# Patient Record
Sex: Female | Born: 1955
Health system: Southern US, Community
[De-identification: ages and names within clinical notes are randomized; demographics above are authoritative.]

## PROBLEM LIST (undated history)

## (undated) DIAGNOSIS — M8580 Other specified disorders of bone density and structure, unspecified site: Secondary | ICD-10-CM

## (undated) DIAGNOSIS — R3129 Other microscopic hematuria: Secondary | ICD-10-CM

## (undated) DIAGNOSIS — E559 Vitamin D deficiency, unspecified: Secondary | ICD-10-CM

## (undated) DIAGNOSIS — Z9889 Other specified postprocedural states: Secondary | ICD-10-CM

## (undated) DIAGNOSIS — F419 Anxiety disorder, unspecified: Secondary | ICD-10-CM

## (undated) DIAGNOSIS — M12811 Other specific arthropathies, not elsewhere classified, right shoulder: Secondary | ICD-10-CM

## (undated) DIAGNOSIS — I10 Essential (primary) hypertension: Secondary | ICD-10-CM

## (undated) DIAGNOSIS — T8859XA Other complications of anesthesia, initial encounter: Secondary | ICD-10-CM

## (undated) DIAGNOSIS — M4310 Spondylolisthesis, site unspecified: Secondary | ICD-10-CM

## (undated) HISTORY — DX: Spondylolisthesis, site unspecified: M43.10

## (undated) HISTORY — DX: Other specific arthropathies, not elsewhere classified, right shoulder: M12.811

## (undated) HISTORY — DX: Vitamin D deficiency, unspecified: E55.9

## (undated) HISTORY — DX: Other specified disorders of bone density and structure, unspecified site: M85.80

## (undated) HISTORY — DX: Other microscopic hematuria: R31.29

---

## 1989-10-14 HISTORY — PX: TUBAL LIGATION: SHX77

## 1999-01-17 ENCOUNTER — Encounter: Admission: RE | Admit: 1999-01-17 | Discharge: 1999-04-13 | Payer: Self-pay | Admitting: Family Medicine

## 2001-11-19 ENCOUNTER — Encounter: Payer: Self-pay | Admitting: Otolaryngology

## 2001-11-19 ENCOUNTER — Encounter: Admission: RE | Admit: 2001-11-19 | Discharge: 2001-11-19 | Payer: Self-pay | Admitting: Otolaryngology

## 2001-11-26 ENCOUNTER — Other Ambulatory Visit: Admission: RE | Admit: 2001-11-26 | Discharge: 2001-11-26 | Payer: Self-pay | Admitting: Obstetrics and Gynecology

## 2003-01-17 ENCOUNTER — Other Ambulatory Visit: Admission: RE | Admit: 2003-01-17 | Discharge: 2003-01-17 | Payer: Self-pay | Admitting: Obstetrics and Gynecology

## 2003-08-15 DIAGNOSIS — R3129 Other microscopic hematuria: Secondary | ICD-10-CM

## 2003-08-15 HISTORY — DX: Other microscopic hematuria: R31.29

## 2003-11-10 ENCOUNTER — Other Ambulatory Visit: Admission: RE | Admit: 2003-11-10 | Discharge: 2003-11-10 | Payer: Self-pay | Admitting: Obstetrics and Gynecology

## 2005-08-15 ENCOUNTER — Other Ambulatory Visit: Admission: RE | Admit: 2005-08-15 | Discharge: 2005-08-15 | Payer: Self-pay | Admitting: Obstetrics and Gynecology

## 2005-10-04 ENCOUNTER — Ambulatory Visit (HOSPITAL_COMMUNITY): Admission: RE | Admit: 2005-10-04 | Discharge: 2005-10-04 | Payer: Self-pay | Admitting: Gastroenterology

## 2005-11-13 ENCOUNTER — Ambulatory Visit (HOSPITAL_COMMUNITY): Admission: RE | Admit: 2005-11-13 | Discharge: 2005-11-13 | Payer: Self-pay | Admitting: Sports Medicine

## 2005-12-12 HISTORY — PX: ROTATOR CUFF REPAIR: SHX139

## 2005-12-25 ENCOUNTER — Ambulatory Visit (HOSPITAL_BASED_OUTPATIENT_CLINIC_OR_DEPARTMENT_OTHER): Admission: RE | Admit: 2005-12-25 | Discharge: 2005-12-26 | Payer: Self-pay | Admitting: Orthopedic Surgery

## 2006-05-15 ENCOUNTER — Emergency Department (HOSPITAL_COMMUNITY): Admission: EM | Admit: 2006-05-15 | Discharge: 2006-05-15 | Payer: Self-pay | Admitting: Family Medicine

## 2006-08-18 ENCOUNTER — Other Ambulatory Visit: Admission: RE | Admit: 2006-08-18 | Discharge: 2006-08-18 | Payer: Self-pay | Admitting: Obstetrics & Gynecology

## 2006-11-07 ENCOUNTER — Emergency Department (HOSPITAL_COMMUNITY): Admission: EM | Admit: 2006-11-07 | Discharge: 2006-11-07 | Payer: Self-pay | Admitting: Family Medicine

## 2007-03-10 ENCOUNTER — Encounter: Admission: RE | Admit: 2007-03-10 | Discharge: 2007-05-07 | Payer: Self-pay | Admitting: Internal Medicine

## 2007-11-07 ENCOUNTER — Emergency Department (HOSPITAL_COMMUNITY): Admission: EM | Admit: 2007-11-07 | Discharge: 2007-11-07 | Payer: Self-pay | Admitting: Family Medicine

## 2007-11-30 ENCOUNTER — Other Ambulatory Visit: Admission: RE | Admit: 2007-11-30 | Discharge: 2007-11-30 | Payer: Self-pay | Admitting: Obstetrics and Gynecology

## 2007-12-02 ENCOUNTER — Ambulatory Visit (HOSPITAL_COMMUNITY): Admission: RE | Admit: 2007-12-02 | Discharge: 2007-12-02 | Payer: Self-pay | Admitting: Orthopedic Surgery

## 2008-11-23 ENCOUNTER — Emergency Department (HOSPITAL_COMMUNITY): Admission: EM | Admit: 2008-11-23 | Discharge: 2008-11-23 | Payer: Self-pay | Admitting: Family Medicine

## 2009-02-06 ENCOUNTER — Other Ambulatory Visit: Admission: RE | Admit: 2009-02-06 | Discharge: 2009-02-06 | Payer: Self-pay | Admitting: Obstetrics and Gynecology

## 2009-11-13 ENCOUNTER — Emergency Department (HOSPITAL_COMMUNITY): Admission: EM | Admit: 2009-11-13 | Discharge: 2009-11-13 | Payer: Self-pay | Admitting: Emergency Medicine

## 2010-12-30 LAB — URINE CULTURE: Colony Count: 25000

## 2010-12-30 LAB — POCT URINALYSIS DIP (DEVICE)
Bilirubin Urine: NEGATIVE
Glucose, UA: 100 mg/dL — AB
Ketones, ur: NEGATIVE mg/dL
Nitrite: POSITIVE — AB
Protein, ur: 30 mg/dL — AB
Specific Gravity, Urine: <=1.005 (ref 1.005–1.030)
Urobilinogen, UA: 1 mg/dL (ref 0.0–1.0)
pH: 6 (ref 5.0–8.0)

## 2011-03-01 NOTE — Op Note (Signed)
Lisa Benjamin, EBRAHIMI              ACCOUNT NO.:  [id number]   MEDICAL RECORD NO.:  [id number]          PATIENT TYPE:  AMB   LOCATION:  ENDO                         FACILITY:  [facility]   PHYSICIAN:  Anselmo Rod, M.D.  DATE OF BIRTH:  05-31-1956   DATE OF PROCEDURE:  10/04/2005  DATE OF DISCHARGE:  10/04/2005                                 OPERATIVE REPORT   PROCEDURE PERFORMED:  Screening colonoscopy.   ENDOSCOPIST:  Anselmo Rod, M.D.   INSTRUMENT USED:  Olympus video colonoscope.   INDICATION FOR PROCEDURE:  A 55 year old African-American female undergoing  screening colonoscopy to rule out colonic polyps, masses, etc.  The patient  has a questionable history of colon cancer in the maternal grandmother and  family history of breast cancer in a maternal aunt.   PREPROCEDURE PREPARATION:  Informed consent was procured from the patient.  The patient was fasted for four hours prior to the procedure and prepped  with OsmoPrep pills the night of and in the morning of the procedure.  The  risks and benefits of the procedure, including a 10% miss rate of cancer and  polyps, were discussed with the patient as well.   PREPROCEDURE PHYSICAL:  VITAL SIGNS:  The patient had stable vital signs.  NECK:  Supple.  CHEST:  Clear to auscultation.  S1, S2 regular.  ABDOMEN:  Soft with normal bowel sounds.   DESCRIPTION OF PROCEDURE:  The patient was placed in the left lateral  decubitus position and sedated with 75 mcg of fentanyl and 8 mg of Versed in  slow incremental doses.  Once the patient was adequately sedate and  maintained on low-flow oxygen and continuous cardiac monitoring, the Olympus  video colonoscope was advanced from the rectum to the cecum.  The  appendiceal orifice and the ileocecal valve were clearly visualized.  The  entire colonic mucosa appeared healthy with no evidence of diverticulosis,  masses or polyps.   IMPRESSION:  Normal colonoscopy up to the terminal ileum.   No masses, polyps  or diverticula were seen.  Retroflexion in the rectum revealed no  abnormality.   RECOMMENDATIONS:  1.  Continue a high-fiber diet with liberal fluid intake.  2.  A repeat colonoscopy has been recommended within the next five years      unless the patient develops any abnormal symptoms in the interim, in      which case she should contact the office immediately.  3.  Outpatient follow-up as the need arises in the future.      Anselmo Rod, M.D.  Electronically Signed     JNM/MEDQ  D:  10/07/2005  T:  10/08/2005  Job:  [id number]   cc:   Edwena Felty. Romine, M.D.  Fax: [phone]

## 2011-03-01 NOTE — Op Note (Signed)
NAMEMAELANI, YARBRO NO.:  [id number]   MEDICAL RECORD NO.:  [id number]          PATIENT TYPE:  AMB   LOCATION:  DSC                          FACILITY:  [facility]   PHYSICIAN:  Loreta Ave, M.D. 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   PROCEDURE:  Left shoulder exam, manipulation under anesthesia.  Arthroscopy  with debridement of intra- and extra-articular adhesions.  Debridement of  labral tear.  Acromioplasty with CA ligament release.  Excision of distal  clavicle.   SURGEON:  Loreta Ave, M.D.   ASSISTANT:  Zonia Kief, P.A.   ANESTHESIA:  General.   BLOOD LOSS:  Minimal.   SPECIMENS:  None.   CULTURES:  None.   COMPLICATIONS:  None.   DRESSING:  Soft compressive with shoulder immobilizer.   PROCEDURE:  The patient was brought to the operating room and placed on the  operating table in the supine position.  After adequate anesthesia had been  obtained, the left shoulder was examined.  A good 50% loss of motion in all  planes.  Dense adhesions.  With bringing her past 90 degrees of forward  flexion and abduction, a pronounced breaking of adhesions in a single  manner.  Once that was achieved, I had just about full motion.  Still a  little bit of tightness with rotation that was achieved by breaking up more  adhesions and then full rotation obtained.  Maintained a stable shoulder.   Placed in the beach-chair position on a shoulder positioner and prepped and  draped in the usual sterile fashion.  Three standard portals, anterior,  posterior and lateral.  Shoulder entered from posterior approach.  Arthroscope introduced.  Shoulder distended and inspected.  Intra-articular  adhesions, synovitis debrided.  Capsular tearing from manipulation debrided.  Articular cartilage only grade 1 to 2 changes.  Posterior labral tear  debrided.  Undersurface of rotator cuff and biceps tendon intact.  Cannula  redirected subacromially.  Type II to III acromion, obvious impingement and  subacromial adhesions.  Cuff debrided.  No full-thickness tears.  Adhesions  removed.  Acromioplasty __________ acromion with shaver and high-speed bur,  releasing the CA ligament with cautery.  Distal clavicle grade 3 and 4  changes.  Lateral centimeter resected.  Adequacy of decompression and  clavicle excision confirmed viewing from all portals.  Instruments and fluid  removed.  Portals, shoulder and bursa injected with Marcaine.  Portals with  4-0 nylon.  Sterile compression dressing applied.  Sling applied.  Anesthesia reversed.  Brought to recovery room.  Tolerated surgery well with  no complications.      Loreta Ave, M.D.  Electronically Signed     DFM/MEDQ  D:  12/25/2005  T:  12/26/2005  Job:  [id number] 

## 2011-09-30 ENCOUNTER — Ambulatory Visit
Admission: RE | Admit: 2011-09-30 | Discharge: 2011-09-30 | Disposition: A | Discharge status: Home / Self Care | Payer: BC Managed Care – PPO | Source: Ambulatory Visit | Attending: Family Medicine | Admitting: Family Medicine

## 2011-09-30 ENCOUNTER — Other Ambulatory Visit: Payer: Self-pay | Admitting: Family Medicine

## 2011-09-30 DIAGNOSIS — R059 Cough, unspecified: Secondary | ICD-10-CM

## 2011-09-30 DIAGNOSIS — R05 Cough: Secondary | ICD-10-CM

## 2012-12-02 ENCOUNTER — Emergency Department (HOSPITAL_COMMUNITY)
Admission: EM | Admit: 2012-12-02 | Discharge: 2012-12-02 | Disposition: A | Discharge status: Home / Self Care | Payer: 59 | Source: Home / Self Care

## 2012-12-02 ENCOUNTER — Encounter (HOSPITAL_COMMUNITY): Payer: Self-pay

## 2012-12-02 DIAGNOSIS — N390 Urinary tract infection, site not specified: Secondary | ICD-10-CM

## 2012-12-02 LAB — POCT URINALYSIS DIP (DEVICE)
Bilirubin Urine: NEGATIVE
Glucose, UA: NEGATIVE mg/dL
Ketones, ur: NEGATIVE mg/dL
Nitrite: NEGATIVE
Protein, ur: NEGATIVE mg/dL
Specific Gravity, Urine: 1.01 (ref 1.005–1.030)
Urobilinogen, UA: 0.2 mg/dL (ref 0.0–1.0)
pH: 7 (ref 5.0–8.0)

## 2012-12-02 MED ORDER — CEPHALEXIN 250 MG PO CAPS: 250.0000 mg | ORAL_CAPSULE | Freq: Four times a day (QID) | ORAL | Status: DC

## 2012-12-02 NOTE — ED Provider Notes (Signed)
History     CSN: [id number]  Arrival date & time 12/02/12  1106   First MD Initiated Contact with Patient 12/02/12 1146      Chief Complaint  Patient presents with  . Urinary Tract Infection    (Consider location/radiation/quality/duration/timing/severity/associated sxs/prior treatment) HPI Comments: 57 year old female complaining of "UTI symptoms" for 48 hours. Shuffling of urinary frequency, dysuria and upper left back pain. She denies nausea, vomiting, fever, abdominal pain or pain in the suprapubic area. She has a history of UTIs with frequency of 3-4 per year. However, since approximately 2011 her UTIs have decreased significantly and number.  Patient is a 57 y.o. female presenting with urinary tract infection.  Urinary Tract Infection    History reviewed. No pertinent past medical history.  History reviewed. No pertinent past surgical history.  History reviewed. No pertinent family history.  History  Substance Use Topics  . Smoking status: Not on file  . Smokeless tobacco: Not on file  . Alcohol Use: Not on file    OB History   Grav Para Term Preterm Abortions TAB SAB Ect Mult Living                  Review of Systems  Constitutional: Negative.   HENT: Negative.   Respiratory: Negative.   Gastrointestinal: Negative.   Genitourinary: Positive for dysuria, frequency and flank pain. Negative for vaginal bleeding, vaginal discharge, difficulty urinating, vaginal pain and pelvic pain.  Musculoskeletal: Negative.   Psychiatric/Behavioral: Negative.     Allergies  Review of patient's allergies indicates no known allergies.  Home Medications   Current Outpatient Rx  Name  Route  Sig  Dispense  Refill  . estrogen, conjugated,-medroxyprogesterone (PREMPRO) 0.625-2.5 MG per tablet   Oral   Take 1 tablet by mouth daily.         . cephALEXin (KEFLEX) 250 MG capsule   Oral   Take 1 capsule (250 mg total) by mouth 4 (four) times daily.   28 capsule   0      BP 131/59  Pulse 58  Temp(Src) 98 F (36.7 C) (Oral)  Resp 20  SpO2 100%  Physical Exam  Nursing note and vitals reviewed. Constitutional: She is oriented to person, place, and time. She appears well-developed and well-nourished. No distress.  Eyes: EOM are normal.  Neck: Normal range of motion. Neck supple.  Cardiovascular: Normal rate and regular rhythm.  Exam reveals gallop.   S4 gallop. States she has been told of a heart murmur and her physician is aware  Pulmonary/Chest: Effort normal and breath sounds normal. No respiratory distress. She has no wheezes. She has no rales.  Abdominal: Soft. She exhibits no distension. There is no tenderness. There is no rebound.  Musculoskeletal: Normal range of motion. She exhibits no edema.  Neurological: She is oriented to person, place, and time. She exhibits normal muscle tone.  Skin: Skin is warm and dry.  Psychiatric: She has a normal mood and affect.    ED Course  Procedures (including critical care time)  Labs Reviewed  POCT URINALYSIS DIP (DEVICE) - Abnormal; Notable for the following:    Hgb urine dipstick MODERATE (*)    Leukocytes, UA LARGE (*)    All other components within normal limits   No results found.   1. UTI (lower urinary tract infection)       MDM  Keflex 250 mg 4 times a day for 7 days Drink plenty of fluids stay well hydrated Pyridium, OTC one  to 2 tablets 3 times a day when necessary urinary discomfort For any worsening, new symptoms or problems such as fever, increased pain, chills, vomiting need to go to the emergency department. If no improvement may return Culture the urine Results for orders placed during the hospital encounter of 12/02/12  POCT URINALYSIS DIP (DEVICE)      Result Value Range   Glucose, UA NEGATIVE  NEGATIVE mg/dL   Bilirubin Urine NEGATIVE  NEGATIVE   Ketones, ur NEGATIVE  NEGATIVE mg/dL   Specific Gravity, Urine 1.010  1.005 - 1.030   Hgb urine dipstick MODERATE (*)  NEGATIVE   pH 7.0  5.0 - 8.0   Protein, ur NEGATIVE  NEGATIVE mg/dL   Urobilinogen, UA 0.2  0.0 - 1.0 mg/dL   Nitrite NEGATIVE  NEGATIVE   Leukocytes, UA LARGE (*) NEGATIVE           Hayden Rasmussen, NP 12/02/12 1442  Hayden Rasmussen, NP 12/02/12 1443

## 2012-12-02 NOTE — ED Notes (Signed)
C/o syx of UTI x 1 week; history of same in past; minimal relief w azo standard, vitamin c

## 2012-12-03 NOTE — ED Provider Notes (Signed)
Medical screening examination/treatment/procedure(s) were performed by non-physician practitioner and as supervising physician I was immediately available for consultation/collaboration.   [facility]; MD  Sharin Grave, MD 12/03/12 [phone]

## 2013-08-11 ENCOUNTER — Ambulatory Visit: Payer: PRIVATE HEALTH INSURANCE | Attending: Family Medicine | Admitting: Physical Therapy

## 2013-08-11 DIAGNOSIS — R293 Abnormal posture: Secondary | ICD-10-CM | POA: Insufficient documentation

## 2013-08-11 DIAGNOSIS — M25519 Pain in unspecified shoulder: Secondary | ICD-10-CM | POA: Insufficient documentation

## 2013-08-11 DIAGNOSIS — IMO0001 Reserved for inherently not codable concepts without codable children: Secondary | ICD-10-CM | POA: Insufficient documentation

## 2013-09-02 ENCOUNTER — Ambulatory Visit: Payer: PRIVATE HEALTH INSURANCE | Attending: Family Medicine | Admitting: Rehabilitation

## 2013-09-02 DIAGNOSIS — M25519 Pain in unspecified shoulder: Secondary | ICD-10-CM | POA: Insufficient documentation

## 2013-09-02 DIAGNOSIS — IMO0001 Reserved for inherently not codable concepts without codable children: Secondary | ICD-10-CM | POA: Insufficient documentation

## 2013-09-02 DIAGNOSIS — R293 Abnormal posture: Secondary | ICD-10-CM | POA: Insufficient documentation

## 2013-09-06 ENCOUNTER — Ambulatory Visit: Payer: PRIVATE HEALTH INSURANCE | Admitting: Physical Therapy

## 2013-09-08 ENCOUNTER — Ambulatory Visit: Payer: PRIVATE HEALTH INSURANCE | Admitting: Physical Therapy

## 2013-09-27 ENCOUNTER — Ambulatory Visit (INDEPENDENT_AMBULATORY_CARE_PROVIDER_SITE_OTHER): Payer: 59 | Admitting: Nurse Practitioner

## 2013-09-27 ENCOUNTER — Encounter: Payer: Self-pay | Admitting: Nurse Practitioner

## 2013-09-27 VITALS — BP 126/74 | HR 64 | Ht 63.0 in | Wt 175.0 lb

## 2013-09-27 DIAGNOSIS — Z01419 Encounter for gynecological examination (general) (routine) without abnormal findings: Secondary | ICD-10-CM

## 2013-09-27 DIAGNOSIS — Z0000 Encounter for general adult medical examination without abnormal findings: Secondary | ICD-10-CM

## 2013-09-27 DIAGNOSIS — E559 Vitamin D deficiency, unspecified: Secondary | ICD-10-CM

## 2013-09-27 LAB — POCT URINALYSIS DIPSTICK
Bilirubin, UA: NEGATIVE
Glucose, UA: NEGATIVE
Ketones, UA: NEGATIVE
Leukocytes, UA: NEGATIVE
Nitrite, UA: NEGATIVE
Protein, UA: NEGATIVE
Urobilinogen, UA: NEGATIVE
pH, UA: 5

## 2013-09-27 LAB — HEMOGLOBIN, FINGERSTICK: Hemoglobin, fingerstick: 12 g/dL (ref 12.0–16.0)

## 2013-09-27 MED ORDER — CONJ ESTROG-MEDROXYPROGEST ACE 0.625-2.5 MG PO TABS: 1.0000 | ORAL_TABLET | Freq: Every day | ORAL | Status: DC

## 2013-09-27 MED ORDER — ERGOCALCIFEROL 1.25 MG (50000 UT) PO CAPS: 50000.0000 [IU] | ORAL_CAPSULE | ORAL | Status: DC

## 2013-09-27 NOTE — Progress Notes (Signed)
Patient ID: Lisa Benjamin, female   DOB: 11/19/1955, 57 y.o.   MRN: [id number] 57 y.o. G2P2002 Married African American Fe here for annual exam.    No LMP recorded. Patient is postmenopausal.          Sexually active: yes  The current method of family planning is post menopausal status.    Exercising: no  The patient does not participate in regular exercise at present. Smoker:  no  Health Maintenance: Pap:  06/07/13, WNL with negative HR HPV MMG: 09/20/13, benign Colonoscopy:  2006, repeat in 10 years IFOB kit BMD: Never TDaP:  08/2004 Labs:  HB:  12.0 Urine:  small RBC, 5.0 pH   reports that she has never smoked. She has never used smokeless tobacco. She reports that she does not drink alcohol or use illicit drugs.  Past Medical History  Diagnosis Date  . Microscopic hematuria     negative work up with Dr. Isabel Caprice    Past Surgical History  Procedure Laterality Date  . Cesarean section      x2  . Tubal ligation Bilateral 1991  . Rotator cuff repair Left 12/2005    Current Outpatient Prescriptions  Medication Sig Dispense Refill  . calcium carbonate (TUMS EX) 750 MG chewable tablet Chew 1 tablet by mouth as needed for heartburn.      . ergocalciferol (VITAMIN D2) 50000 UNITS capsule Take 50,000 Units by mouth once a week.      . estrogen, conjugated,-medroxyprogesterone (PREMPRO) 0.625-2.5 MG per tablet Take 1 tablet by mouth daily.      . Multiple Vitamin (MULTIVITAMIN) tablet Take 1 tablet by mouth daily.       No current facility-administered medications for this visit.    Family History  Problem Relation Age of Onset  . Breast cancer Mother 61  . Diabetes Mother   . Heart failure Father     CHF  . COPD Father     ROS:  Pertinent items are noted in HPI.  Otherwise, a comprehensive ROS was negative.  Exam:   BP 126/74  Pulse 64  Ht 5\' 3"  (1.6 m)  Wt 175 lb (79.379 kg)  BMI 31.01 kg/m2 Height: 5\' 3"  (160 cm)  Ht Readings from Last 3 Encounters:  09/27/13 5\' 3"   (1.6 m)    General appearance: alert, cooperative and appears stated age Head: Normocephalic, without obvious abnormality, atraumatic Neck: no adenopathy, supple, symmetrical, trachea midline and thyroid normal to inspection and palpation Lungs: clear to auscultation bilaterally Breasts: normal appearance, no masses or tenderness Heart: regular rate and rhythm Abdomen: soft, non-tender; no masses,  no organomegaly Extremities: extremities normal, atraumatic, no cyanosis or edema Skin: Skin color, texture, turgor normal. No rashes or lesions Lymph nodes: Cervical, supraclavicular, and axillary nodes normal. No abnormal inguinal nodes palpated Neurologic: Grossly normal   Pelvic: External genitalia:  no lesions              Urethra:  normal appearing urethra with no masses, tenderness or lesions              Bartholin's and Skene's: normal                 Vagina: normal appearing vagina with normal color and discharge, no lesions              Cervix: anteverted              Pap taken: no Bimanual Exam:  Uterus:  normal size, contour, position, consistency,  mobility, non-tender              Adnexa: no mass, fullness, tenderness               Rectovaginal: Confirms               Anus:  normal sphincter tone, no lesions  A:  Well Woman with normal exam  Postmenopausal on HRT 06/2010  History of Vitamin D deficiency  P:   Pap smear as per guidelines   Mammogram due 12/15  Refill on HRT - Prempro for a year  Refill on Vit D for a year - follow with labs  IFOB given  Counseled on breast self exam, mammography screening, use and side effects of HRT, adequate intake of calcium and vitamin D, diet and exercise return annually or prn  An After Visit Summary was printed and given to the patient.

## 2013-09-27 NOTE — Patient Instructions (Signed)

## 2013-09-28 LAB — VITAMIN D 25 HYDROXY (VIT D DEFICIENCY, FRACTURES): Vit D, 25-Hydroxy: 57 ng/mL (ref 30–89)

## 2013-09-28 NOTE — Progress Notes (Signed)
Reviewed personally.  M. Suzanne Elimelech Houseman, MD.  

## 2013-09-29 ENCOUNTER — Telehealth: Payer: Self-pay | Admitting: *Deleted

## 2013-09-29 NOTE — Telephone Encounter (Signed)
Message copied by Luisa Dago on Wed Sep 29, 2013  9:56 AM ------      Message from: Ria Comment R      Created: Tue Sep 28, 2013  8:22 AM       Have her to reduce Vit D to every there wek.  If she goes to OTC she will go way down, as she has done this in the past ------

## 2013-09-29 NOTE — Telephone Encounter (Signed)
I have attempted to contact this patient by phone with the following results: left message to return my call on answering machine (mobile).  

## 2013-09-30 NOTE — Telephone Encounter (Signed)
Pt notified in result note.  

## 2014-06-02 ENCOUNTER — Emergency Department (HOSPITAL_COMMUNITY)
Admission: EM | Admit: 2014-06-02 | Discharge: 2014-06-02 | Disposition: A | Discharge status: Home / Self Care | Payer: 59 | Source: Home / Self Care | Attending: Emergency Medicine | Admitting: Emergency Medicine

## 2014-06-02 ENCOUNTER — Encounter (HOSPITAL_COMMUNITY): Payer: Self-pay | Admitting: Emergency Medicine

## 2014-06-02 DIAGNOSIS — M6740 Ganglion, unspecified site: Secondary | ICD-10-CM

## 2014-06-02 DIAGNOSIS — M67432 Ganglion, left wrist: Secondary | ICD-10-CM

## 2014-06-02 DIAGNOSIS — M25539 Pain in unspecified wrist: Secondary | ICD-10-CM

## 2014-06-02 DIAGNOSIS — M25532 Pain in left wrist: Secondary | ICD-10-CM

## 2014-06-02 MED ORDER — PREDNISONE 10 MG PO TABS: ORAL_TABLET | ORAL | Status: DC

## 2014-06-02 MED ORDER — DICLOFENAC EPOLAMINE 1.3 % TD PTCH: 1.0000 | MEDICATED_PATCH | Freq: Two times a day (BID) | TRANSDERMAL | Status: DC

## 2014-06-02 NOTE — ED Provider Notes (Signed)
CSN: [id number]     Arrival date & time 06/02/14  2010 History   First MD Initiated Contact with Patient 06/02/14 2050     Chief Complaint  Patient presents with  . Wrist Pain   (Consider location/radiation/quality/duration/timing/severity/associated sxs/prior Treatment) HPI Comments: 58 year old female presents complaining of left wrist pain. Starting this morning she began to have pain in her left wrist and she has noticed some slight swelling on the dorsum of the wrist. Throughout the day at work pain got slightly more intense. She denies any injury and does not know what she may have done to precipitate this. She denies numbness or redness, or any systemic symptoms. No personal or family history of arthritis. She has never had this before.  Patient is a 58 y.o. female presenting with wrist pain.  Wrist Pain    Past Medical History  Diagnosis Date  . Microscopic hematuria 11/04    negative work up with Dr. Isabel Caprice   Past Surgical History  Procedure Laterality Date  . Cesarean section      x2  . Tubal ligation Bilateral 1991  . Rotator cuff repair Left 12/2005   Family History  Problem Relation Age of Onset  . Breast cancer Mother 12  . Diabetes Mother   . Heart failure Father     CHF  . COPD Father    History  Substance Use Topics  . Smoking status: Never Smoker   . Smokeless tobacco: Never Used  . Alcohol Use: No   OB History   Grav Para Term Preterm Abortions TAB SAB Ect Mult Living   2 2 2       2      Review of Systems  Musculoskeletal: Positive for arthralgias and joint swelling.  All other systems reviewed and are negative.   Allergies  Review of patient's allergies indicates no known allergies.  Home Medications   Prior to Admission medications   Medication Sig Start Date End Date Taking? Authorizing Provider  calcium carbonate (TUMS EX) 750 MG chewable tablet Chew 1 tablet by mouth as needed for heartburn.    Historical Provider, MD  diclofenac  (FLECTOR) 1.3 % PTCH Place 1 patch onto the skin 2 (two) times daily. 06/02/14   Graylon Good, PA-C  ergocalciferol (VITAMIN D2) 50000 UNITS capsule Take 1 capsule (50,000 Units total) by mouth once a week. 09/27/13   Lauro Franklin, FNP  estrogen, conjugated,-medroxyprogesterone (PREMPRO) 0.625-2.5 MG per tablet Take 1 tablet by mouth daily. 09/27/13   Lauro Franklin, FNP  Multiple Vitamin (MULTIVITAMIN) tablet Take 1 tablet by mouth daily.    Historical Provider, MD  predniSONE (DELTASONE) 10 MG tablet 4 tabs PO QD for 4 days; 3 tabs PO QD for 3 days; 2 tabs PO QD for 2 days; 1 tab PO QD for 1 day 06/02/14   Graylon Good, PA-C   BP 134/61  Pulse 66  Temp(Src) 97.2 F (36.2 C) (Oral)  Resp 14  SpO2 100% Physical Exam  Nursing note and vitals reviewed. Constitutional: She is oriented to person, place, and time. Vital signs are normal. She appears well-developed and well-nourished. No distress.  HENT:  Head: Normocephalic and atraumatic.  Cardiovascular:  Pulses:      Radial pulses are 2+ on the right side, and 2+ on the left side.  Pulmonary/Chest: Effort normal. No respiratory distress.  Musculoskeletal:       Left wrist: She exhibits decreased range of motion (decreased extension), tenderness (Dorsum of the wrist) and swelling (  Focal area of swelling/cyst over the dorsum of the wrist). She exhibits no bony tenderness, no effusion, no crepitus, no deformity and no laceration.  Grip strength equal bilaterally. Negative Finkelstein's test  Neurological: She is alert and oriented to person, place, and time. She has normal strength. Coordination normal.  Skin: Skin is warm and dry. No rash noted. She is not diaphoretic.  Psychiatric: She has a normal mood and affect. Judgment normal.    ED Course  Procedures (including critical care time) Labs Review Labs Reviewed - No data to display  Imaging Review No results found.   MDM   1. Wrist pain, acute, left   2.  Ganglion cyst of wrist, left    This is consistent with an inflamed ganglion cyst. No indication of arthritis or infection. No injury to necessitate x-rays to evaluate for bony injury. We'll treat with bracing, oral prednisone, diclofenac patch. She will followup with her orthopedist if no improvement in 2 weeks.   Meds ordered this encounter  Medications  . predniSONE (DELTASONE) 10 MG tablet    Sig: 4 tabs PO QD for 4 days; 3 tabs PO QD for 3 days; 2 tabs PO QD for 2 days; 1 tab PO QD for 1 day    Dispense:  30 tablet    Refill:  0    Order Specific Question:  Supervising Provider    Answer:  Lorenz CoasterKELLER, DAVID C [id number]  . diclofenac (FLECTOR) 1.3 % PTCH    Sig: Place 1 patch onto the skin 2 (two) times daily.    Dispense:  30 patch    Refill:  0    Order Specific Question:  Supervising Provider    Answer:  Lorenz CoasterKELLER, DAVID C [[id number]       Graylon GoodZachary H Quiara Killian, PA-C 06/02/14 2153

## 2014-06-02 NOTE — ED Provider Notes (Signed)
Medical screening examination/treatment/procedure(s) were performed by non-physician practitioner and as supervising physician I was immediately available for consultation/collaboration.  Shayann Garbutt, M.D.  Zamier Eggebrecht C Niya Behler, MD 06/02/14 2254 

## 2014-06-02 NOTE — Discharge Instructions (Signed)
Wrist Pain Wrist injuries are frequent in adults and children. A sprain is an injury to the ligaments that hold your bones together. A strain is an injury to muscle or muscle cord-like structures (tendons) from stretching or pulling. Generally, when wrists are moderately tender to touch following a fall or injury, a break in the bone (fracture) may be present. Most wrist sprains or strains are better in 3 to 5 days, but complete healing may take several weeks. HOME CARE INSTRUCTIONS   Put ice on the injured area.  Put ice in a plastic bag.  Place a towel between your skin and the bag.  Leave the ice on for 15-20 minutes, 3-4 times a day, for the first 2 days, or as directed by your health care provider.  Keep your arm raised above the level of your heart whenever possible to reduce swelling and pain.  Rest the injured area for at least 48 hours or as directed by your health care provider.  If a splint or elastic bandage has been applied, use it for as long as directed by your health care provider or until seen by a health care provider for a follow-up exam.  Only take over-the-counter or prescription medicines for pain, discomfort, or fever as directed by your health care provider.  Keep all follow-up appointments. You may need to follow up with a specialist or have follow-up X-rays. Improvement in pain level is not a guarantee that you did not fracture a bone in your wrist. The only way to determine whether or not you have a broken bone is by X-ray. SEEK IMMEDIATE MEDICAL CARE IF:   Your fingers are swollen, very red, white, or cold and blue.  Your fingers are numb or tingling.  You have increasing pain.  You have difficulty moving your fingers. MAKE SURE YOU:   Understand these instructions.  Will watch your condition.  Will get help right away if you are not doing well or get worse. Document Released: 07/10/2005 Document Revised: 10/05/2013 Document Reviewed:  11/21/2010 [facility] Patient Information 2015 [place], [place]. This information is not intended to replace advice given to you by your health care provider. Make sure you discuss any questions you have with your health care provider.  Ganglion Cyst A ganglion cyst is a noncancerous, fluid-filled lump that occurs near joints or tendons. The ganglion cyst grows out of a joint or the lining of a tendon. It most often develops in the hand or wrist but can also develop in the shoulder, elbow, hip, knee, ankle, or foot. The round or oval ganglion can be pea sized or larger than a grape. Increased activity may enlarge the size of the cyst because more fluid starts to build up.  CAUSES  It is not completely known what causes a ganglion cyst to grow. However, it may be related to:  Inflammation or irritation around the joint.  An injury.  Repetitive movements or overuse.  Arthritis. SYMPTOMS  A lump most often appears in the hand or wrist, but can occur in other areas of the body. Generally, the lump is painless without other symptoms. However, sometimes pain can be felt during activity or when pressure is applied to the lump. The lump may even be tender to the touch. Tingling, pain, numbness, or muscle weakness can occur if the ganglion cyst presses on a nerve. Your grip may be weak and you may have less movement in your joints.  DIAGNOSIS  Ganglion cysts are most often diagnosed based on  a physical exam, noting where the cyst is and how it looks. Your caregiver will feel the lump and may shine a light alongside it. If it is a ganglion, a light often shines through it. Your caregiver may order an X-ray, ultrasound, or MRI to rule out other conditions. TREATMENT  Ganglions usually go away on their own without treatment. If pain or other symptoms are involved, treatment may be needed. Treatment is also needed if the ganglion limits your movement or if it gets infected. Treatment options include:  Wearing  a wrist or finger brace or splint.  Taking anti-inflammatory medicine.  Draining fluid from the lump with a needle (aspiration).  Injecting a steroid into the joint.  Surgery to remove the ganglion cyst and its stalk that is attached to the joint or tendon. However, ganglion cysts can grow back. HOME CARE INSTRUCTIONS   Do not press on the ganglion, poke it with a needle, or hit it with a heavy object. You may rub the lump gently and often. Sometimes fluid moves out of the cyst.  Only take medicines as directed by your caregiver.  Wear your brace or splint as directed by your caregiver. SEEK MEDICAL CARE IF:   Your ganglion becomes larger or more painful.  You have increased redness, red streaks, or swelling.  You have pus coming from the lump.  You have weakness or numbness in the affected area. MAKE SURE YOU:   Understand these instructions.  Will watch your condition.  Will get help right away if you are not doing well or get worse. Document Released: 09/27/2000 Document Revised: 06/24/2012 Document Reviewed: 11/24/2007 [facility] Patient Information 2015 [place]. This information is not intended to replace advice given to you by your health care provider. Make sure you discuss any questions you have with your health care provider.

## 2014-06-02 NOTE — ED Notes (Signed)
C/o left wrist pain States she has a knot on top of wrist which was noticed this afternoon States wrist is swollen

## 2014-06-25 ENCOUNTER — Emergency Department (HOSPITAL_COMMUNITY)
Admission: EM | Admit: 2014-06-25 | Discharge: 2014-06-25 | Disposition: A | Discharge status: Home / Self Care | Payer: 59 | Source: Home / Self Care | Attending: Family Medicine | Admitting: Family Medicine

## 2014-06-25 ENCOUNTER — Encounter (HOSPITAL_COMMUNITY): Payer: Self-pay | Admitting: Emergency Medicine

## 2014-06-25 DIAGNOSIS — J020 Streptococcal pharyngitis: Secondary | ICD-10-CM

## 2014-06-25 MED ORDER — AMOXICILLIN 500 MG PO CAPS: 500.0000 mg | ORAL_CAPSULE | Freq: Three times a day (TID) | ORAL | Status: DC

## 2014-06-25 MED ORDER — BENZOCAINE 20 % MT SOLN
OROMUCOSAL | Status: AC
  Filled 2014-06-25: qty 57

## 2014-06-25 NOTE — Discharge Instructions (Signed)
Drink lots of fluids, take all of medicine, use lozenges as needed.return if needed °

## 2014-06-25 NOTE — ED Provider Notes (Signed)
CSN: [id number]     Arrival date & time 06/25/14  1627 History   First MD Initiated Contact with Patient 06/25/14 1651     Chief Complaint  Patient presents with  . Sore Throat   (Consider location/radiation/quality/duration/timing/severity/associated sxs/prior Treatment) Patient is a 58 y.o. female presenting with pharyngitis. The history is provided by the patient.  Sore Throat This is a new problem. The current episode started more than 2 days ago (seen by lmd on thurs with neg strep test, culture pending, sx getting worse.). The problem has been gradually worsening. Associated symptoms comments: Right ear pain.. The symptoms are aggravated by swallowing.    Past Medical History  Diagnosis Date  . Microscopic hematuria 11/04    negative work up with Dr. Isabel Caprice   Past Surgical History  Procedure Laterality Date  . Cesarean section      x2  . Tubal ligation Bilateral 1991  . Rotator cuff repair Left 12/2005   Family History  Problem Relation Age of Onset  . Breast cancer Mother 10  . Diabetes Mother   . Heart failure Father     CHF  . COPD Father    History  Substance Use Topics  . Smoking status: Never Smoker   . Smokeless tobacco: Never Used  . Alcohol Use: No   OB History   Grav Para Term Preterm Abortions TAB SAB Ect Mult Living   Review of Systems  Constitutional: Negative.   HENT: Positive for ear pain and sore throat. Negative for ear discharge.     Allergies  Review of patient's allergies indicates no known allergies.  Home Medications   Prior to Admission medications   Medication Sig Start Date End Date Taking? Authorizing Provider  diclofenac (FLECTOR) 1.3 % PTCH Place 1 patch onto the skin 2 (two) times daily. 06/02/14  Yes Graylon Good, PA-C  ergocalciferol (VITAMIN D2) 50000 UNITS capsule Take 1 capsule (50,000 Units total) by mouth once a week. 09/27/13  Yes Patricia Rolen-Grubb, FNP  amoxicillin (AMOXIL) 500 MG capsule Take  1 capsule (500 mg total) by mouth 3 (three) times daily. 06/25/14   Linna Hoff, MD  calcium carbonate (TUMS EX) 750 MG chewable tablet Chew 1 tablet by mouth as needed for heartburn.    Historical Provider, MD  estrogen, conjugated,-medroxyprogesterone (PREMPRO) 0.625-2.5 MG per tablet Take 1 tablet by mouth daily. 09/27/13   Lauro Franklin, FNP  Multiple Vitamin (MULTIVITAMIN) tablet Take 1 tablet by mouth daily.    Historical Provider, MD  predniSONE (DELTASONE) 10 MG tablet 4 tabs PO QD for 4 days; 3 tabs PO QD for 3 days; 2 tabs PO QD for 2 days; 1 tab PO QD for 1 day 06/02/14   Graylon Good, PA-C   BP 143/73  Pulse 83  Temp(Src) 100.1 F (37.8 C) (Oral)  Resp 16  SpO2 98% Physical Exam  Nursing note and vitals reviewed. Constitutional: She is oriented to person, place, and time. She appears well-developed and well-nourished.  HENT:  Right Ear: External ear normal.  Left Ear: External ear normal.  Mouth/Throat: Uvula is midline and mucous membranes are normal. Oropharyngeal exudate and posterior oropharyngeal erythema present.  Eyes: Conjunctivae are normal. Pupils are equal, round, and reactive to light.  Neck: Normal range of motion. Neck supple.  Lymphadenopathy:    She has cervical adenopathy.  Neurological: She is alert and oriented to person, place, and  time.  Skin: Skin is warm and dry.    ED Course  Procedures (including critical care time) Labs Review Labs Reviewed - No data to display  Imaging Review No results found.   MDM   1. Strep sore throat        Linna Hoff, MD 06/25/14 [phone]

## 2014-06-25 NOTE — ED Notes (Signed)
C/o  Sore throat.  Ear ache.  Headache.  Pt has used ibuprofen and throat drops with no relief.  Denies n/v/d

## 2014-08-15 ENCOUNTER — Encounter (HOSPITAL_COMMUNITY): Payer: Self-pay | Admitting: Emergency Medicine

## 2014-10-04 ENCOUNTER — Encounter: Payer: Self-pay | Admitting: Nurse Practitioner

## 2014-10-04 ENCOUNTER — Ambulatory Visit (INDEPENDENT_AMBULATORY_CARE_PROVIDER_SITE_OTHER): Payer: 59 | Admitting: Nurse Practitioner

## 2014-10-04 VITALS — BP 122/78 | HR 60 | Ht 62.25 in | Wt 180.0 lb

## 2014-10-04 DIAGNOSIS — Z01419 Encounter for gynecological examination (general) (routine) without abnormal findings: Secondary | ICD-10-CM

## 2014-10-04 DIAGNOSIS — Z0000 Encounter for general adult medical examination without abnormal findings: Secondary | ICD-10-CM

## 2014-10-04 LAB — POCT URINALYSIS DIPSTICK
Bilirubin, UA: NEGATIVE
Glucose, UA: NEGATIVE
Ketones, UA: NEGATIVE
Nitrite, UA: NEGATIVE
Protein, UA: NEGATIVE
Urobilinogen, UA: NEGATIVE
pH, UA: 6

## 2014-10-04 NOTE — Patient Instructions (Signed)

## 2014-10-04 NOTE — Progress Notes (Signed)
Patient ID: Lisa Benjamin, female   DOB: [birth year], 58 y.o.   MRN: [id number] 58 y.o. G2P2002 Married African American Fe here for annual exam.  No new diagnosis.  Stopped HRT in January 2015 secondary to increase in varicose veins and concerns about DVT.  Wears support sock daily.  Patient's last menstrual period was 06/14/2010 (approximate).  Pt is postmenopausal.      Sexually active: yes  The current method of family planning is post menopausal status.  Exercising: no The patient does not participate in regular exercise at present. Smoker: no  Health Maintenance: Pap: 06/07/13, WNL with negative HR HPV MMG: 09/21/14, [facility], normal per patient Colonoscopy: 2006, repeat in 10 years BMD: Never TDaP: 08/2004 Labs:  PCP, will have PCP draw Vit D also  Urine:  Small RBC, trace leuk's   reports that she has never smoked. She has never used smokeless tobacco. She reports that she does not drink alcohol or use illicit drugs.  Past Medical History  Diagnosis Date  . Microscopic hematuria 11/04    negative work up with Dr. Isabel CapriceGrapey    Past Surgical History  Procedure Laterality Date  . Cesarean section      x2  . Tubal ligation Bilateral 1991  . Rotator cuff repair Left 12/2005    Current Outpatient Prescriptions  Medication Sig Dispense Refill  . ergocalciferol (VITAMIN D2) 50000 UNITS capsule Take 1 capsule (50,000 Units total) by mouth once a week. 30 capsule 3  . Multiple Vitamin (MULTIVITAMIN) tablet Take 1 tablet by mouth daily.     No current facility-administered medications for this visit.    Family History  Problem Relation Age of Onset  . Breast cancer Mother 3077  . Diabetes Mother   . Heart failure Father     CHF  . COPD Father     ROS:  Pertinent items are noted in HPI.  Otherwise, a comprehensive ROS was negative.  Exam:   BP 122/78 mmHg  Pulse 60  Ht 5' 2.25" (1.581 m)  Wt 180 lb (81.647 kg)  BMI 32.66 kg/m2  LMP 06/14/2010 (Approximate) Height:  5' 2.25" (158.1 cm)  Ht Readings from Last 3 Encounters:  10/04/14 5' 2.25" (1.581 m)  09/27/13 5\' 3"  (1.6 m)    General appearance: alert, cooperative and appears stated age Head: Normocephalic, without obvious abnormality, atraumatic Neck: no adenopathy, supple, symmetrical, trachea midline and thyroid normal to inspection and palpation Lungs: clear to auscultation bilaterally Breasts: normal appearance, no masses or tenderness Heart: regular rate and rhythm Abdomen: soft, non-tender; no masses,  no organomegaly Extremities: extremities normal, atraumatic, no cyanosis or edema Skin: Skin color, texture, turgor normal. No rashes or lesions Lymph nodes: Cervical, supraclavicular, and axillary nodes normal. No abnormal inguinal nodes palpated Neurologic: Grossly normal   Pelvic: External genitalia:  no lesions              Urethra:  normal appearing urethra with no masses, tenderness or lesions              Bartholin's and Skene's: normal                 Vagina: normal appearing vagina with normal color and discharge, no lesions              Cervix: anteverted              Pap taken: No. Bimanual Exam:  Uterus:  normal size, contour, position, consistency, mobility, non-tender  Adnexa: no mass, fullness, tenderness               Rectovaginal: Confirms               Anus:  normal sphincter tone, no lesions  A:  Well Woman with normal exam  Postmenopausal on HRT 06/2010 - 10/2013 History of Vitamin D deficiency  P:   Reviewed health and wellness pertinent to exam  Pap smear not taken today  Mammogram is due 12/16  Did not refill Vit D at this time - will get Vit D done at PCP in Feb. If low will send us a report - if normal may take OTC Vit D daily.  Counseled on breast self exam, mammography screening, adequate intake of calcium and vitamin D, diet and exercise, Kegel's exercises return annually or prn  An After Visit Summary was printed and given to the  patient.

## 2014-10-10 NOTE — Progress Notes (Signed)
Encounter reviewed by Dr. Brook Silva.  

## 2014-10-13 NOTE — Addendum Note (Signed)
Addended by: Francee PiccoloPHILLIPS, STEPHANIE C on: 10/13/2014 11:40 AM   Modules accepted: Orders, SmartSet

## 2014-12-29 ENCOUNTER — Encounter (HOSPITAL_COMMUNITY): Payer: Self-pay | Admitting: Family Medicine

## 2014-12-29 ENCOUNTER — Emergency Department (HOSPITAL_COMMUNITY)
Admission: EM | Admit: 2014-12-29 | Discharge: 2014-12-29 | Disposition: A | Discharge status: Home / Self Care | Payer: 59 | Source: Home / Self Care | Attending: Family Medicine | Admitting: Family Medicine

## 2014-12-29 DIAGNOSIS — R05 Cough: Secondary | ICD-10-CM

## 2014-12-29 DIAGNOSIS — J320 Chronic maxillary sinusitis: Secondary | ICD-10-CM

## 2014-12-29 DIAGNOSIS — R059 Cough, unspecified: Secondary | ICD-10-CM

## 2014-12-29 MED ORDER — AMOXICILLIN-POT CLAVULANATE 875-125 MG PO TABS: 1.0000 | ORAL_TABLET | Freq: Two times a day (BID) | ORAL | Status: DC

## 2014-12-29 MED ORDER — FLUTICASONE PROPIONATE 50 MCG/ACT NA SUSP: 2.0000 | Freq: Every day | NASAL | Status: DC

## 2014-12-29 MED ORDER — GUAIFENESIN-CODEINE 100-10 MG/5ML PO SOLN: 5.0000 mL | Freq: Four times a day (QID) | ORAL | Status: DC | PRN

## 2014-12-29 MED ORDER — IPRATROPIUM BROMIDE 0.06 % NA SOLN: 2.0000 | Freq: Four times a day (QID) | NASAL | Status: DC

## 2014-12-29 MED ORDER — BENZONATATE 100 MG PO CAPS: 100.0000 mg | ORAL_CAPSULE | Freq: Three times a day (TID) | ORAL | Status: DC | PRN

## 2014-12-29 NOTE — ED Provider Notes (Signed)
CSN: [id number]     Arrival date & time 12/29/14  0806 History   First MD Initiated Contact with Patient 12/29/14 [phone]     Chief Complaint  Patient presents with  . Cough   (Consider location/radiation/quality/duration/timing/severity/associated sxs/prior Treatment) HPI  Developed sore throat 4 days ago. Associated w/ intermittently productive cough, rhinorrhea, myalgias. Denies fevers. Mucinex DM and tylenol w/ little benefit. Unable to sleep due to cough. Sore throat has resolved. Getting worse.   Past Medical History  Diagnosis Date  . Microscopic hematuria 11/04    negative work up with Dr. Isabel CapriceGrapey   Past Surgical History  Procedure Laterality Date  . Cesarean section      x2  . Tubal ligation Bilateral 1991  . Rotator cuff repair Left 12/2005   Family History  Problem Relation Age of Onset  . Breast cancer Mother 3977  . Diabetes Mother   . Heart failure Father     CHF  . COPD Father    History  Substance Use Topics  . Smoking status: Never Smoker   . Smokeless tobacco: Never Used  . Alcohol Use: No   OB History    Gravida Para Term Preterm AB TAB SAB Ectopic Multiple Living   2 2 2       2      Review of Systems Per HPI with all other pertinent systems negative.   Allergies  Review of patient's allergies indicates no known allergies.  Home Medications   Prior to Admission medications   Medication Sig Start Date End Date Taking? Authorizing Provider  amoxicillin-clavulanate (AUGMENTIN) 875-125 MG per tablet Take 1 tablet by mouth 2 (two) times daily. 12/29/14   Ozella Rocksavid J Oyuki Hogan, MD  benzonatate (TESSALON PERLES) 100 MG capsule Take 1-2 capsules (100-200 mg total) by mouth 3 (three) times daily as needed for cough. 12/29/14   Ozella Rocksavid J Dessie Delcarlo, MD  ergocalciferol (VITAMIN D2) 50000 UNITS capsule Take 1 capsule (50,000 Units total) by mouth once a week. 09/27/13   Roanna BanningPatricia R Grubb, FNP  fluticasone (FLONASE) 50 MCG/ACT nasal spray Place 2 sprays into both nostrils at  bedtime. 12/29/14   Ozella Rocksavid J Caston Coopersmith, MD  guaiFENesin-codeine 100-10 MG/5ML syrup Take 5-10 mLs by mouth every 6 (six) hours as needed for cough. 12/29/14   Ozella Rocksavid J Schyler Butikofer, MD  ipratropium (ATROVENT) 0.06 % nasal spray Place 2 sprays into both nostrils 4 (four) times daily. 12/29/14   Ozella Rocksavid J Eathon Valade, MD  Multiple Vitamin (MULTIVITAMIN) tablet Take 1 tablet by mouth daily.    Historical Provider, MD   BP 105/76 mmHg  Pulse 69  Temp(Src) 97.9 F (36.6 C) (Oral)  Resp 16  SpO2 99%  LMP 06/14/2010 (Approximate) Physical Exam Physical Exam  Constitutional: She is oriented to person, place, and time. She appears well-developed and well-nourished. No distress.  HENT:  Head: frontal and maxillary sinuses tender to palpation, no cervical lymphadenopathy, pharyngeal cobblestoning, rhinorrhea Eyes: EOM are normal. Pupils are equal, round, and reactive to light.  Neck: Normal range of motion.  Cardiovascular: Normal rate.   No murmur heard. Pulmonary/Chest: Effort normal and breath sounds normal. No respiratory distress.  Abdominal: Soft. Bowel sounds are normal. She exhibits no distension.  Musculoskeletal: Normal range of motion. She exhibits no edema or tenderness.  Neurological: She is alert and oriented to person, place, and time.  Skin: Skin is warm. No rash noted. She is not diaphoretic.  Psychiatric: She has a normal mood and affect. Her behavior is normal. Judgment and thought  content normal.   ED Course  Procedures (including critical care time) Labs Review Labs Reviewed - No data to display  Imaging Review No results found.   MDM   1. Maxillary sinusitis, unspecified chronicity   2. Cough    Atrovent FLonase Motrin Robitussin AC Zyrtec Tessalon Start augmentin only if above therapy does not work and condintion continues to [employer] given and all questions answered  Shelly Flatten, MD Family Medicine 12/29/2014, 9:03 AM      Ozella Rocks,  MD 12/29/14 [phone]

## 2014-12-29 NOTE — Discharge Instructions (Signed)
Your symptoms of cough are likely from postnasal drip from nasal sinus irritation which is in part due to a virus. Please are using the nasal Atrovent and Flonase for relief of the symptoms. Please also consider taking a daily allergy pill such as Zyrtec and using Motrin 600 mg every 6 hours. Please use the Robitussin-AC only at night for the cough and use the Tessalon Perles during the daytime. Please only use the antibiotics if your symptoms continue to worsen and developed fevers over the next 24-48 hours.

## 2014-12-29 NOTE — ED Notes (Signed)
Cough and sore throat that started sunday

## 2015-11-03 DIAGNOSIS — H1131 Conjunctival hemorrhage, right eye: Secondary | ICD-10-CM | POA: Diagnosis not present

## 2015-11-07 DIAGNOSIS — E559 Vitamin D deficiency, unspecified: Secondary | ICD-10-CM | POA: Diagnosis not present

## 2015-11-07 DIAGNOSIS — Z0000 Encounter for general adult medical examination without abnormal findings: Secondary | ICD-10-CM | POA: Diagnosis not present

## 2015-11-16 DIAGNOSIS — Z1211 Encounter for screening for malignant neoplasm of colon: Secondary | ICD-10-CM | POA: Diagnosis not present

## 2015-11-16 DIAGNOSIS — Z800 Family history of malignant neoplasm of digestive organs: Secondary | ICD-10-CM | POA: Diagnosis not present

## 2015-12-05 DIAGNOSIS — H2513 Age-related nuclear cataract, bilateral: Secondary | ICD-10-CM | POA: Diagnosis not present

## 2015-12-05 DIAGNOSIS — H43393 Other vitreous opacities, bilateral: Secondary | ICD-10-CM | POA: Diagnosis not present

## 2015-12-29 MED FILL — PEG-3350 AND ELECTROLYTES S: 236 | 1 days supply | Qty: 4000 | Fill #0

## 2016-01-01 DIAGNOSIS — Z1211 Encounter for screening for malignant neoplasm of colon: Secondary | ICD-10-CM | POA: Diagnosis not present

## 2016-01-01 DIAGNOSIS — Z800 Family history of malignant neoplasm of digestive organs: Secondary | ICD-10-CM | POA: Diagnosis not present

## 2016-02-26 DIAGNOSIS — H109 Unspecified conjunctivitis: Secondary | ICD-10-CM | POA: Diagnosis not present

## 2016-10-09 ENCOUNTER — Encounter: Payer: Self-pay | Admitting: Nurse Practitioner

## 2016-10-09 ENCOUNTER — Ambulatory Visit (INDEPENDENT_AMBULATORY_CARE_PROVIDER_SITE_OTHER): Payer: 59 | Admitting: Nurse Practitioner

## 2016-10-09 VITALS — BP 120/72 | HR 76 | Ht 62.25 in | Wt 181.0 lb

## 2016-10-09 DIAGNOSIS — Z01419 Encounter for gynecological examination (general) (routine) without abnormal findings: Secondary | ICD-10-CM | POA: Diagnosis not present

## 2016-10-09 DIAGNOSIS — Z0000 Encounter for general adult medical examination without abnormal findings: Secondary | ICD-10-CM | POA: Diagnosis not present

## 2016-10-09 LAB — POCT URINALYSIS DIPSTICK
Bilirubin, UA: NEGATIVE
Glucose, UA: NEGATIVE
Ketones, UA: NEGATIVE
Nitrite, UA: NEGATIVE
Protein, UA: NEGATIVE
Urobilinogen, UA: NEGATIVE
pH, UA: 7

## 2016-10-09 MED ORDER — NITROFURANTOIN MONOHYD MACRO 100 MG PO CAPS: 100.0000 mg | ORAL_CAPSULE | Freq: Two times a day (BID) | ORAL | 0 refills | Status: DC

## 2016-10-09 NOTE — Patient Instructions (Addendum)

## 2016-10-09 NOTE — Progress Notes (Signed)
Patient ID: Lisa Benjamin, female   DOB: 1956-01-12, 60 y.o.   MRN: [id number]  60 y.o. G2P2002 Married  African American Fe here for annual exam.  Off HRT since 1/ 2015 secondary to increase varicose veins and concerns about DVT.  Last Vit D was 57 - 2 yrs ago.  No  New health problems.  Maybe slight dysuria today.  Patient's last menstrual period was 06/14/2010 (approximate).          Sexually active: Yes.    The current method of family planning is post menopausal status.    Exercising: patient does not get regular exercise Smoker:  no  Health Maintenance: Pap:  06/07/13, Negative with neg  HR HPV MMG: 09/26/15, Bi-Rads 2: Benign Findings Colonoscopy: 12/2015, Normal, repeat in 10 years BMD: Never TDaP: current with employer Shingles: Never, did not have chicken pox, but had weak titer Pneumonia: Not indicated due to age Hep C and HIV: done today Labs: PCP takes care of all labs  Urine: 1+ leuk's, small RBC's   reports that she has never smoked. She has never used smokeless tobacco. She reports that she does not drink alcohol or use drugs.  Past Medical History:  Diagnosis Date  . Microscopic hematuria 11/04   negative work up with Dr. Isabel CapriceGrapey    Past Surgical History:  Procedure Laterality Date  . CESAREAN SECTION     x2  . ROTATOR CUFF REPAIR Left 12/2005  . TUBAL LIGATION Bilateral 1991    Current Outpatient Prescriptions  Medication Sig Dispense Refill  . amoxicillin-clavulanate (AUGMENTIN) 875-125 MG per tablet Take 1 tablet by mouth 2 (two) times daily. 20 tablet 0  . benzonatate (TESSALON PERLES) 100 MG capsule Take 1-2 capsules (100-200 mg total) by mouth 3 (three) times daily as needed for cough. 30 capsule 0  . ergocalciferol (VITAMIN D2) 50000 UNITS capsule Take 1 capsule (50,000 Units total) by mouth once a week. 30 capsule 3  . fluticasone (FLONASE) 50 MCG/ACT nasal spray Place 2 sprays into both nostrils at bedtime. 16 g 0  . guaiFENesin-codeine 100-10 MG/5ML  syrup Take 5-10 mLs by mouth every 6 (six) hours as needed for cough. 120 mL 0  . ipratropium (ATROVENT) 0.06 % nasal spray Place 2 sprays into both nostrils 4 (four) times daily. 15 mL 12  . Multiple Vitamin (MULTIVITAMIN) tablet Take 1 tablet by mouth daily.     No current facility-administered medications for this visit.     Family History  Problem Relation Age of Onset  . Breast cancer Mother 5577  . Diabetes Mother   . Heart failure Father     CHF  . COPD Father     ROS:  Pertinent items are noted in HPI.  Otherwise, a comprehensive ROS was negative.  Exam:   LMP 06/14/2010 (Approximate)    Ht Readings from Last 3 Encounters:  10/04/14 5' 2.25" (1.581 m)  09/27/13 5\' 3"  (1.6 m)    General appearance: alert, cooperative and appears stated age Head: Normocephalic, without obvious abnormality, atraumatic Neck: no adenopathy, supple, symmetrical, trachea midline and thyroid normal to inspection and palpation Lungs: clear to auscultation bilaterally Breasts: normal appearance, no masses or tenderness Heart: regular rate and rhythm Abdomen: soft, non-tender; no masses,  no organomegaly Extremities: extremities normal, atraumatic, no cyanosis or edema Skin: Skin color, texture, turgor normal. No rashes or lesions Lymph nodes: Cervical, supraclavicular, and axillary nodes normal. No abnormal inguinal nodes palpated Neurologic: Grossly normal   Pelvic: External genitalia:  no lesions              Urethra:  normal appearing urethra with no masses, tenderness or lesions              Bartholin's and Skene's: normal                 Vagina: normal appearing vagina with normal color and discharge, no lesions              Cervix: anteverted              Pap taken: Yes.   Bimanual Exam:  Uterus:  normal size, contour, position, consistency, mobility, non-tender              Adnexa: no mass, fullness, tenderness               Rectovaginal: Confirms               Anus:  normal  sphincter tone, no lesions  Chaperone present: yes  A:  Well Woman with normal exam  Postmenopausal on HRT 06/2010 - 10/2013 History of Vitamin D deficiency - now on OTC  Slight dysuria   P:   Reviewed health and wellness pertinent to exam  Pap smear as above  Mammogram is due 12/17  Macrobid 100 mg BID # 14 & will follow with urine culture and micro  Will get Hep C and HIV done at PCP and send results here to update Epic.  Counseled on breast self exam, mammography screening, adequate intake of calcium and vitamin D, diet and exercise, Kegel's exercises return annually or prn  An After Visit Summary was printed and given to the patient.

## 2016-10-10 ENCOUNTER — Ambulatory Visit: Payer: 59 | Admitting: Nurse Practitioner

## 2016-10-10 DIAGNOSIS — Z0000 Encounter for general adult medical examination without abnormal findings: Secondary | ICD-10-CM | POA: Diagnosis not present

## 2016-10-10 DIAGNOSIS — Z124 Encounter for screening for malignant neoplasm of cervix: Secondary | ICD-10-CM | POA: Diagnosis not present

## 2016-10-10 DIAGNOSIS — Z1151 Encounter for screening for human papillomavirus (HPV): Secondary | ICD-10-CM | POA: Diagnosis not present

## 2016-10-10 LAB — URINALYSIS, MICROSCOPIC ONLY
Bacteria, UA: NONE SEEN [HPF]
Casts: NONE SEEN [LPF]
Crystals: NONE SEEN [HPF]
WBC, UA: NONE SEEN WBC/HPF (ref <=5)
Yeast: NONE SEEN [HPF]

## 2016-10-10 LAB — URINE CULTURE

## 2016-10-12 NOTE — Progress Notes (Signed)
Encounter reviewed by Dr. Brook Amundson C. Silva.  

## 2016-10-13 LAB — IPS PAP TEST WITH HPV

## 2016-12-02 ENCOUNTER — Encounter: Payer: Self-pay | Admitting: Family

## 2016-12-02 ENCOUNTER — Ambulatory Visit: Payer: Self-pay | Admitting: Family

## 2016-12-02 VITALS — BP 140/80 | HR 85 | Temp 98.3°F

## 2016-12-02 DIAGNOSIS — H10022 Other mucopurulent conjunctivitis, left eye: Secondary | ICD-10-CM

## 2016-12-02 DIAGNOSIS — J019 Acute sinusitis, unspecified: Secondary | ICD-10-CM

## 2016-12-02 MED ORDER — AMOXICILLIN 875 MG PO TABS: 875.0000 mg | ORAL_TABLET | Freq: Two times a day (BID) | ORAL | 0 refills | Status: DC

## 2016-12-02 MED ORDER — CIPROFLOXACIN HCL 0.3 % OP SOLN: 1.0000 [drp] | OPHTHALMIC | Status: DC

## 2016-12-02 NOTE — Addendum Note (Signed)
Addended by: Mirian MoMOORE, Newman Waren A on: 12/02/2016 02:20 PM   Modules accepted: Orders

## 2016-12-02 NOTE — Progress Notes (Signed)
S/ 2 d hx eye drainage, mattering , irritated , no LOV or photophobia, left sinus pressure , not blowing , no fever   hx of herpetic eye infection , treated with valtrex ointment .  O/ alert pleasant NAD,VSS Eyes perrla , eoms full, Left conjunctiva very red ,lids swollen, no blisters seen  L tm retracted ,dull, with fluid Left max sinus ,Left frontal sinus tender ,nasal turbinates swollen L with mucopurulent D/c , throat clear Heart rsr lungs clear A/ conjunctivitis L   acute rhinosinusitis P/ rx cipro opthalmic , amoxicillan, No pt care x 48 hours  If sx not improving seek care by Eye Dr.

## 2016-12-06 DIAGNOSIS — H524 Presbyopia: Secondary | ICD-10-CM | POA: Diagnosis not present

## 2016-12-20 MED FILL — VALACYCLOVIR HCL 500 MG TAB: 500 | 7 days supply | Qty: 21 | Fill #0

## 2017-01-13 DIAGNOSIS — Z1231 Encounter for screening mammogram for malignant neoplasm of breast: Secondary | ICD-10-CM | POA: Diagnosis not present

## 2017-04-02 ENCOUNTER — Ambulatory Visit: Payer: Self-pay | Admitting: Physician Assistant

## 2017-04-02 ENCOUNTER — Encounter: Payer: Self-pay | Admitting: Physician Assistant

## 2017-04-02 VITALS — BP 130/80 | HR 70 | Temp 98.0°F

## 2017-04-02 DIAGNOSIS — M25511 Pain in right shoulder: Secondary | ICD-10-CM

## 2017-04-02 MED ORDER — BACLOFEN 10 MG PO TABS: 10.0000 mg | ORAL_TABLET | Freq: Three times a day (TID) | ORAL | 0 refills | Status: DC

## 2017-04-02 NOTE — Progress Notes (Signed)
S: c/o r shoulder pain, radiates to front of chest, no known injury, no sob or diaphoresis, still has a gallbladder but doesn't seem to be related to eating, pain is reproduced by moving the shoulder, no numbness or tingling  O: vitals wnl, nad, cspine is not tender, r shoulder is not tender, tender in levator scapula, and across r pec, lungs c ta , cv rrr  A: acute r shoulder  Pain  P: baclofen, otc advil, return if worsening, if cp sx then go to ER

## 2017-10-23 ENCOUNTER — Ambulatory Visit: Payer: 59 | Admitting: Obstetrics and Gynecology

## 2017-11-04 ENCOUNTER — Ambulatory Visit (INDEPENDENT_AMBULATORY_CARE_PROVIDER_SITE_OTHER): Payer: No Typology Code available for payment source | Admitting: Certified Nurse Midwife

## 2017-11-04 ENCOUNTER — Other Ambulatory Visit: Payer: Self-pay

## 2017-11-04 ENCOUNTER — Encounter: Payer: Self-pay | Admitting: Certified Nurse Midwife

## 2017-11-04 VITALS — BP 120/70 | HR 70 | Resp 16 | Ht 62.25 in | Wt 188.0 lb

## 2017-11-04 DIAGNOSIS — Z01419 Encounter for gynecological examination (general) (routine) without abnormal findings: Secondary | ICD-10-CM

## 2017-11-04 DIAGNOSIS — R3121 Asymptomatic microscopic hematuria: Secondary | ICD-10-CM | POA: Diagnosis not present

## 2017-11-04 DIAGNOSIS — N951 Menopausal and female climacteric states: Secondary | ICD-10-CM | POA: Diagnosis not present

## 2017-11-04 NOTE — Patient Instructions (Signed)

## 2017-11-04 NOTE — Progress Notes (Signed)
62 y.o. G2P2002 Married  African American Fe here for annual exam. Menopausal no HRT. Denies vaginal dryness or vaginal bleeding. Occasional hot flashes,and night sweats, no concerns.Some weight gain, due eating later at night and has restarted exercise. Sees PCP yearly and labs. All stable per patient. History of asymptomatic hematuria, does urine micro yearly. 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 No other health issues today.  Patient's last menstrual period was 06/14/2010 (approximate).          Sexually active: Yes.    The current method of family planning is post menopausal status.    Exercising: No.  exercise Smoker:  no  Health Maintenance: Pap:  06-07-13 neg HPV HR neg, 10-10-16 neg HPV HR neg History of Abnormal Pap: no  MMG:  01-13-17 category a density birads 1:neg Self Breast exams: yes Colonoscopy:  3/17 neg f/u 3123yrs BMD:   none TDaP:  UTD Shingles: no Pneumonia: no Hep C and HIV: not done Labs: with PCP   reports that  has never smoked. she has never used smokeless tobacco. She reports that she does not drink alcohol or use drugs.  Past Medical History:  Diagnosis Date  . Microscopic hematuria 11/04   negative work up with Dr. Isabel CapriceGrapey    Past Surgical History:  Procedure Laterality Date  . CESAREAN SECTION     x2  . ROTATOR CUFF REPAIR Left 12/2005  . TUBAL LIGATION Bilateral 1991    Current Outpatient Medications  Medication Sig Dispense Refill  . cholecalciferol (VITAMIN D) 1000 units tablet Take 1,000 Units by mouth daily.    . Multiple Vitamin (MULTIVITAMIN) tablet Take 1 tablet by mouth daily.     No current facility-administered medications for this visit.     Family History  Problem Relation Age of Onset  . Breast cancer Mother 7977  . Diabetes Mother   . Heart failure Father        CHF  . COPD Father   . Diabetes Brother   . Hypertension Sister        maybe some BP issues  . Diabetes Sister     ROS:  Pertinent items are noted in  HPI.  Otherwise, a comprehensive ROS was negative.  Exam:   BP 120/70   Pulse 70   Resp 16   Ht 5' 2.25" (1.581 m)   Wt 188 lb (85.3 kg)   LMP 06/14/2010 (Approximate)   BMI 34.11 kg/m  Height: 5' 2.25" (158.1 cm) Ht Readings from Last 3 Encounters:  11/04/17 5' 2.25" (1.581 m)  10/09/16 5' 2.25" (1.581 m)  10/04/14 5' 2.25" (1.581 m)    General appearance: alert, cooperative and appears stated age Head: Normocephalic, without obvious abnormality, atraumatic Neck: no adenopathy, supple, symmetrical, trachea midline and thyroid normal to inspection and palpation Lungs: clear to auscultation bilaterally Breasts: normal appearance, no masses or tenderness, No nipple retraction or dimpling, No nipple discharge or bleeding, No axillary or supraclavicular adenopathy Heart: regular rate and rhythm, slight murmur heard g1-2. Known and no change per patient Abdomen: soft, non-tender; no masses,  no organomegaly Extremities: extremities normal, atraumatic, no cyanosis or edema Skin: Skin color, texture, turgor normal. No rashes or lesions Lymph nodes: Cervical, supraclavicular, and axillary nodes normal. No abnormal inguinal nodes palpated Neurologic: Grossly normal   Pelvic: External genitalia:  no lesions, normal appearance              Urethra:  normal appearing urethra with no masses, tenderness  or lesions              Bartholin's and Skene's: normal                 Vagina: normal appearing vagina with normal color and discharge, no lesions              Cervix: no cervical motion tenderness, no lesions and normal appearance              Pap taken: No. Bimanual Exam:  Uterus:  normal size, contour, position, consistency, mobility, non-tender              Adnexa: normal adnexa and no mass, fullness, tenderness               Rectovaginal: Confirms               Anus:  normal sphincter tone, no lesions  Chaperone present: yes  A:  Well Woman with normal exam  Menopausal no  HRT  History of Microscopic hematuria with urology follow up if noted here  BMD due  P:   Reviewed health and wellness pertinent to exam  Discussed need to advise if vaginal bleeding  Lab Urine micro  Patient to schedule with mammogram in 4/19  Pap smear: no   counseled on breast self exam, mammography screening, feminine hygiene, adequate intake of calcium and vitamin D, diet and exercise  return annually or prn  An After Visit Summary was printed and given to the patient.

## 2017-11-04 NOTE — Addendum Note (Signed)
Addended by: Eliezer BottomJOHNSON, Happy Begeman J on: 11/04/2017 04:59 PM   Modules accepted: Orders

## 2017-11-05 LAB — URINALYSIS, MICROSCOPIC ONLY
Bacteria, UA: NONE SEEN
Casts: NONE SEEN /lpf
Epithelial Cells (non renal): >10 /hpf — AB (ref 0–10)

## 2017-11-12 ENCOUNTER — Telehealth: Payer: Self-pay

## 2017-11-12 NOTE — Telephone Encounter (Signed)
SENT REFERRAL TO SCHEDULING 

## 2017-11-19 ENCOUNTER — Other Ambulatory Visit: Payer: Self-pay | Admitting: Physician Assistant

## 2017-11-19 DIAGNOSIS — R5381 Other malaise: Secondary | ICD-10-CM

## 2017-11-25 ENCOUNTER — Other Ambulatory Visit: Payer: Self-pay | Admitting: Physician Assistant

## 2017-11-25 DIAGNOSIS — M8588 Other specified disorders of bone density and structure, other site: Secondary | ICD-10-CM

## 2017-11-27 ENCOUNTER — Other Ambulatory Visit: Payer: Self-pay | Admitting: Physician Assistant

## 2017-11-27 DIAGNOSIS — M8588 Other specified disorders of bone density and structure, other site: Secondary | ICD-10-CM

## 2017-12-04 ENCOUNTER — Telehealth: Payer: Self-pay | Admitting: *Deleted

## 2017-12-04 ENCOUNTER — Encounter: Payer: Self-pay | Admitting: Cardiology

## 2017-12-04 NOTE — Telephone Encounter (Signed)
NOTES FAXED TO [facility] °

## 2017-12-05 ENCOUNTER — Telehealth: Payer: Self-pay | Admitting: Cardiology

## 2017-12-05 NOTE — Telephone Encounter (Signed)
Received records from [facility] on 12/04/17, Appt 12/11/17 @ 10:00am. NV

## 2017-12-06 NOTE — Progress Notes (Signed)
Cardiology Office Note   Date:  12/11/2017   ID:  Lisa Benjamin, DOB 30-Jul-1956, MRN [id number]  PCP:  Laurann MontanaWhite, Cynthia, MD  Cardiologist:   Atif Chapple SwazilandJordan, MD   Chief Complaint  Patient presents with  . New Patient (Initial Visit)    Pt here today with no active complaints, following up on an abnormal EKG      History of Present Illness: Lisa PalmsRosemary Benjamin is a 62 y.o. female who is seen at the request of Dr. Cliffton AstersWhite for evaluation of a RBBB. She is a Engineer, civil (consulting)nurse at [employer]. She reports a history of a "functional" murmur in the past. She had an Echo in 1998 that was normal except for mild TR. Generally in excellent health. Was seen recently for physical and was noted on Ecg to have a RBBB. Apparently this is new. No complaints of chest pain, dyspnea, fatigue, dizziness or syncope. No palpitations, edema. Stays active.     Past Medical History:  Diagnosis Date  . Microscopic hematuria 11/04   negative work up with Dr. Isabel CapriceGrapey  . Vitamin D deficiency     Past Surgical History:  Procedure Laterality Date  . CESAREAN SECTION     x2  . ROTATOR CUFF REPAIR Left 12/2005  . TUBAL LIGATION Bilateral 1991     Current Outpatient Medications  Medication Sig Dispense Refill  . cholecalciferol (VITAMIN D) 1000 units tablet Take 1,000 Units by mouth daily.    . Multiple Vitamin (MULTIVITAMIN) tablet Take 1 tablet by mouth daily.     No current facility-administered medications for this visit.     Allergies:   Patient has no known allergies.    Social History:  The patient  reports that  has never smoked. she has never used smokeless tobacco. She reports that she does not drink alcohol or use drugs.   Family History:  The patient's family history includes Breast cancer (age of onset: 1177) in her mother; COPD in her father; Colon cancer in her maternal grandmother and mother; Diabetes in her brother, mother, and sister; Heart disease (age of onset: 9948) in her brother; Heart failure in her  father; Hypertension in her sister.    ROS:  Please see the history of present illness.   Otherwise, review of systems are positive for none.   All other systems are reviewed and negative.    PHYSICAL EXAM: VS:  BP (!) 156/78 (BP Location: Right Arm, Patient Position: Sitting, Cuff Size: Normal)   Pulse 70   Ht 5\' 2"  (1.575 m)   Wt 187 lb 3.2 oz (84.9 kg)   LMP 06/14/2010 (Approximate)   BMI 34.24 kg/m  , BMI Body mass index is 34.24 kg/m. GEN: Well nourished, well developed, in no acute distress  HEENT: normal  Neck: no JVD, carotid bruits, or masses Cardiac: RRR; there is a soft systolic murmur 1-2/6 LUSB. No rubs, or gallops,no edema  Respiratory:  clear to auscultation bilaterally, normal work of breathing GI: soft, nontender, nondistended, + BS MS: no deformity or atrophy  Skin: warm and dry, no rash Neuro:  Strength and sensation are intact Psych: euthymic mood, full affect   EKG:  EKG is ordered today. The ekg ordered today demonstrates NSR rate 70, RBBB. I have personally reviewed and interpreted this study.  EKG on 11/10/17 showed junctional rhythm with rate 64. RBBB. I have personally reviewed and interpreted this study.    Recent Labs: No results found for requested labs within last 8760 hours.  Lipid Panel No results found for: CHOL, TRIG, HDL, CHOLHDL, VLDL, LDLCALC, LDLDIRECT    Wt Readings from Last 3 Encounters:  12/11/17 187 lb 3.2 oz (84.9 kg)  11/04/17 188 lb (85.3 kg)  10/09/16 181 lb (82.1 kg)      Other studies Reviewed: Additional studies/ records that were reviewed today include:  Labs dated 11/10/17: cholesterol 167, triglycerides 74, HDL 58, LDL 94. Hgb 12.5. Creatinine 1.15.   ASSESSMENT AND PLAN:  1.  RBBB. This is a benign finding in an otherwise healthy female. No symptoms. No further work up required. 2. Junctional rhythm - noted on ECG on January 29 but not today. Again rate is normal and she is asymptomatic. To make sure she is  not having significant bradycardia will have her wear a 24 hour Holter monitor. If OK will reassure.    Current medicines are reviewed at length with the patient today.  The patient does not have concerns regarding medicines.  The following changes have been made:  no change  Labs/ tests ordered today include:   Orders Placed This Encounter  Procedures  . Holter monitor - 24 hour  . EKG 12-Lead     Disposition:   FU TBD. If Holter is OK will follow up PRN.  Signed, Lajoya Dombek [place], MD  12/11/2017 12:46 PM    [facility] [address] [place], [place], [address] Phone [phone], Fax [phone]

## 2017-12-11 ENCOUNTER — Ambulatory Visit (INDEPENDENT_AMBULATORY_CARE_PROVIDER_SITE_OTHER): Payer: No Typology Code available for payment source

## 2017-12-11 ENCOUNTER — Ambulatory Visit (INDEPENDENT_AMBULATORY_CARE_PROVIDER_SITE_OTHER): Payer: No Typology Code available for payment source | Admitting: Cardiology

## 2017-12-11 ENCOUNTER — Ambulatory Visit
Admission: RE | Admit: 2017-12-11 | Discharge: 2017-12-11 | Disposition: A | Discharge status: Home / Self Care | Payer: No Typology Code available for payment source | Source: Ambulatory Visit | Attending: Physician Assistant | Admitting: Physician Assistant

## 2017-12-11 ENCOUNTER — Encounter: Payer: Self-pay | Admitting: Cardiology

## 2017-12-11 VITALS — BP 156/78 | HR 70 | Ht 62.0 in | Wt 187.2 lb

## 2017-12-11 DIAGNOSIS — M8588 Other specified disorders of bone density and structure, other site: Secondary | ICD-10-CM

## 2017-12-11 DIAGNOSIS — I498 Other specified cardiac arrhythmias: Secondary | ICD-10-CM

## 2017-12-11 DIAGNOSIS — I4510 Unspecified right bundle-branch block: Secondary | ICD-10-CM

## 2017-12-11 NOTE — Patient Instructions (Signed)
We will have you wear a 24 hour Holter monitor.

## 2017-12-24 ENCOUNTER — Telehealth: Payer: Self-pay | Admitting: Cardiology

## 2017-12-24 NOTE — Telephone Encounter (Signed)
Patient informed. 

## 2017-12-24 NOTE — Telephone Encounter (Signed)
Mrs. Lisa Benjamin is calling about her monitor results. Please call

## 2017-12-24 NOTE — Telephone Encounter (Signed)
Result Notes for Holter monitor - 24 hour   Notes recorded by Charna ElizabethPugh, Cheryl Johnson D, LPN on 0/98/11913/09/2018 at 3:26 PM EDT Patient called. Left message for patient to call back. ------  Notes recorded by SwazilandJordan, Peter M, MD on 12/19/2017 at 7:33 AM EST Holter is very reassuring. No junctional rhythm is seen and normal HR range. No further evaluation needed.  Peter SwazilandJordan MD, [facility] Center[facility]ACC

## 2018-07-07 MED FILL — DICLOFENAC SODIUM 1% GEL: 1 | 8 days supply | Qty: 100 | Fill #0

## 2018-07-07 MED FILL — ONDANSETRON HCL 4 MG TABLET: 4 | 3 days supply | Qty: 10 | Fill #0

## 2018-07-16 MED FILL — DICLOFENAC SODIUM 1% GEL: 1 | 8 days supply | Qty: 100 | Fill #0

## 2018-07-16 MED FILL — ONDANSETRON HCL 4 MG TABLET: 4 | 3 days supply | Qty: 10 | Fill #0

## 2018-09-01 ENCOUNTER — Encounter: Payer: Self-pay | Admitting: Physician Assistant

## 2018-09-01 ENCOUNTER — Ambulatory Visit (INDEPENDENT_AMBULATORY_CARE_PROVIDER_SITE_OTHER): Payer: Self-pay | Admitting: Physician Assistant

## 2018-09-01 VITALS — BP 130/84 | HR 66 | Temp 98.2°F | Wt 171.0 lb

## 2018-09-01 DIAGNOSIS — H5789 Other specified disorders of eye and adnexa: Secondary | ICD-10-CM

## 2018-09-01 DIAGNOSIS — J069 Acute upper respiratory infection, unspecified: Secondary | ICD-10-CM

## 2018-09-01 DIAGNOSIS — R509 Fever, unspecified: Secondary | ICD-10-CM

## 2018-09-01 LAB — POCT INFLUENZA A/B
Influenza A, POC: NEGATIVE
Influenza B, POC: NEGATIVE

## 2018-09-01 MED ORDER — CARBOXYMETHYLCELLULOSE SODIUM 0.5 % OP SOLN: 1.0000 [drp] | Freq: Three times a day (TID) | OPHTHALMIC | 0 refills | Status: AC | PRN

## 2018-09-01 MED ORDER — BENZONATATE 200 MG PO CAPS: 200.0000 mg | ORAL_CAPSULE | Freq: Two times a day (BID) | ORAL | 0 refills | Status: DC | PRN

## 2018-09-01 MED ORDER — OXYMETAZOLINE HCL 0.05 % NA SOLN: 1.0000 | Freq: Two times a day (BID) | NASAL | 0 refills | Status: AC

## 2018-09-01 NOTE — Patient Instructions (Signed)
Thank you for choosing [facility] for your health care needs.  You have been diagnosed with an upper respiratory infection (a cold).  Recommend you increase fluids; water, tea or Gatorade. Rest. Take Tylenol or ibuprofen for pain/fever.  You have been prescribed Tessalon Perles for cough. You have been prescribed Afrin nasal spray for nasal congestion and post-nasal drip.  You have been prescribed lubricating eye drops for eye burning/irritation.  Continue over the counter Mucinex for congestion Use humidifier in bedroom. Sleep slightly elevated, using several pillows to prop yourself up.  Return to [facility] in 4-5 days if symptoms not improving. Hope you feel better soon.  Upper Respiratory Infection, Adult Most upper respiratory infections (URIs) are caused by a virus. A URI affects the nose, throat, and upper air passages. The most common type of URI is often called "the common cold." Follow these instructions at home:  Take medicines only as told by your doctor.  Gargle warm saltwater or take cough drops to comfort your throat as told by your doctor.  Use a warm mist humidifier or inhale steam from a shower to increase air moisture. This may make it easier to breathe.  Drink enough fluid to keep your pee (urine) clear or pale yellow.  Eat soups and other clear broths.  Have a healthy diet.  Rest as needed.  Go back to work when your fever is gone or your doctor says it is okay. ? You may need to stay home longer to avoid giving your URI to others. ? You can also wear a face mask and wash your hands often to prevent spread of the virus.  Use your inhaler more if you have asthma.  Do not use any tobacco products, including cigarettes, chewing tobacco, or electronic cigarettes. If you need help quitting, ask your doctor. Contact a doctor if:  You are getting worse, not better.  Your symptoms are not helped by medicine.  You have chills.  You are getting more  short of breath.  You have brown or red mucus.  You have yellow or brown discharge from your nose.  You have pain in your face, especially when you bend forward.  You have a fever.  You have puffy (swollen) neck glands.  You have pain while swallowing.  You have white areas in the back of your throat. Get help right away if:  You have very bad or constant: ? Headache. ? Ear pain. ? Pain in your forehead, behind your eyes, and over your cheekbones (sinus pain). ? Chest pain.  You have long-lasting (chronic) lung disease and any of the following: ? Wheezing. ? Long-lasting cough. ? Coughing up blood. ? A change in your usual mucus.  You have a stiff neck.  You have changes in your: ? Vision. ? Hearing. ? Thinking. ? Mood. This information is not intended to replace advice given to you by your health care provider. Make sure you discuss any questions you have with your health care provider. Document Released: 03/18/2008 Document Revised: 06/02/2016 Document Reviewed: 01/05/2014 Elsevier Interactive Patient Education  2018 [employer]

## 2018-09-01 NOTE — Progress Notes (Signed)
Patient ID: Lisa Benjamin DOB: 03/10/1956 AGE: 62 y.o. MRN: [id number]   PCP: Laurann Montana, MD   Chief Complaint:  Chief Complaint  Patient presents with  . Focus-bodyache/cough     Subjective:    HPI:  Lisa Benjamin is a 62 y.o. female presents for evaluation  Chief Complaint  Patient presents with  . Focus-bodyache/cough    62 year old female presents to [facility] with five day history of URI symptoms. Began with fatigue and sore throat. Sore throat worse the following day. Severe. Aggravated with swallowing. Sore throat then improved; patient developed nasal congestion, postnasal drip, and cough. Cough dry. Causing difficulty sleeping. Associated fever yesterday and day before. Tmax 101F. Has since resolved; last antipyretic yesterday, no fever today. Patient also with 2-3 days of bilateral eye discomfort. Describes as burning. Associated increased tear production. Denies eye injury/trauma, change in vision, foreign body sensation, photosensitivity, pruritis, purulent drainage, pain with eye movement. Patient wears glasses; no contact lens use. Denies dizziness/lightheadedness, headache, ear pain, neck pain, sinus pain, chest pain, SOB, wheezing, nausea/vomiting, diarrhea. Patient received this season's influenza vaccination, first week of October 2019. Patient with history of asthma as a child. Has not required daily inhaler or rescue inhaler for years.   A complete, at least 10 system review of symptoms was performed, pertinent positives and negatives as mentioned in HPI, otherwise negative.  The following portions of the patient's history were reviewed and updated as appropriate: allergies, current medications and past medical history.  There are no active problems to display for this patient.   No Known Allergies  Current Outpatient Medications on File Prior to Visit  Medication Sig Dispense Refill  . cholecalciferol (VITAMIN D) 1000 units tablet Take  1,000 Units by mouth daily.    . Multiple Vitamin (MULTIVITAMIN) tablet Take 1 tablet by mouth daily.     No current facility-administered medications on file prior to visit.        Objective:   Vitals:   09/01/18 1009  BP: 130/84  Pulse: 66  Temp: 98.2 F (36.8 C)  SpO2: 100%     Wt Readings from Last 3 Encounters:  09/01/18 171 lb (77.6 kg)  12/11/17 187 lb 3.2 oz (84.9 kg)  11/04/17 188 lb (85.3 kg)    Physical Exam:   General Appearance:  Alert, cooperative, appears stated age. In no acute distress. Afebrile.  Head:  Normocephalic, without obvious abnormality, atraumatic  Eyes:  PERRL, conjunctiva/corneas clear, currently no conjunctival erythema or injection, no perilimbal flushing, no chemosis. Patient frequently closing eyes for comfort. EOM's intact, fundi benign, both eyes  Ears:  Normal TM's and external ear canals, both ears  Nose: Nares normal, septum midline. No discharge. Nasal mucosa with minimal bilateral edema. No sinus tenderness with percussion/palpation.  Throat: Lips, mucosa, and tongue normal; teeth and gums normal. Throat reveals no erythema. Tonsils with no enlargement or exudate.  Neck: Supple, symmetrical, trachea midline, minimal bilateral anterior cervical lymphadenopathy;  thyroid: not enlarged, symmetric, no tenderness/mass/nodules; no carotid bruit or JVD  Back:   Symmetric, no curvature, ROM normal, no CVA tenderness  Lungs:   Clear to auscultation bilaterally, respirations unlabored. No wheezing, rales, rhonchi, or crackles. No cough elicited with forced expiration. No wheezing with forced expiration.  Heart:  Regular rate and rhythm, S1 and S2 normal, 3/6 systolic cardiac murmur (known to patient)  Abdomen:   Soft, non-tender, bowel sounds active all four quadrants,  no masses, no organomegaly  Extremities: Extremities normal, atraumatic, no  cyanosis or edema  Pulses: 2+ and symmetric  Skin: Skin color, texture, turgor normal, no rashes or  lesions  Lymph nodes: Cervical, supraclavicular, and axillary nodes normal  Neurologic: Normal    Assessment & Plan:    Exam findings, diagnosis etiology and medication use and indications reviewed with patient. Follow-Up and discharge instructions provided. No emergent/urgent issues found on exam.  Patient education was provided.   Patient verbalized understanding of information provided and agrees with plan of care (POC), all questions answered. The patient is advised to call or return to clinic if condition does not see an improvement in symptoms, or to seek the care of the closest emergency department if condition worsens with the below plan.    1. Upper respiratory tract infection, unspecified type  - benzonatate (TESSALON) 200 MG capsule; Take 1 capsule (200 mg total) by mouth 2 (two) times daily as needed for cough.  Dispense: 20 capsule; Refill: 0 - oxymetazoline (AFRIN NASAL SPRAY) 0.05 % nasal spray; Place 1 spray into both nostrils 2 (two) times daily for 4 days.  Dispense: 30 mL; Refill: 0  2. Eye irritation  - carboxymethylcellulose (REFRESH PLUS) 0.5 % SOLN; Place 1 drop into both eyes 3 (three) times daily as needed for up to 7 days.  Dispense: 30 mL; Refill: 0  3. Fever, unspecified fever cause  - POCT Influenza A/B  Patient with 5 day history of URI symptoms. NEGATIVE rapid strep test. Course consistent with self-limited viral URI. No red flags on history or physical examination. Advised symptomatic treatment. Prescribed Tessalon perles for cough, Afrin nasal spray for nasal congestion, and Refresh eye drops for eye burning/irritation. Gave patient two days off from work. Advised f/u in 4-5 days if not improving, sooner with worsening symptoms.  Janalyn HarderSamantha Shinita Mac, MHS, PA-C Rulon SeraSamantha F. Kaelin Holford, MHS, PA-C Advanced Practice Provider [facility]  [facility]  [address], [facility], [address]st [address] [place], [place] [address] (p):   [email or web address]

## 2018-09-04 ENCOUNTER — Telehealth: Payer: Self-pay | Admitting: Emergency Medicine

## 2018-09-04 NOTE — Telephone Encounter (Signed)
Left message for follow up call with visit with [facility]

## 2018-09-21 ENCOUNTER — Encounter: Payer: Self-pay | Admitting: Physician Assistant

## 2018-09-21 ENCOUNTER — Ambulatory Visit (INDEPENDENT_AMBULATORY_CARE_PROVIDER_SITE_OTHER): Payer: Self-pay | Admitting: Physician Assistant

## 2018-09-21 VITALS — BP 160/90 | HR 60 | Temp 97.9°F | Wt 173.0 lb

## 2018-09-21 DIAGNOSIS — M549 Dorsalgia, unspecified: Secondary | ICD-10-CM

## 2018-09-21 LAB — POCT URINALYSIS DIPSTICK
Bilirubin, UA: NEGATIVE
Blood, UA: 5
Glucose, UA: NEGATIVE
Ketones, UA: NEGATIVE
Leukocytes, UA: NEGATIVE
Nitrite, UA: NEGATIVE
Protein, UA: NEGATIVE
Spec Grav, UA: <=1.005 — AB (ref 1.010–1.025)
Urobilinogen, UA: 0.2 E.U./dL
pH, UA: 6 (ref 5.0–8.0)

## 2018-09-21 NOTE — Patient Instructions (Signed)
Thank you for choosing [facility] for your health care needs.  You have had 5 days of right flank and right mid/low back pain.  Microscopic hematuria on urinalysis performed in office (unsure if new or baseline).  Due to duration of symptoms with no clear evidence of musculoskeletal injury; recommend further evaluation at urgent care or ED.  Suspect you should have some further evaluation (possible imaging) for kidney/ureteral stone.  Kidney Stones Kidney stones (urolithiasis) are rock-like masses that form inside of the kidneys. Kidneys are organs that make pee (urine). A kidney stone can cause very bad pain and can block the flow of pee. The stone usually leaves your body (passes) through your pee. You may need to have a doctor take out the stone. Follow these instructions at home: Eating and drinking  Drink enough fluid to keep your pee clear or pale yellow. This will help you pass the stone.  If told by your doctor, change the foods you eat (your diet). This may include: ? Limiting how much salt (sodium) you eat. ? Eating more fruits and vegetables. ? Limiting how much meat, poultry, fish, and eggs you eat.  Follow instructions from your doctor about eating or drinking restrictions. General instructions  Collect pee samples as told by your doctor. You may need to collect a pee sample: ? 24 hours after a stone comes out. ? 8-12 weeks after a stone comes out, and every 6-12 months after that.  Strain your pee every time you pee (urinate), for as long as told. Use the strainer that your doctor recommends.  Do not throw out the stone. Keep it so that it can be tested by your doctor.  Take over-the-counter and prescription medicines only as told by your doctor.  Keep all follow-up visits as told by your doctor. This is important. You may need follow-up tests. Preventing kidney stones To prevent another kidney stone:  Drink enough fluid to keep your pee clear or pale yellow. This  is the best way to prevent kidney stones.  Eat healthy foods.  Avoid certain foods as told by your doctor. You may be told to eat less protein.  Stay at a healthy weight.  Contact a doctor if:  You have pain that gets worse or does not get better with medicine. Get help right away if:  You have a fever or chills.  You get very bad pain.  You get new pain in your belly (abdomen).  You pass out (faint).  You cannot pee. This information is not intended to replace advice given to you by your health care provider. Make sure you discuss any questions you have with your health care provider. Document Released: 03/18/2008 Document Revised: 06/18/2016 Document Reviewed: 06/18/2016 Elsevier Interactive Patient Education  2017 [employer]

## 2018-09-21 NOTE — Progress Notes (Signed)
Patient ID: Shanicqua Coldren DOB: 05-02-1956 AGE: 62 y.o. MRN: [id number]   PCP: Laurann Montana, MD   Chief Complaint:  Chief Complaint  Patient presents with  . Focus-right lower back pain    x5d     Subjective:    HPI:  Neta Upadhyay is a 62 y.o. female presents for evaluation  Chief Complaint  Patient presents with  . Focus-right lower back pain    x5d    62 year old female presents to [facility] with 5 day history of right flank pain. Began while at work. Was standing bedside next to patient. Not bending or lifting at the time of injury. Located at right flank. Radiated to left flank. Severe. Constant (though, with further discussion patient does report episodes of spasm). Improved with drinking significant amount of water. Became severe again, 9/10 in severity, on Saturday (3rd day of symptoms). Minimal improvement with OTC Tylenol and Aleve. Patient has not noticed any tenderness to palpation or pain with movement. Denies fever, chills, headache, chest pain, SOB, wheezing. Patient seen at [facility] one month ago with 5-day history of URI symptoms; sore throat and cough. Prescribed Tessalon Perles and Afrin nasal spray. Patient states dry cough has persisted; however, has improved. Denies abdominal pain, vomiting, suprapubic pressure, urinary urgency, dysuria, gross hematuria. Patient does report increased urinary frequency (suspects due to increased water consumption) and nausea (better today, was severe on Saturday, took 2 previously prescribed Zofran). Patient with no previous history of nephrolithisias/ureteral stone. Patient with known asymptomatic microscopic hematuria. Underwent evaluation by urologist, Dr. Isabel Caprice - negative work-up.   A complete, at least 10 system review of symptoms was performed, pertinent positives and negatives as mentioned in HPI.  The following portions of the patient's history were reviewed and updated as appropriate: allergies,  current medications and past medical history.  There are no active problems to display for this patient.   No Known Allergies  Current Outpatient Medications on File Prior to Visit  Medication Sig Dispense Refill  . calcium carbonate (TUMS - DOSED IN MG ELEMENTAL CALCIUM) 500 MG chewable tablet Chew 1 tablet by mouth daily.    . cholecalciferol (VITAMIN D) 1000 units tablet Take 1,000 Units by mouth daily.    . Multiple Vitamin (MULTIVITAMIN) tablet Take 1 tablet by mouth daily.     No current facility-administered medications on file prior to visit.        Objective:   Vitals:   09/21/18 1246  BP: (!) 160/90  Pulse: 60  Temp: 97.9 F (36.6 C)  SpO2: 99%     Wt Readings from Last 3 Encounters:  09/21/18 173 lb (78.5 kg)  09/01/18 171 lb (77.6 kg)  12/11/17 187 lb 3.2 oz (84.9 kg)    Physical Exam:   General Appearance:  Appears comfortable sitting on examination table, cooperative, appears stated age. In no acute distress. Afebrile.  Head:  Normocephalic, without obvious abnormality, atraumatic  Back:   Symmetric, no curvature, ROM normal (no pain with flexion or extension) very minimal discomfort with right lateral flexion, mild CVA tenderness with right sided percussion. No tenderness with palpation over back. Patient localizes pain to right flank and right upper portion of low back.  Lungs:   Clear to auscultation bilaterally, respirations unlabored. No wheezing. No cough elicited with deep inspiration or forced expiration.  Heart:  Regular rate and rhythm. 2/6 systolic murmur (known to patient).  Abdomen:   Soft, bowel sounds active all four quadrants,  no masses, no  organomegaly. No tenderness with palpation.  Extremities: Extremities normal, atraumatic, no cyanosis or edema  Pulses: 2+ and symmetric  Skin: Skin color, texture, turgor normal, no rashes or lesions  Lymph nodes: Cervical, supraclavicular, and axillary nodes normal  Neurologic: Normal    Assessment  & Plan:    Exam findings, diagnosis etiology and medication use and indications reviewed with patient. Follow-Up and discharge instructions provided. No emergent/urgent issues found on exam.  Patient education was provided.   Patient verbalized understanding of information provided and agrees with plan of care (POC), all questions answered. The patient is advised to call or return to clinic if condition does not see an improvement in symptoms, or to seek the care of the closest emergency department if condition worsens with the below plan.   Urinalysis, performed in clinic today, reveals 5 RBC/uL, negative ketones, negative protein, negative nitrites, negative glucose, S.G. <1.005, negative leukocytes.  Patient with 5 day history of right flank pain. Hematuria on UA (however, chronic for patient). No injury/trauma. Persistent history of cough. No UTI symptoms. No previous history of nephrolithiasis (though, patient reports within past year was on keto diet). Differential includes musculoskeletal injury, pneumonia, ureteral stone, etc. Given duration of symptoms and severity, advised patient seek further evaluation at medical center with imaging capabilities - believe patient would benefit from CXR/KUB, or kidney ultrasound for evaluation of possible hydronephrosis, or even CT scan of abdomen/pelvis for possible obstructive ureteral stone. Patient agrees with plan. Intends to go directly to urgent care for further evaluation.  Janalyn HarderSamantha Azari Hasler, MHS, PA-C Rulon SeraSamantha F. Earl Losee, MHS, PA-C Advanced Practice Provider [facility]  [facility]  [address], [facility], [address] [place], [place] [address] (p):  [phone] [email or web address]

## 2018-11-11 ENCOUNTER — Ambulatory Visit (INDEPENDENT_AMBULATORY_CARE_PROVIDER_SITE_OTHER): Payer: No Typology Code available for payment source | Admitting: Certified Nurse Midwife

## 2018-11-11 ENCOUNTER — Other Ambulatory Visit: Payer: Self-pay

## 2018-11-11 ENCOUNTER — Other Ambulatory Visit (HOSPITAL_COMMUNITY)
Admission: RE | Admit: 2018-11-11 | Discharge: 2018-11-11 | Disposition: A | Discharge status: Home / Self Care | Payer: No Typology Code available for payment source | Source: Ambulatory Visit | Attending: Certified Nurse Midwife | Admitting: Certified Nurse Midwife

## 2018-11-11 ENCOUNTER — Encounter: Payer: Self-pay | Admitting: Certified Nurse Midwife

## 2018-11-11 VITALS — BP 124/74 | HR 84 | Resp 14 | Ht 62.0 in | Wt 171.4 lb

## 2018-11-11 DIAGNOSIS — Z0000 Encounter for general adult medical examination without abnormal findings: Secondary | ICD-10-CM | POA: Diagnosis not present

## 2018-11-11 DIAGNOSIS — Z01419 Encounter for gynecological examination (general) (routine) without abnormal findings: Secondary | ICD-10-CM

## 2018-11-11 DIAGNOSIS — Z124 Encounter for screening for malignant neoplasm of cervix: Secondary | ICD-10-CM | POA: Insufficient documentation

## 2018-11-11 LAB — POCT URINALYSIS DIPSTICK
Bilirubin, UA: NEGATIVE
Blood, UA: NEGATIVE
Glucose, UA: NEGATIVE
Ketones, UA: NEGATIVE
Leukocytes, UA: NEGATIVE
Nitrite, UA: NEGATIVE
Protein, UA: NEGATIVE
Urobilinogen, UA: 0.2 E.U./dL
pH, UA: 5 (ref 5.0–8.0)

## 2018-11-11 NOTE — Progress Notes (Signed)
63 y.o. [id number] Married  African American Fe here for annual exam.  Sees Dr. Cliffton Asters PCP yearly for labs. Had tried boot camp and had hip pain. Plans to continue with less aggressive exercise. On weight loss now with spouse. Has lost 17 poounds with Keto diet. Noticed breast tenderness with lifting, but spontaneously resolved. No further occurrence. Mammogram scheduled. No other health issues today.  Patient's last menstrual period was 06/14/2010 (approximate).          Sexually active: Yes.    The current method of family planning is post menopausal status.    Exercising: No.  The patient does not participate in regular exercise at present. Smoker:  no  Review of Systems  Constitutional: Negative.   HENT: Negative.   Eyes: Negative.   Respiratory: Negative.   Cardiovascular:       Heart murmur  Gastrointestinal: Negative.   Genitourinary:       Breast pain   Musculoskeletal: Negative.   Skin: Negative.   Neurological: Negative.   Endo/Heme/Allergies: Negative.   Psychiatric/Behavioral: Negative.     Health Maintenance: Pap:  10-10-16 neg HPV HR neg History of Abnormal Pap: no MMG:  4/19 waiting on fax Self Breast exams: yes Colonoscopy:  2017 f/u 37yrs BMD:   2019 TDaP:  UTD Shingles: no Pneumonia: no Hep C and HIV: not done Labs: poct urine-neg   reports that she has never smoked. She has never used smokeless tobacco. She reports that she does not drink alcohol or use drugs.  Past Medical History:  Diagnosis Date  . Microscopic hematuria 11/04   negative work up with Dr. Isabel Caprice  . Vitamin D deficiency     Past Surgical History:  Procedure Laterality Date  . CESAREAN SECTION     x2  . ROTATOR CUFF REPAIR Left 12/2005  . TUBAL LIGATION Bilateral 1991    Current Outpatient Medications  Medication Sig Dispense Refill  . calcium carbonate (TUMS - DOSED IN MG ELEMENTAL CALCIUM) 500 MG chewable tablet Chew 1 tablet by mouth daily.    . cholecalciferol (VITAMIN D) 1000  units tablet Take 1,000 Units by mouth daily.    . Multiple Vitamin (MULTIVITAMIN) tablet Take 1 tablet by mouth daily.     No current facility-administered medications for this visit.     Family History  Problem Relation Age of Onset  . Breast cancer Mother 27  . Diabetes Mother   . Colon cancer Mother   . Heart failure Father        CHF  . COPD Father   . Diabetes Brother   . Hypertension Sister        maybe some BP issues  . Diabetes Sister   . Heart disease Brother 48       angioplasty  . Colon cancer Maternal Grandmother     ROS:  Pertinent items are noted in HPI.  Otherwise, a comprehensive ROS was negative.  Exam:   BP 124/74 (BP Location: Right Arm, Patient Position: Sitting, Cuff Size: Normal)   Pulse 84   Resp 14   Ht 5\' 2"  (1.575 m)   Wt 171 lb 6.4 oz (77.7 kg)   LMP 06/14/2010 (Approximate)   BMI 31.35 kg/m  Height: 5\' 2"  (157.5 cm) Ht Readings from Last 3 Encounters:  11/11/18 5\' 2"  (1.575 m)  12/11/17 5\' 2"  (1.575 m)  11/04/17 5' 2.25" (1.581 m)    General appearance: alert, cooperative and appears stated age Head: Normocephalic, without obvious abnormality, atraumatic Neck:  no adenopathy, supple, symmetrical, trachea midline and thyroid normal to inspection and palpation Lungs: clear to auscultation bilaterally Breasts: normal appearance, no masses or tenderness, No nipple retraction or dimpling, No nipple discharge or bleeding, No axillary or supraclavicular adenopathy, pendulous Heart: regular rate and rhythm with grade 3 murmur noted Abdomen: soft, non-tender; no masses,  no organomegaly Extremities: extremities normal, atraumatic, no cyanosis or edema Skin: Skin color, texture, turgor normal. No rashes or lesions Lymph nodes: Cervical, supraclavicular, and axillary nodes normal. No abnormal inguinal nodes palpated Neurologic: Grossly normal   Pelvic: External genitalia:  no lesions, normal female, slight atrophy noted              Urethra:   normal appearing urethra with no masses, tenderness or lesions              Bartholin's and Skene's: normal                 Vagina: normal appearing vagina with normal color and discharge, no lesions              Cervix: multiparous appearance, no cervical motion tenderness and no lesions              Pap taken: Yes.   Bimanual Exam:  Uterus:  normal size, contour, position, consistency, mobility, non-tender and anteverted              Adnexa: normal adnexa and no mass, fullness, tenderness               Rectovaginal: Confirms               Anus:  normal sphincter tone, no lesions  Chaperone present: yes  A:  Well Woman with normal exam  Post menopausal   Osteopenia with PCP management  Heart mumur with recent evaluation with cardiology, no concerns  P:   Reviewed health and wellness pertinent to exam  Aware of need to advise if vaginal bleeding  Continue follow up with PCP as indicated  Continue follow up with cardiology as indicated  Pap smear: yes   Mammogram, SBE recommended, Discussed Vitamin d and calcium importance in diet, regular exercise, colonoscopy when due and importance of pap smear per guidelines  return annually or prn  An After Visit Summary was printed and given to the patient.

## 2018-11-11 NOTE — Patient Instructions (Signed)

## 2018-11-16 LAB — CYTOLOGY - PAP
Diagnosis: NEGATIVE
HPV: NOT DETECTED

## 2019-06-28 MED FILL — IBUPROFEN 800 MG TABS: 800 | 6 days supply | Qty: 24 | Fill #0

## 2019-07-02 MED FILL — AMOXICILLIN 875 MG TABS: 875 | 10 days supply | Qty: 20 | Fill #0

## 2019-07-19 MED FILL — SULFAMETHOXAZOLE-TMP DS TAB: 800-160 | 7 days supply | Qty: 14 | Fill #0

## 2019-10-04 DIAGNOSIS — G542 Cervical root disorders, not elsewhere classified: Secondary | ICD-10-CM | POA: Diagnosis not present

## 2019-10-04 MED FILL — CYCLOBENZAPRINE 10 MG TAB: 10 | 5 days supply | Qty: 15 | Fill #0

## 2019-10-04 MED FILL — NAPROXEN 500 MG TABLET: 500 | 15 days supply | Qty: 30 | Fill #0

## 2019-10-29 DIAGNOSIS — R69 Illness, unspecified: Secondary | ICD-10-CM | POA: Diagnosis not present

## 2019-11-15 ENCOUNTER — Other Ambulatory Visit: Payer: Self-pay

## 2019-11-15 NOTE — Progress Notes (Signed)
64 y.o. [id number] Married  African American Fe here for annual exam. Post menopausal denies vaginal dryness or vaginal bleeding. Sees Dr. Cliffton Asters as PCP for labs and medication management if needed. Mammogram up to date. Working in wound care but ready to retire. No other health issues today.  Patient's last menstrual period was 06/14/2010 (approximate).          Sexually active: Yes.    The current method of family planning is post menopausal status.    Exercising: Yes.    some walking Smoker:  no  Review of Systems  Constitutional: Negative.   HENT: Negative.   Eyes: Negative.   Respiratory: Negative.   Cardiovascular: Negative.   Gastrointestinal: Negative.   Genitourinary: Negative.   Musculoskeletal: Negative.   Skin: Negative.   Neurological: Negative.   Endo/Heme/Allergies: Negative.   Psychiatric/Behavioral: Negative.     Health Maintenance: Pap:  10-10-16 neg HPV HR neg, 11-11-2018 neg HPV HR neg History of Abnormal Pap: no MMG:  2020 neg patient to have it faxed over Self Breast exams: yes Colonoscopy:  2017 f/u 41yrs BMD:   2019 TDaP:  Desires today Shingles: no Pneumonia: no Hep C and HIV: not done Labs: no   reports that she has never smoked. She has never used smokeless tobacco. She reports that she does not drink alcohol or use drugs.  Past Medical History:  Diagnosis Date  . Microscopic hematuria 11/04   negative work up with Dr. Isabel Caprice  . Osteopenia   . Vitamin D deficiency     Past Surgical History:  Procedure Laterality Date  . CESAREAN SECTION     x2  . ROTATOR CUFF REPAIR Left 12/2005  . TUBAL LIGATION Bilateral 1991    Current Outpatient Medications  Medication Sig Dispense Refill  . calcium carbonate (TUMS - DOSED IN MG ELEMENTAL CALCIUM) 500 MG chewable tablet Chew 2 tablets by mouth daily.     . Multiple Vitamin (MULTIVITAMIN) tablet Take 1 tablet by mouth daily.    . naproxen (NAPROSYN) 500 MG tablet Take 500 mg by mouth as needed.    Marland Kitchen  VITAMIN D PO Take 2,000 Units by mouth daily.      No current facility-administered medications for this visit.    Family History  Problem Relation Age of Onset  . Breast cancer Mother 34  . Diabetes Mother   . Colon cancer Mother   . Heart failure Father        CHF  . COPD Father   . Diabetes Brother   . Hypertension Sister        maybe some BP issues  . Diabetes Sister   . Heart disease Brother 48       angioplasty  . Colon cancer Maternal Grandmother     ROS:  Pertinent items are noted in HPI.  Otherwise, a comprehensive ROS was negative.  Exam:   BP 120/78   Pulse 68   Temp (!) 97.3 F (36.3 C) (Skin)   Resp 16   Ht 5' 2.25" (1.581 m)   Wt 180 lb (81.6 kg)   LMP 06/14/2010 (Approximate)   BMI 32.66 kg/m  Height: 5' 2.25" (158.1 cm) Ht Readings from Last 3 Encounters:  11/16/19 5' 2.25" (1.581 m)  11/11/18 5\' 2"  (1.575 m)  12/11/17 5\' 2"  (1.575 m)    General appearance: alert, cooperative and appears stated age Head: Normocephalic, without obvious abnormality, atraumatic Neck: no adenopathy, supple, symmetrical, trachea midline and thyroid normal to inspection and  palpation Lungs: clear to auscultation bilaterally Breasts: normal appearance, no masses or tenderness, No nipple retraction or dimpling, No nipple discharge or bleeding, No axillary or supraclavicular adenopathy Heart: regular rate and rhythm Abdomen: soft, non-tender; no masses,  no organomegaly Extremities: extremities normal, atraumatic, no cyanosis or edema Skin: Skin color, texture, turgor normal. No rashes or lesions Lymph nodes: Cervical, supraclavicular, and axillary nodes normal. No abnormal inguinal nodes palpated Neurologic: Grossly normal   Pelvic: External genitalia:  no lesions              Urethra:  normal appearing urethra with no masses, tenderness or lesions              Bartholin's and Skene's: normal                 Vagina: normal appearing vagina with normal color and  discharge, no lesions              Cervix: no cervical motion tenderness, no lesions and normal appearance              Pap taken: No. Bimanual Exam:  Uterus:  normal size, contour, position, consistency, mobility, non-tender and anteverted              Adnexa: normal adnexa and no mass, fullness, tenderness               Rectovaginal: Confirms               Anus:  normal sphincter tone, no lesions  Chaperone present: yes  A:  Well Woman with normal exam  Post menopausal no HRT  Osteopenia with PCP management  Immunization Due    P:   Reviewed health and wellness pertinent to exam  Aware of need to advise if vaginal bleeding  Discussed importance of weight bearing exercise, good calcium and Vitamin D intake. Continue follow up as indicated.  Requests TDAP  Pap smear: no   counseled on breast self exam, mammography screening, feminine hygiene, adequate intake of calcium and vitamin D, diet and exercise, Kegel's exercises  return annually or prn  An After Visit Summary was printed and given to the patient.

## 2019-11-16 ENCOUNTER — Encounter: Payer: Self-pay | Admitting: Certified Nurse Midwife

## 2019-11-16 ENCOUNTER — Ambulatory Visit (INDEPENDENT_AMBULATORY_CARE_PROVIDER_SITE_OTHER): Payer: Medicare HMO | Admitting: Certified Nurse Midwife

## 2019-11-16 ENCOUNTER — Other Ambulatory Visit: Payer: Self-pay

## 2019-11-16 VITALS — BP 120/78 | HR 68 | Temp 97.3°F | Resp 16 | Ht 62.25 in | Wt 180.0 lb

## 2019-11-16 DIAGNOSIS — Z23 Encounter for immunization: Secondary | ICD-10-CM

## 2019-11-16 DIAGNOSIS — Z01419 Encounter for gynecological examination (general) (routine) without abnormal findings: Secondary | ICD-10-CM

## 2019-11-16 DIAGNOSIS — Z780 Asymptomatic menopausal state: Secondary | ICD-10-CM

## 2019-11-16 NOTE — Patient Instructions (Addendum)
EXERCISE AND DIET:  We recommended that you start or continue a regular exercise program for good health. Regular exercise means any activity that makes your heart beat faster and makes you sweat.  We recommend exercising at least 30 minutes per day at least 3 days a week, preferably 4 or 5.  We also recommend a diet low in fat and sugar.  Inactivity, poor dietary choices and obesity can cause diabetes, heart attack, stroke, and kidney damage, among others.    ALCOHOL AND SMOKING:  Women should limit their alcohol intake to no more than 7 drinks/beers/glasses of wine (combined, not each!) per week. Moderation of alcohol intake to this level decreases your risk of breast cancer and liver damage. And of course, no recreational drugs are part of a healthy lifestyle.  And absolutely no smoking or even second hand smoke. Most people know smoking can cause heart and lung diseases, but did you know it also contributes to weakening of your bones? Aging of your skin?  Yellowing of your teeth and nails?  CALCIUM AND VITAMIN D:  Adequate intake of calcium and Vitamin D are recommended.  The recommendations for exact amounts of these supplements seem to change often, but generally speaking 600 mg of calcium (either carbonate or citrate) and 800 units of Vitamin D per day seems prudent. Certain women may benefit from higher intake of Vitamin D.  If you are among these women, your doctor will have told you during your visit.    PAP SMEARS:  Pap smears, to check for cervical cancer or precancers,  have traditionally been done yearly, although recent scientific advances have shown that most women can have pap smears less often.  However, every woman still should have a physical exam from her gynecologist every year. It will include a breast check, inspection of the vulva and vagina to check for abnormal growths or skin changes, a visual exam of the cervix, and then an exam to evaluate the size and shape of the uterus and  ovaries.  And after 66 years of age, a rectal exam is indicated to check for rectal cancers. We will also provide age appropriate advice regarding health maintenance, like when you should have certain vaccines, screening for sexually transmitted diseases, bone density testing, colonoscopy, mammograms, etc.   MAMMOGRAMS:  All women over 40 years old should have a yearly mammogram. Many facilities now offer a "3D" mammogram, which may cost around $50 extra out of pocket. If possible,  we recommend you accept the option to have the 3D mammogram performed.  It both reduces the number of women who will be called back for extra views which then turn out to be normal, and it is better than the routine mammogram at detecting truly abnormal areas.    COLONOSCOPY:  Colonoscopy to screen for colon cancer is recommended for all women at age 50.  We know, you hate the idea of the prep.  We agree, BUT, having colon cancer and not knowing it is worse!!  Colon cancer so often starts as a polyp that can be seen and removed at colonscopy, which can quite literally save your life!  And if your first colonoscopy is normal and you have no family history of colon cancer, most women don't have to have it again for 10 years.  Once every ten years, you can do something that may end up saving your life, right?  We will be happy to help you get it scheduled when you are ready.    Be sure to check your insurance coverage so you understand how much it will cost.  It may be covered as a preventative service at no cost, but you should check your particular policy.     https://www.cdc.gov/vaccines/hcp/vis/vis-statements/tdap.pdf">  Tdap (Tetanus, Diphtheria, Pertussis) Vaccine: What You Need to Know 1. Why get vaccinated? Tdap vaccine can prevent tetanus, diphtheria, and pertussis. Diphtheria and pertussis spread from person to person. Tetanus enters the body through cuts or wounds.  TETANUS (T) causes painful stiffening of the muscles.  Tetanus can lead to serious health problems, including being unable to open the mouth, having trouble swallowing and breathing, or death.  DIPHTHERIA (D) can lead to difficulty breathing, heart failure, paralysis, or death.  PERTUSSIS (aP), also known as "whooping cough," can cause uncontrollable, violent coughing which makes it hard to breathe, eat, or drink. Pertussis can be extremely serious in babies and young children, causing pneumonia, convulsions, brain damage, or death. In teens and adults, it can cause weight loss, loss of bladder control, passing out, and rib fractures from severe coughing. 2. Tdap vaccine Tdap is only for children 7 years and older, adolescents, and adults.  Adolescents should receive a single dose of Tdap, preferably at age 11 or 12 years. Pregnant women should get a dose of Tdap during every pregnancy, to protect the newborn from pertussis. Infants are most at risk for severe, life-threatening complications from pertussis. Adults who have never received Tdap should get a dose of Tdap. Also, adults should receive a booster dose every 10 years, or earlier in the case of a severe and dirty wound or burn. Booster doses can be either Tdap or Td (a different vaccine that protects against tetanus and diphtheria but not pertussis). Tdap may be given at the same time as other vaccines. 3. Talk with your health care provider Tell your vaccine provider if the person getting the vaccine:  Has had an allergic reaction after a previous dose of any vaccine that protects against tetanus, diphtheria, or pertussis, or has any severe, life-threatening allergies.  Has had a coma, decreased level of consciousness, or prolonged seizures within 7 days after a previous dose of any pertussis vaccine (DTP, DTaP, or Tdap).  Has seizures or another nervous system problem.  Has ever had Guillain-Barr Syndrome (also called GBS).  Has had severe pain or swelling after a previous dose of any  vaccine that protects against tetanus or diphtheria. In some cases, your health care provider may decide to postpone Tdap vaccination to a future visit.  People with minor illnesses, such as a cold, may be vaccinated. People who are moderately or severely ill should usually wait until they recover before getting Tdap vaccine.  Your health care provider can give you more information. 4. Risks of a vaccine reaction  Pain, redness, or swelling where the shot was given, mild fever, headache, feeling tired, and nausea, vomiting, diarrhea, or stomachache sometimes happen after Tdap vaccine. People sometimes faint after medical procedures, including vaccination. Tell your provider if you feel dizzy or have vision changes or ringing in the ears.  As with any medicine, there is a very remote chance of a vaccine causing a severe allergic reaction, other serious injury, or death. 5. What if there is a serious problem? An allergic reaction could occur after the vaccinated person leaves the clinic. If you see signs of a severe allergic reaction (hives, swelling of the face and throat, difficulty breathing, a fast heartbeat, dizziness, or weakness), call 9-1-1 and get the   person to the nearest hospital. For other signs that concern you, call your health care provider.  Adverse reactions should be reported to the Vaccine Adverse Event Reporting System (VAERS). Your health care provider will usually file this report, or you can do it yourself. Visit the VAERS website at www.vaers.hhs.gov or call 1-800-822-7967. VAERS is only for reporting reactions, and VAERS staff do not give medical advice. 6. The National Vaccine Injury Compensation Program The National Vaccine Injury Compensation Program (VICP) is a federal program that was created to compensate people who may have been injured by certain vaccines. Visit the VICP website at www.hrsa.gov/vaccinecompensation or call 1-800-338-2382 to learn about the program and  about filing a claim. There is a time limit to file a claim for compensation. 7. How can I learn more?  Ask your health care provider.  Call your local or state health department.  Contact the Centers for Disease Control and Prevention (CDC): ? Call 1-800-232-4636 (1-800-CDC-INFO) or ? Visit CDC's website at www.cdc.gov/vaccines Vaccine Information Statement Tdap (Tetanus, Diphtheria, Pertussis) Vaccine (01/13/2019) This information is not intended to replace advice given to you by your health care provider. Make sure you discuss any questions you have with your health care provider. Document Revised: 01/22/2019 Document Reviewed: 01/25/2019 Elsevier Patient Education  2020 Elsevier Inc.   

## 2019-12-28 MED FILL — SYMBICORT 80-4.5 MCG INH: 80-4.5 | 30 days supply | Qty: 10 | Fill #0

## 2019-12-30 MED FILL — ALBUTEROL SULFATE HFA 108 (: 108 (90 BAS | 25 days supply | Qty: 9 | Fill #0

## 2020-01-05 ENCOUNTER — Encounter: Payer: Self-pay | Admitting: Certified Nurse Midwife

## 2020-01-18 ENCOUNTER — Emergency Department (HOSPITAL_COMMUNITY): Payer: Medicare HMO

## 2020-01-18 ENCOUNTER — Emergency Department (HOSPITAL_COMMUNITY)
Admission: EM | Admit: 2020-01-18 | Discharge: 2020-01-18 | Disposition: A | Discharge status: Home / Self Care | Payer: Medicare HMO | Attending: Emergency Medicine | Admitting: Emergency Medicine

## 2020-01-18 ENCOUNTER — Other Ambulatory Visit: Payer: Self-pay

## 2020-01-18 DIAGNOSIS — R69 Illness, unspecified: Secondary | ICD-10-CM | POA: Diagnosis not present

## 2020-01-18 DIAGNOSIS — R002 Palpitations: Secondary | ICD-10-CM | POA: Diagnosis not present

## 2020-01-18 DIAGNOSIS — Z79899 Other long term (current) drug therapy: Secondary | ICD-10-CM | POA: Insufficient documentation

## 2020-01-18 DIAGNOSIS — I493 Ventricular premature depolarization: Secondary | ICD-10-CM

## 2020-01-18 DIAGNOSIS — F419 Anxiety disorder, unspecified: Secondary | ICD-10-CM | POA: Insufficient documentation

## 2020-01-18 LAB — CBC
HCT: 39.1 % (ref 36.0–46.0)
Hemoglobin: 12.8 g/dL (ref 12.0–15.0)
MCH: 30.3 pg (ref 26.0–34.0)
MCHC: 32.7 g/dL (ref 30.0–36.0)
MCV: 92.4 fL (ref 80.0–100.0)
Platelets: 327 10*3/uL (ref 150–400)
RBC: 4.23 MIL/uL (ref 3.87–5.11)
RDW: 13.9 % (ref 11.5–15.5)
WBC: 5.6 10*3/uL (ref 4.0–10.5)
nRBC: 0 % (ref 0.0–0.2)

## 2020-01-18 LAB — BASIC METABOLIC PANEL
Anion gap: 9 (ref 5–15)
BUN: 17 mg/dL (ref 8–23)
CO2: 26 mmol/L (ref 22–32)
Calcium: 9.4 mg/dL (ref 8.9–10.3)
Chloride: 106 mmol/L (ref 98–111)
Creatinine, Ser: 1.21 mg/dL — ABNORMAL HIGH (ref 0.44–1.00)
GFR calc Af Amer: 54 mL/min — ABNORMAL LOW (ref >60)
GFR calc non Af Amer: 47 mL/min — ABNORMAL LOW (ref >60)
Glucose, Bld: 108 mg/dL — ABNORMAL HIGH (ref 70–99)
Potassium: 3.9 mmol/L (ref 3.5–5.1)
Sodium: 141 mmol/L (ref 135–145)

## 2020-01-18 LAB — TROPONIN I (HIGH SENSITIVITY)
Troponin I (High Sensitivity): 4 ng/L (ref <18)
Troponin I (High Sensitivity): 4 ng/L (ref <18)

## 2020-01-18 LAB — TSH: TSH: 0.773 u[IU]/mL (ref 0.350–4.500)

## 2020-01-18 MED ORDER — METOPROLOL SUCCINATE ER 25 MG PO TB24
25.0000 mg | ORAL_TABLET | Freq: Every day | ORAL | Status: DC
  Administered 2020-01-18: 12.5 mg via ORAL
  Filled 2020-01-18: qty 1

## 2020-01-18 MED ORDER — SODIUM CHLORIDE 0.9% FLUSH: 3.0000 mL | Freq: Once | INTRAVENOUS | Status: DC

## 2020-01-18 NOTE — ED Provider Notes (Signed)
[facility] EMERGENCY DEPARTMENT Provider Note   CSN: [id number] Arrival date & time: 01/18/20  0762     History Chief Complaint  Patient presents with  . Palpitations    Lisa Benjamin is a 64 y.o. female.  Pt presents to the ED today with palpitations.  Pt is a retired Engineer, civil (consulting) who said for the last week, her HR has been up and down.  She feels it race and skip beats.  She denies any cp or sob.  She did have palpitations in March of 2019 and wore a 24 hr Holter Monitor.  That was nl and she has not been bothered with the palpitations since then.  Pt denies tobacco or alcohol use.  She has not been taking any otc meds.  She feels like she has not been sleeping well due to anxiety about what is going on with her heart.  She does not drink much caffeine.        Past Medical History:  Diagnosis Date  . Microscopic hematuria 11/04   negative work up with Dr. Isabel Caprice  . Osteopenia   . Vitamin D deficiency     There are no problems to display for this patient.   Past Surgical History:  Procedure Laterality Date  . CESAREAN SECTION     x2  . ROTATOR CUFF REPAIR Left 12/2005  . TUBAL LIGATION Bilateral 1991     OB History    Gravida  2   Para  2   Term  2   Preterm  0   AB  0   Living  2     SAB  0   TAB  0   Ectopic  0   Multiple  0   Live Births  2           Family History  Problem Relation Age of Onset  . Breast cancer Mother 69  . Diabetes Mother   . Colon cancer Mother   . Heart failure Father        CHF  . COPD Father   . Diabetes Brother   . Hypertension Sister        maybe some BP issues  . Diabetes Sister   . Heart disease Brother 48       angioplasty  . Colon cancer Maternal Grandmother     Social History   Tobacco Use  . Smoking status: Never Smoker  . Smokeless tobacco: Never Used  Substance Use Topics  . Alcohol use: No  . Drug use: No    Home Medications Prior to Admission medications   Medication  Sig Start Date End Date Taking? Authorizing Provider  calcium carbonate (TUMS - DOSED IN MG ELEMENTAL CALCIUM) 500 MG chewable tablet Chew 2 tablets by mouth daily.     [provider]  Multiple Vitamin (MULTIVITAMIN) tablet Take 1 tablet by mouth daily.    [provider]  naproxen (NAPROSYN) 500 MG tablet Take 500 mg by mouth as needed. 10/04/19   [provider]  VITAMIN D PO Take 2,000 Units by mouth daily.     [provider]    Allergies    Patient has no known allergies.  Review of Systems   Review of Systems  Cardiovascular: Positive for palpitations.  All other systems reviewed and are negative.   Physical Exam Updated Vital Signs BP (!) 141/69   Pulse 61   Temp 98.1 F (36.7 C)   Resp 14  LMP 06/14/2010 (Approximate)   SpO2 99%   Physical Exam Vitals and nursing note reviewed.  Constitutional:      Appearance: Normal appearance.  HENT:     Head: Normocephalic and atraumatic.     Right Ear: External ear normal.     Left Ear: External ear normal.     Nose: Nose normal.     Mouth/Throat:     Mouth: Mucous membranes are moist.     Pharynx: Oropharynx is clear.  Eyes:     Extraocular Movements: Extraocular movements intact.     Conjunctiva/sclera: Conjunctivae normal.     Pupils: Pupils are equal, round, and reactive to light.  Cardiovascular:     Rate and Rhythm: Normal rate and regular rhythm.     Pulses: Normal pulses.     Heart sounds: Normal heart sounds.     Comments: PVCs on monitor Pulmonary:     Effort: Pulmonary effort is normal.     Breath sounds: Normal breath sounds.  Abdominal:     General: Abdomen is flat. Bowel sounds are normal.     Palpations: Abdomen is soft.  Musculoskeletal:        General: Normal range of motion.     Cervical back: Normal range of motion and neck supple.  Skin:    General: Skin is warm.     Capillary Refill: Capillary refill takes less than 2 seconds.  Neurological:      General: No focal deficit present.     Mental Status: She is alert and oriented to person, place, and time.  Psychiatric:        Mood and Affect: Mood normal.        Behavior: Behavior normal.        Thought Content: Thought content normal.        Judgment: Judgment normal.     ED Results / Procedures / Treatments   Labs (all labs ordered are listed, but only abnormal results are displayed) Labs Reviewed  BASIC METABOLIC PANEL - Abnormal; Notable for the following components:      Result Value   Glucose, Bld 108 (*)    Creatinine, Ser 1.21 (*)    GFR calc non Af Amer 47 (*)    GFR calc Af Amer 54 (*)    All other components within normal limits  CBC  TSH  TROPONIN I (HIGH SENSITIVITY)  TROPONIN I (HIGH SENSITIVITY)    EKG EKG Interpretation  Date/Time:  Tuesday January 18 2020 09:36:37 EDT Ventricular Rate:  75 PR Interval:  142 QRS Duration: 130 QT Interval:  412 QTC Calculation: 460 R Axis:   111 Text Interpretation: Sinus rhythm with sinus arrhythmia with occasional Premature ventricular complexes Right atrial enlargement Right bundle branch block Left posterior fascicular block Abnormal ECG PVCs new Reconfirmed by Isla Pence [phone]) on 01/18/2020 6:09:52 PM   Radiology DG Chest 2 View  Result Date: 01/18/2020 CLINICAL DATA:  Palpitations. EXAM: CHEST - 2 VIEW COMPARISON:  09/30/2019 FINDINGS: The heart size and mediastinal contours are within normal limits. Both lungs are clear. The visualized skeletal structures are unremarkable. IMPRESSION: No active cardiopulmonary disease. Electronically Signed   By: Kerby Moors M.D.   On: 01/18/2020 10:05    Procedures Procedures (including critical care time)  Medications Ordered in ED Medications  sodium chloride flush (NS) 0.9 % injection 3 mL (has no administration in time range)  metoprolol succinate (TOPROL-XL) 24 hr tablet 25 mg (has no administration in time range)  ED Course  I have reviewed the triage  vital signs and the nursing notes.  Pertinent labs & imaging results that were available during my care of the patient were reviewed by me and considered in my medical decision making (see chart for details).    MDM Rules/Calculators/A&P                      Pt had several PVCs on monitor.  Some were couplets and some bigeminy.  Pt d/w Dr. Eden Emms (on call for Dr. [place]) who recommended starting pt on Toprol XL 25 mg daily.  He will send a note to Dr. [place] for pt to be seen in follow up.  Pt is good with this plan.  Pt knows to return if worse.  Final Clinical Impression(s) / ED Diagnoses Final diagnoses:  PVC (premature ventricular contraction)    Rx / DC Orders ED Discharge Orders    None       Jacalyn Lefevre, MD 01/18/20 1810

## 2020-01-18 NOTE — ED Triage Notes (Signed)
Pt here for evaluation of intermittent palpitations over the last week, more regular since Sunday. Tracking her heart rate at home, reports the range goes from 56-84 bpm. Denies cp or shob. Hx of same, wore a heart monitor with no significant findings.

## 2020-01-19 ENCOUNTER — Other Ambulatory Visit: Payer: Self-pay | Admitting: Cardiology

## 2020-01-19 NOTE — Telephone Encounter (Signed)
New message   *STAT* If patient is at the pharmacy, call can be transferred to refill team.   1. Which medications need to be refilled? (please list name of each medication and dose if known) Toprol XL (metoprolol succinate) 25 mg  2. Which pharmacy/location (including street and city if local pharmacy) is medication to be sent to? [facility] - [place], [place] - [address] [facility].  3. Do they need a 30 day or 90 day supply? 30 day    States that she was supposed to get a prescription for the medication from the ER, prescription was not sent home with paperwork. Patient wants to get prescription called in to pharmacy.

## 2020-01-20 ENCOUNTER — Other Ambulatory Visit: Payer: Self-pay

## 2020-01-20 DIAGNOSIS — Z13220 Encounter for screening for lipoid disorders: Secondary | ICD-10-CM | POA: Diagnosis not present

## 2020-01-20 DIAGNOSIS — E559 Vitamin D deficiency, unspecified: Secondary | ICD-10-CM | POA: Diagnosis not present

## 2020-01-20 DIAGNOSIS — Z1159 Encounter for screening for other viral diseases: Secondary | ICD-10-CM | POA: Diagnosis not present

## 2020-01-20 DIAGNOSIS — R002 Palpitations: Secondary | ICD-10-CM

## 2020-01-20 DIAGNOSIS — Z23 Encounter for immunization: Secondary | ICD-10-CM | POA: Diagnosis not present

## 2020-01-20 DIAGNOSIS — Z0000 Encounter for general adult medical examination without abnormal findings: Secondary | ICD-10-CM | POA: Diagnosis not present

## 2020-01-20 DIAGNOSIS — I493 Ventricular premature depolarization: Secondary | ICD-10-CM | POA: Diagnosis not present

## 2020-01-20 MED FILL — METOPROLOL SUCCINATE ER 25: 25 | 30 days supply | Qty: 15 | Fill #0

## 2020-01-20 NOTE — Progress Notes (Deleted)
Cardiology Office Note   Date:  01/20/2020   ID:  Lisa Benjamin, DOB 07/22/1956, MRN [id number]  PCP:  Harlan Stains, MD  Cardiologist:   Kire Ferg [place], MD   No chief complaint on file.     History of Present Illness: Lisa Benjamin is a 64 y.o. female who is seen for post hospital follow up. She was last seen by me 2 years ago for a RBBB. She is a Marine scientist at [employer]. She reports a history of a "functional" murmur in the past. She had an Echo in 1998 that was normal except for mild TR. In 2019 she was noted to have a new RBBB. She was also noted on Ecg to have a junctional rhythm on one occasion. She was asymptomatic. Holter monitor showed no junctional rhythm and only very rare PACs and PVCs. She was seen in the ED on 01/18/20 with complaints of palpitations. Ecg showed PVCs. Labs ok except for CKD. She was started on Toprol XL and is seen for follow up.     Past Medical History:  Diagnosis Date  . Microscopic hematuria 11/04   negative work up with Dr. Risa Grill  . Osteopenia   . Vitamin D deficiency     Past Surgical History:  Procedure Laterality Date  . CESAREAN SECTION     x2  . ROTATOR CUFF REPAIR Left 12/2005  . TUBAL LIGATION Bilateral 1991     Current Outpatient Medications  Medication Sig Dispense Refill  . calcium carbonate (TUMS - DOSED IN MG ELEMENTAL CALCIUM) 500 MG chewable tablet Chew 2 tablets by mouth daily.     . Multiple Vitamin (MULTIVITAMIN) tablet Take 1 tablet by mouth daily.    . naproxen (NAPROSYN) 500 MG tablet Take 500 mg by mouth as needed.    Marland Kitchen VITAMIN D PO Take 2,000 Units by mouth daily.      No current facility-administered medications for this visit.    Allergies:   Patient has no known allergies.    Social History:  The patient  reports that she has never smoked. She has never used smokeless tobacco. She reports that she does not drink alcohol or use drugs.   Family History:  The patient's family history includes Breast cancer  (age of onset: 79) in her mother; COPD in her father; Colon cancer in her maternal grandmother and mother; Diabetes in her brother, mother, and sister; Heart disease (age of onset: 77) in her brother; Heart failure in her father; Hypertension in her sister.    ROS:  Please see the history of present illness.   Otherwise, review of systems are positive for none.   All other systems are reviewed and negative.    PHYSICAL EXAM: VS:  LMP 06/14/2010 (Approximate)  , BMI There is no height or weight on file to calculate BMI. GEN: Well nourished, well developed, in no acute distress  HEENT: normal  Neck: no JVD, carotid bruits, or masses Cardiac: RRR; there is a soft systolic murmur 2-4/2 LUSB. No rubs, or gallops,no edema  Respiratory:  clear to auscultation bilaterally, normal work of breathing GI: soft, nontender, nondistended, + BS MS: no deformity or atrophy  Skin: warm and dry, no rash Neuro:  Strength and sensation are intact Psych: euthymic mood, full affect   EKG:  EKG is ordered today. The ekg ordered today demonstrates NSR rate 70, RBBB. I have personally reviewed and interpreted this study.  EKG on 11/10/17 showed junctional rhythm with rate 64. RBBB. I  have personally reviewed and interpreted this study.    Recent Labs: 01/18/2020: BUN 17; Creatinine, Ser 1.21; Hemoglobin 12.8; Platelets 327; Potassium 3.9; Sodium 141; TSH 0.773    Lipid Panel No results found for: CHOL, TRIG, HDL, CHOLHDL, VLDL, LDLCALC, LDLDIRECT    Wt Readings from Last 3 Encounters:  11/16/19 180 lb (81.6 kg)  11/11/18 171 lb 6.4 oz (77.7 kg)  09/21/18 173 lb (78.5 kg)      Other studies Reviewed: Additional studies/ records that were reviewed today include:  Labs dated 11/10/17: cholesterol 167, triglycerides 74, HDL 58, LDL 94. Hgb 12.5. Creatinine 1.15.  Holter 12/11/17: Study Highlights   Normal sinus rhythm with normal HR range  Rare isolated PACs and PVCs  No junctional rhythm seen     ASSESSMENT AND PLAN:  1.  RBBB. This is a benign finding in an otherwise healthy female. No symptoms. No further work up required. 2. Junctional rhythm - noted on ECG on January 29 but not today. Again rate is normal and she is asymptomatic. To make sure she is not having significant bradycardia will have her wear a 24 hour Holter monitor. If OK will reassure.    Current medicines are reviewed at length with the patient today.  The patient does not have concerns regarding medicines.  The following changes have been made:  no change  Labs/ tests ordered today include:   No orders of the defined types were placed in this encounter.    Disposition:   FU TBD. If Holter is OK will follow up PRN.  Signed, Inioluwa Boulay [place], MD  01/20/2020 8:06 AM    [facility] [address], [place], [place], [address] Phone [phone], Fax [phone]

## 2020-01-20 NOTE — Progress Notes (Signed)
Long

## 2020-01-20 NOTE — Progress Notes (Signed)
Cardiology Office Note   Date:  01/21/2020   ID:  Lisa Benjamin, DOB 08-25-1956, MRN [id number]  PCP:  Laurann Montana, MD  Cardiologist:   Gena Laski [place], MD   Chief Complaint  Patient presents with  . Palpitations      History of Present Illness: Lisa Benjamin is a 64 y.o. female who is seen for post hospital follow up of palpitations.  She is a Engineer, civil (consulting) at [employer]. She was seen 2 years ago for a new RBBB.  She reports a history of a "functional" murmur in the past. She had an Echo in 1998 that was normal except for mild TR. She had an Ecg that showed a Junctional rhythm. She was asymptomatic. A Holter monitor was done showing no Junctional rhythm. Very rare PACs and PVCs. No further work up needed.   She was recently seen in the ED on 01/18/20 with palpitations. Ecg showed NSR with PVCs. RBBB. Creatinine was 1.21. she was given Toprol XL 25 mg once. Since then she denies any further symptoms.  She has retired this past year. She notes that her husband recently started on CPAP so she has not been sleeping well.    Past Medical History:  Diagnosis Date  . Microscopic hematuria 11/04   negative work up with Dr. Isabel Caprice  . Osteopenia   . Vitamin D deficiency     Past Surgical History:  Procedure Laterality Date  . CESAREAN SECTION     x2  . ROTATOR CUFF REPAIR Left 12/2005  . TUBAL LIGATION Bilateral 1991     Current Outpatient Medications  Medication Sig Dispense Refill  . calcium carbonate (TUMS - DOSED IN MG ELEMENTAL CALCIUM) 500 MG chewable tablet Chew 2 tablets by mouth daily.     . metoprolol succinate (TOPROL-XL) 25 MG 24 hr tablet Take 12.5 mg by mouth daily.    . Multiple Vitamin (MULTIVITAMIN) tablet Take 1 tablet by mouth daily.    Marland Kitchen VITAMIN D PO Take 2,000 Units by mouth daily.      No current facility-administered medications for this visit.    Allergies:   Patient has no known allergies.    Social History:  The patient  reports that she has never  smoked. She has never used smokeless tobacco. She reports that she does not drink alcohol or use drugs.   Family History:  The patient's family history includes Breast cancer (age of onset: 55) in her mother; COPD in her father; Colon cancer in her maternal grandmother and mother; Diabetes in her brother, mother, and sister; Heart disease (age of onset: 37) in her brother; Heart failure in her father; Hypertension in her sister.    ROS:  Please see the history of present illness.   Otherwise, review of systems are positive for none.   All other systems are reviewed and negative.    PHYSICAL EXAM: VS:  BP 138/72   Pulse (!) 56   Ht 5\' 2"  (1.575 m)   Wt 179 lb (81.2 kg)   LMP 06/14/2010 (Approximate)   SpO2 96%   BMI 32.74 kg/m  , BMI Body mass index is 32.74 kg/m. GEN: Well nourished, well developed, in no acute distress  HEENT: normal  Neck: no JVD, carotid bruits, or masses Cardiac: RRR; there is a soft systolic murmur 1-2/6 LUSB. No rubs, or gallops,no edema  Respiratory:  clear to auscultation bilaterally, normal work of breathing GI: soft, nontender, nondistended, + BS MS: no deformity or atrophy  Skin:  warm and dry, no rash Neuro:  Strength and sensation are intact Psych: euthymic mood, full affect   EKG:  EKG is not ordered today.   EKG on 4/6/21showed NSR with occ PVC.  RBBB. I have personally reviewed and interpreted this study.    Recent Labs: 01/18/2020: BUN 17; Creatinine, Ser 1.21; Hemoglobin 12.8; Platelets 327; Potassium 3.9; Sodium 141; TSH 0.773    Lipid Panel No results found for: CHOL, TRIG, HDL, CHOLHDL, VLDL, LDLCALC, LDLDIRECT    Wt Readings from Last 3 Encounters:  01/21/20 179 lb (81.2 kg)  11/16/19 180 lb (81.6 kg)  11/11/18 171 lb 6.4 oz (77.7 kg)      Other studies Reviewed: Additional studies/ records that were reviewed today include:  Labs dated 11/10/17: cholesterol 167, triglycerides 74, HDL 58, LDL 94. Hgb 12.5. Creatinine  1.15.   ASSESSMENT AND PLAN:  1.  RBBB. This is a benign and chronic.  No symptoms.  2.  PVCs with associated palpitations. I suspect this was triggered by reduced sleep. Will obtain an Echo. If LV function is normal we can reassure her these are benign. I would only use Toprol prn    Current medicines are reviewed at length with the patient today.  The patient does not have concerns regarding medicines.  The following changes have been made:  no change  Labs/ tests ordered today include:   Orders Placed This Encounter  Procedures  . ECHOCARDIOGRAM COMPLETE     Disposition:   FU TBD.   Signed, Shamond Skelton [place], MD  01/21/2020 11:19 AM    [facility] [address], [place], [place], [address] Phone [phone], Fax [phone]

## 2020-01-20 NOTE — Telephone Encounter (Signed)
Follow Up:   Pt is checking on the status of her Metoprolol refill. She says she still have not received it.

## 2020-01-21 ENCOUNTER — Other Ambulatory Visit: Payer: Self-pay

## 2020-01-21 ENCOUNTER — Ambulatory Visit: Payer: Medicare HMO | Admitting: Cardiology

## 2020-01-21 ENCOUNTER — Encounter: Payer: Self-pay | Admitting: Cardiology

## 2020-01-21 VITALS — BP 138/72 | HR 56 | Ht 62.0 in | Wt 179.0 lb

## 2020-01-21 DIAGNOSIS — R002 Palpitations: Secondary | ICD-10-CM

## 2020-01-21 DIAGNOSIS — I493 Ventricular premature depolarization: Secondary | ICD-10-CM

## 2020-01-21 DIAGNOSIS — I4510 Unspecified right bundle-branch block: Secondary | ICD-10-CM

## 2020-01-21 NOTE — Patient Instructions (Signed)
Medication Instructions:  Continue same medications   Lab Work: None ordered   Testing/Procedures:  Schedule Echo   Follow-Up: At [facility], you and your health needs are our priority.  As part of our continuing mission to provide you with exceptional heart care, we have created designated Provider Care Teams.  These Care Teams include your primary Cardiologist (physician) and Advanced Practice Providers (APPs -  Physician Assistants and Nurse Practitioners) who all work together to provide you with the care you need, when you need it.  We recommend signing up for the patient portal called "MyChart".  Sign up information is provided on this After Visit Summary.  MyChart is used to connect with patients for Virtual Visits (Telemedicine).  Patients are able to view lab/test results, encounter notes, upcoming appointments, etc.  Non-urgent messages can be sent to your provider as well.   To learn more about what you can do with MyChart, go to [email or web address].    Your next appointment:  To be determined after test   The format for your next appointment: Office    Provider:  Dr.Jordan   

## 2020-01-21 NOTE — Telephone Encounter (Signed)
Rx has been sent to [facility] for TOPROL XL 25 mg QD.

## 2020-02-08 ENCOUNTER — Ambulatory Visit (HOSPITAL_COMMUNITY): Payer: Medicare HMO | Attending: Internal Medicine

## 2020-02-08 ENCOUNTER — Other Ambulatory Visit: Payer: Self-pay

## 2020-02-08 DIAGNOSIS — I4510 Unspecified right bundle-branch block: Secondary | ICD-10-CM | POA: Insufficient documentation

## 2020-02-08 DIAGNOSIS — R002 Palpitations: Secondary | ICD-10-CM | POA: Insufficient documentation

## 2020-02-08 DIAGNOSIS — I493 Ventricular premature depolarization: Secondary | ICD-10-CM | POA: Insufficient documentation

## 2020-02-20 ENCOUNTER — Other Ambulatory Visit: Payer: Self-pay

## 2020-02-20 ENCOUNTER — Ambulatory Visit
Admission: EM | Admit: 2020-02-20 | Discharge: 2020-02-20 | Disposition: A | Discharge status: Home / Self Care | Payer: Medicare HMO | Attending: Emergency Medicine | Admitting: Emergency Medicine

## 2020-02-20 DIAGNOSIS — S30860A Insect bite (nonvenomous) of lower back and pelvis, initial encounter: Secondary | ICD-10-CM

## 2020-02-20 DIAGNOSIS — W57XXXA Bitten or stung by nonvenomous insect and other nonvenomous arthropods, initial encounter: Secondary | ICD-10-CM | POA: Diagnosis not present

## 2020-02-20 DIAGNOSIS — R5383 Other fatigue: Secondary | ICD-10-CM

## 2020-02-20 DIAGNOSIS — H5789 Other specified disorders of eye and adnexa: Secondary | ICD-10-CM | POA: Diagnosis not present

## 2020-02-20 DIAGNOSIS — S70361A Insect bite (nonvenomous), right thigh, initial encounter: Secondary | ICD-10-CM

## 2020-02-20 MED ORDER — DOXYCYCLINE HYCLATE 100 MG PO CAPS: 100.0000 mg | ORAL_CAPSULE | Freq: Two times a day (BID) | ORAL | 0 refills | Status: AC

## 2020-02-20 MED ORDER — LUBRICANT EYE DROPS 0.4-0.3 % OP SOLN: 1.0000 [drp] | Freq: Three times a day (TID) | OPHTHALMIC | 0 refills | Status: DC | PRN

## 2020-02-20 NOTE — Discharge Instructions (Signed)
Take antibiotic twice daily with food. Follow-up with primary care tomorrow morning about today's visit and medication change. Recommend follow-up with PCP  in 1-2 weeks.  Use eyedrops as directed on eye(s) as prescribed.  May use artificial tear gel/drops at needed. Important to use artificial tear gel/drops last and wait 10-15 minutes between drops as it can prevent your prescription drops from working properly.  Important to follow up with Ophthalmology (eye doctor). Return sooner for worsening of symptoms, change in vision, sensitivity to light, eye swelling, painful eye movement, or fever.

## 2020-02-20 NOTE — ED Provider Notes (Signed)
[name]    CSN: [id number] Arrival date & time: 02/20/20  3664      History   Chief Complaint Chief Complaint  Patient presents with  . Tick Removal    HPI Lisa Benjamin is a 64 y.o. female presenting for tick bite of right lower back, right upper thigh.  Unsure how long its been there.  States that she first noticed it 1.5 weeks ago, though thought she got rid of it at that time.  Patient does endorse fatigue since then.  No headaches, change in vision, chest pain, difficulty breathing.  Also noting right eye irritation.  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  Denies change in vision, painful eye movements, photophobia, injury or foreign body exposure.  Does not wear contact lenses.  Past Medical History:  Diagnosis Date  . Microscopic hematuria 11/04   negative work up with Dr. Isabel Caprice  . Osteopenia   . Vitamin D deficiency     There are no problems to display for this patient.   Past Surgical History:  Procedure Laterality Date  . CESAREAN SECTION     x2  . ROTATOR CUFF REPAIR Left 12/2005  . TUBAL LIGATION Bilateral 1991    OB History    Gravida  2   Para  2   Term  2   Preterm  0   AB  0   Living  2     SAB  0   TAB  0   Ectopic  0   Multiple  0   Live Births  2            Home Medications    Prior to Admission medications   Medication Sig Start Date End Date Taking? Authorizing Provider  calcium carbonate (TUMS - DOSED IN MG ELEMENTAL CALCIUM) 500 MG chewable tablet Chew 2 tablets by mouth daily.     [provider]  doxycycline (VIBRAMYCIN) 100 MG capsule Take 1 capsule (100 mg total) by mouth 2 (two) times daily for 14 days. 02/20/20 03/05/20  Hall-Potvin, [place], PA-C  Multiple Vitamin (MULTIVITAMIN) tablet Take 1 tablet by mouth daily.    [provider]  Polyethyl Glycol-Propyl Glycol (LUBRICANT EYE DROPS) 0.4-0.3 % SOLN Apply 1 drop to eye 3 (three) times daily as needed. 02/20/20    Hall-Potvin, [place], PA-C  VITAMIN D PO Take 2,000 Units by mouth daily.     [provider]    Family History Family History  Problem Relation Age of Onset  . Breast cancer Mother 36  . Diabetes Mother   . Colon cancer Mother   . Heart failure Father        CHF  . COPD Father   . Diabetes Brother   . Hypertension Sister        maybe some BP issues  . Diabetes Sister   . Heart disease Brother 48       angioplasty  . Colon cancer Maternal Grandmother     Social History Social History   Tobacco Use  . Smoking status: Never Smoker  . Smokeless tobacco: Never Used  Substance Use Topics  . Alcohol use: No  . Drug use: No     Allergies   Patient has no known allergies.   Review of Systems As per HPI   Physical Exam Triage Vital Signs ED Triage Vitals  Enc Vitals Group     BP 02/20/20 1023 (!) 157/80     Pulse Rate 02/20/20  1023 66     Resp 02/20/20 1023 18     Temp 02/20/20 1023 98.4 F (36.9 C)     Temp Source 02/20/20 1023 Oral     SpO2 02/20/20 1023 98 %     Weight --      Height --      Head Circumference --      Peak Flow --      Pain Score 02/20/20 1024 0     Pain Loc --      Pain Edu? --      Excl. in [place]? --    No data found.  Updated Vital Signs BP (!) 157/80 (BP Location: Left Arm)   Pulse 66   Temp 98.4 F (36.9 C) (Oral)   Resp 18   LMP 06/14/2010 (Approximate)   SpO2 98%   Visual Acuity Right Eye Distance:   Left Eye Distance:   Bilateral Distance:    Right Eye Near:   Left Eye Near:    Bilateral Near:     Physical Exam Constitutional:      General: She is not in acute distress.    Appearance: She is not ill-appearing.  HENT:     Head: Normocephalic and atraumatic.  Eyes:     General: No scleral icterus.    Conjunctiva/sclera: Conjunctivae normal.     Pupils: Pupils are equal, round, and reactive to light.  Cardiovascular:     Rate and Rhythm: Normal rate.  Pulmonary:     Effort: Pulmonary effort is  normal.  Skin:    Coloration: Skin is not jaundiced or pale.     Findings: No rash.     Comments: Well-healing lesion on right low back: No tick or head embedded.  Nontender and without warmth or erythema. Right upper thigh with small black speck embedded in skin.  Successfully removed with 18-gauge needle: Unclear if head of tick.  Neurological:     Mental Status: She is alert and oriented to person, place, and time.      UC Treatments / Results  Labs (all labs ordered are listed, but only abnormal results are displayed) [place] MTN SPOTTED FVR ABS PNL(IGG+IGM)    EKG   Radiology No results found.  Procedures Procedures (including critical care time)  Medications Ordered in UC Medications - No data to display  Initial Impression / Assessment and Plan / UC Course  I have reviewed the triage vital signs and the nursing notes.  Pertinent labs & imaging results that were available during my care of the patient were reviewed by me and considered in my medical decision making (see chart for details).     Patient afebrile, nontoxic in office today.  Patient with tick bite that occurred 1.5 weeks ago and subsequent fatigue.  Will start doxycycline, draw Lyme, RMSF, and have patient follow-up with PCP in 1-2 weeks.  No obvious ticks embedded in full skin exam.  Eye exam unremarkable: We will add lubricant eyedrop to current regimen.  Return precautions discussed, patient verbalized understanding and is agreeable to plan. Final Clinical Impressions(s) / UC Diagnoses   Final diagnoses:  Irritation of right eye  Tick bite of lower back, initial encounter  Tick bite of right thigh, initial encounter  Other fatigue     Discharge Instructions     Take antibiotic twice daily with food. Follow-up with primary care tomorrow morning about today's visit and medication change. Recommend follow-up with PCP  in 1-2 weeks.  Use eyedrops as  directed on eye(s) as prescribed.  May use artificial tear gel/drops at needed. Important to use artificial tear gel/drops last and wait 10-15 minutes between drops as it can prevent your prescription drops from working properly.  Important to follow up with Ophthalmology (eye doctor). Return sooner for worsening of symptoms, change in vision, sensitivity to light, eye swelling, painful eye movement, or fever.     ED Prescriptions    Medication Sig Dispense Auth. Provider   doxycycline (VIBRAMYCIN) 100 MG capsule Take 1 capsule (100 mg total) by mouth 2 (two) times daily for 14 days. 28 capsule Hall-Potvin, [place], PA-C   Polyethyl Glycol-Propyl Glycol (LUBRICANT EYE DROPS) 0.4-0.3 % SOLN Apply 1 drop to eye 3 (three) times daily as needed. 10 mL Hall-Potvin, [place], PA-C     PDMP not reviewed this encounter.   Hall-Potvin, [place], [place] 02/20/20 1209

## 2020-02-20 NOTE — ED Triage Notes (Signed)
Pt states had a tick to her rt upper/inner thigh that she thinks she didn't get it all out. Pt states has a spot to rt side that looks like a tick x1.5 wks. Pt also c/o rt eye irritation with drainage x3 days

## 2020-02-22 LAB — ROCKY MTN SPOTTED FVR ABS PNL(IGG+IGM)
RMSF IgG: NEGATIVE
RMSF IgM: 0.36 index (ref 0.00–0.89)

## 2020-02-22 LAB — B. BURGDORFI ANTIBODIES: Lyme IgG/IgM Ab: <0.91 {ISR} (ref 0.00–0.90)

## 2020-03-02 DIAGNOSIS — R69 Illness, unspecified: Secondary | ICD-10-CM | POA: Diagnosis not present

## 2020-04-11 DIAGNOSIS — H25813 Combined forms of age-related cataract, bilateral: Secondary | ICD-10-CM | POA: Diagnosis not present

## 2020-04-11 DIAGNOSIS — H0288A Meibomian gland dysfunction right eye, upper and lower eyelids: Secondary | ICD-10-CM | POA: Diagnosis not present

## 2020-04-11 DIAGNOSIS — H1045 Other chronic allergic conjunctivitis: Secondary | ICD-10-CM | POA: Diagnosis not present

## 2020-04-11 DIAGNOSIS — H0288B Meibomian gland dysfunction left eye, upper and lower eyelids: Secondary | ICD-10-CM | POA: Diagnosis not present

## 2020-04-11 DIAGNOSIS — H04123 Dry eye syndrome of bilateral lacrimal glands: Secondary | ICD-10-CM | POA: Diagnosis not present

## 2020-04-13 DIAGNOSIS — Z1231 Encounter for screening mammogram for malignant neoplasm of breast: Secondary | ICD-10-CM | POA: Diagnosis not present

## 2020-06-07 ENCOUNTER — Other Ambulatory Visit: Payer: Medicare HMO

## 2020-06-07 ENCOUNTER — Other Ambulatory Visit: Payer: Self-pay

## 2020-06-07 DIAGNOSIS — Z20822 Contact with and (suspected) exposure to covid-19: Secondary | ICD-10-CM

## 2020-06-08 LAB — NOVEL CORONAVIRUS, NAA: SARS-CoV-2, NAA: NOT DETECTED

## 2020-06-08 LAB — SARS-COV-2, NAA 2 DAY TAT

## 2020-06-09 ENCOUNTER — Ambulatory Visit
Admission: EM | Admit: 2020-06-09 | Discharge: 2020-06-09 | Disposition: A | Discharge status: Home / Self Care | Payer: Medicare HMO | Attending: Emergency Medicine | Admitting: Emergency Medicine

## 2020-06-09 ENCOUNTER — Other Ambulatory Visit: Payer: Self-pay

## 2020-06-09 ENCOUNTER — Encounter: Payer: Self-pay | Admitting: Emergency Medicine

## 2020-06-09 DIAGNOSIS — J4521 Mild intermittent asthma with (acute) exacerbation: Secondary | ICD-10-CM

## 2020-06-09 MED ORDER — MONTELUKAST SODIUM 10 MG PO TABS: 10.0000 mg | ORAL_TABLET | Freq: Every day | ORAL | 0 refills | Status: DC

## 2020-06-09 MED ORDER — BENZONATATE 100 MG PO CAPS: 100.0000 mg | ORAL_CAPSULE | Freq: Three times a day (TID) | ORAL | 0 refills | Status: DC

## 2020-06-09 MED ORDER — PREDNISONE 50 MG PO TABS: 50.0000 mg | ORAL_TABLET | Freq: Every day | ORAL | 0 refills | Status: DC

## 2020-06-09 NOTE — Discharge Instructions (Signed)

## 2020-06-09 NOTE — ED Provider Notes (Signed)
[name]    CSN: [id number] Arrival date & time: 06/09/20  1418      History   Chief Complaint Chief Complaint  Patient presents with  . Asthma    HPI Lisa Benjamin is a 64 y.o. female with history of asthma presenting for asthma flare.  Endorsing dry cough, chest tightness with wheezing for the last 5 days.  Underwent Covid testing through her charts on Wednesday: Negative.  Has been using her Symbicort inhaler instead of proair due to jittery adr.  Denies fever, productive cough, known sick contacts, chest pain or palpitations, shortness of breath.   Past Medical History:  Diagnosis Date  . Microscopic hematuria 11/04   negative work up with Dr. Isabel Caprice  . Osteopenia   . Vitamin D deficiency     There are no problems to display for this patient.   Past Surgical History:  Procedure Laterality Date  . CESAREAN SECTION     x2  . ROTATOR CUFF REPAIR Left 12/2005  . TUBAL LIGATION Bilateral 1991    OB History    Gravida  2   Para  2   Term  2   Preterm  0   AB  0   Living  2     SAB  0   TAB  0   Ectopic  0   Multiple  0   Live Births  2            Home Medications    Prior to Admission medications   Medication Sig Start Date End Date Taking? Authorizing Provider  benzonatate (TESSALON) 100 MG capsule Take 1 capsule (100 mg total) by mouth every 8 (eight) hours. 06/09/20   Hall-Potvin, [place], PA-C  calcium carbonate (TUMS - DOSED IN MG ELEMENTAL CALCIUM) 500 MG chewable tablet Chew 2 tablets by mouth daily.     [provider]  montelukast (SINGULAIR) 10 MG tablet Take 1 tablet (10 mg total) by mouth at bedtime. 06/09/20   Hall-Potvin, [place], PA-C  Multiple Vitamin (MULTIVITAMIN) tablet Take 1 tablet by mouth daily.    [provider]  Polyethyl Glycol-Propyl Glycol (LUBRICANT EYE DROPS) 0.4-0.3 % SOLN Apply 1 drop to eye 3 (three) times daily as needed. 02/20/20   Hall-Potvin, [place], PA-C  predniSONE  (DELTASONE) 50 MG tablet Take 1 tablet (50 mg total) by mouth daily with breakfast. 06/09/20   Hall-Potvin, [place], PA-C  VITAMIN D PO Take 2,000 Units by mouth daily.     [provider]    Family History Family History  Problem Relation Age of Onset  . Breast cancer Mother 51  . Diabetes Mother   . Colon cancer Mother   . Heart failure Father        CHF  . COPD Father   . Diabetes Brother   . Hypertension Sister        maybe some BP issues  . Diabetes Sister   . Heart disease Brother 48       angioplasty  . Colon cancer Maternal Grandmother     Social History Social History   Tobacco Use  . Smoking status: Never Smoker  . Smokeless tobacco: Never Used  Vaping Use  . Vaping Use: Never used  Substance Use Topics  . Alcohol use: No  . Drug use: No     Allergies   Patient has no known allergies.   Review of Systems As per HPI  Physical Exam Triage Vital Signs ED Triage Vitals  Enc Vitals Group     BP      Pulse      Resp      Temp      Temp src      SpO2      Weight      Height      Head Circumference      Peak Flow      Pain Score      Pain Loc      Pain Edu?      Excl. in [place]?    No data found.  Updated Vital Signs BP (!) 164/69 (BP Location: Right Arm)   Pulse 93   Temp 98.2 F (36.8 C) (Oral)   Resp 18   LMP 06/14/2010 (Approximate)   SpO2 96%   Visual Acuity Right Eye Distance:   Left Eye Distance:   Bilateral Distance:    Right Eye Near:   Left Eye Near:    Bilateral Near:     Physical Exam Constitutional:      General: She is not in acute distress. HENT:     Head: Normocephalic and atraumatic.  Eyes:     General: No scleral icterus.    Pupils: Pupils are equal, round, and reactive to light.  Cardiovascular:     Rate and Rhythm: Normal rate and regular rhythm.     Pulses: Normal pulses.     Heart sounds: Normal heart sounds.  Pulmonary:     Effort: Pulmonary effort is normal. No respiratory distress.      Breath sounds: Wheezing present. No rales.     Comments: Mild, diffuse Skin:    Coloration: Skin is not jaundiced or pale.  Neurological:     Mental Status: She is alert and oriented to person, place, and time.      UC Treatments / Results  Labs (all labs ordered are listed, but only abnormal results are displayed) Labs Reviewed - No data to display  EKG   Radiology  No results found.  Procedures Procedures (including critical care time)  Medications Ordered in UC Medications - No data to display  Initial Impression / Assessment and Plan / UC Course  I have reviewed the triage vital signs and the nursing notes.  Pertinent labs & imaging results that were available during my care of the patient were reviewed by me and considered in my medical decision making (see chart for details).     Patient with negative Covid, no respiratory distress.  Denies chest pain.  Patient with numerous airway irritant exposure recently: Likely asthma exacerbation.  Will treat supportively as outlined below.  Return precautions discussed, pt verbalized understanding and is agreeable to plan. Final Clinical Impressions(s) / UC Diagnoses   Final diagnoses:  Mild intermittent asthma with exacerbation     Discharge Instructions     Tessalon for cough. Start flonase, atrovent nasal spray for nasal congestion/drainage. You can use over the counter nasal saline rinse such as neti pot for nasal congestion. Keep hydrated, your urine should be clear to pale yellow in color. Tylenol/motrin for fever and pain. Monitor for any worsening of symptoms, chest pain, shortness of breath, wheezing, swelling of the throat, go to the emergency department for further evaluation needed.     ED Prescriptions    Medication Sig Dispense Auth. Provider   montelukast (SINGULAIR) 10 MG tablet Take 1 tablet (10 mg total) by mouth at bedtime. 30 tablet Hall-Potvin, [place], PA-C   benzonatate (TESSALON) 100 MG capsule  Take 1 capsule (100 mg total) by mouth every 8 (eight) hours. 21 capsule Hall-Potvin, [place], PA-C   predniSONE (DELTASONE) 50 MG tablet Take 1 tablet (50 mg total) by mouth daily with breakfast. 5 tablet Hall-Potvin, [place], PA-C     PDMP not reviewed this encounter.   Hall-Potvin, [place], [place] 06/09/20 1617

## 2020-06-09 NOTE — ED Triage Notes (Signed)
Pt here for increased asthma sx x 5 days; pt sts had negative covid test on Wednesday; pt sts used inhaler last at 1230 today

## 2020-07-05 DIAGNOSIS — R69 Illness, unspecified: Secondary | ICD-10-CM | POA: Diagnosis not present

## 2020-07-13 ENCOUNTER — Other Ambulatory Visit: Payer: Self-pay | Admitting: Family Medicine

## 2020-07-13 ENCOUNTER — Ambulatory Visit
Admission: RE | Admit: 2020-07-13 | Discharge: 2020-07-13 | Disposition: A | Discharge status: Home / Self Care | Payer: Medicare HMO | Source: Ambulatory Visit | Attending: Family Medicine | Admitting: Family Medicine

## 2020-07-13 ENCOUNTER — Other Ambulatory Visit: Payer: Self-pay

## 2020-07-13 DIAGNOSIS — M25552 Pain in left hip: Secondary | ICD-10-CM

## 2020-07-13 DIAGNOSIS — M25551 Pain in right hip: Secondary | ICD-10-CM

## 2020-07-13 DIAGNOSIS — M545 Low back pain, unspecified: Secondary | ICD-10-CM

## 2020-07-31 DIAGNOSIS — L298 Other pruritus: Secondary | ICD-10-CM | POA: Diagnosis not present

## 2020-08-11 ENCOUNTER — Other Ambulatory Visit: Payer: Self-pay

## 2020-08-11 ENCOUNTER — Ambulatory Visit: Payer: Medicare HMO | Attending: Family Medicine | Admitting: Physical Therapy

## 2020-08-11 ENCOUNTER — Encounter: Payer: Self-pay | Admitting: Physical Therapy

## 2020-08-11 DIAGNOSIS — M6281 Muscle weakness (generalized): Secondary | ICD-10-CM | POA: Diagnosis not present

## 2020-08-11 DIAGNOSIS — G8929 Other chronic pain: Secondary | ICD-10-CM | POA: Diagnosis not present

## 2020-08-11 DIAGNOSIS — M25551 Pain in right hip: Secondary | ICD-10-CM | POA: Insufficient documentation

## 2020-08-11 DIAGNOSIS — M25552 Pain in left hip: Secondary | ICD-10-CM | POA: Insufficient documentation

## 2020-08-11 DIAGNOSIS — M545 Low back pain, unspecified: Secondary | ICD-10-CM | POA: Diagnosis not present

## 2020-08-11 NOTE — Therapy (Signed)
[facility] Outpatient Rehabilitation [facility] [address] [place], [place], [address] Phone: [phone]   Fax:  [phone]  Physical Therapy Evaluation  Patient Details  Name: Lisa Benjamin MRN: [id number] Date of Birth: 1956-03-23 Referring Provider (PT): Wilfrid Lund, [place]   Encounter Date: 08/11/2020   PT End of Session - 08/11/20 1353    Visit Number 1    Number of Visits 8    Date for PT Re-Evaluation 10/06/20    Authorization Type A[employer] MCR    Authorization Time Period FOTO by 6th visit    Progress Note Due on Visit 10    PT Start Time 1345    PT Stop Time 1430    PT Time Calculation (min) 45 min    Activity Tolerance Patient tolerated treatment well    Behavior During Therapy [facility] for tasks assessed/performed           Past Medical History:  Diagnosis Date  . Microscopic hematuria 11/04   negative work up with Dr. Isabel Caprice  . Osteopenia   . Vitamin D deficiency     Past Surgical History:  Procedure Laterality Date  . CESAREAN SECTION     x2  . ROTATOR CUFF REPAIR Left 12/2005  . TUBAL LIGATION Bilateral 1991    There were no vitals filed for this visit.    Subjective Assessment - 08/11/20 1346    Subjective Patient reports patient report started with pain and fatigue in the front of her thighs, then she started getting intermittent numbness in 2nd and 3rd toes on right, and currently also having pain in both hips and low back. This has been going on for > 1 year and the pain in lower back, hips, and thighs has been getting progressively worse. Patient does note she went to a boot camp workout around 2 years ago that started her pain in the lower back.    Pertinent History Previous work related back injury    Limitations Standing;Lifting;Walking;House hold activities    How long can you sit comfortably? No limitation    How long can you stand comfortably? "A couple hours"    How long can you walk comfortably? "A couple hours"     Diagnostic tests X-ray    Patient Stated Goals Pain relief    Currently in Pain? Yes    Pain Score 4     Pain Location Back    Pain Orientation Lower;Left    Pain Descriptors / Indicators Aching    Pain Type Chronic pain    Pain Radiating Towards posterior pelvic area, also reporting numbness/tingling 2nd and 3rd toes on right    Pain Onset More than a month ago    Pain Frequency Intermittent    Aggravating Factors  Standing, bending or stooping a lot, walkng extended periods    Pain Relieving Factors Sitting and rest    Effect of Pain on Daily Activities Patient reports a little limitation with household tasks              [facility] PT Assessment - 08/11/20 0001      Assessment   Medical Diagnosis Low back pain    Referring Provider (PT) Wilfrid Lund, PA    Onset Date/Surgical Date --   ongoing pain > 1 year   Prior Therapy Yes - LBP and rotator cuff surgery      Precautions   Precautions None      Restrictions   Weight Bearing Restrictions No  Balance Screen   Has the patient fallen in the past 6 months No    Has the patient had a decrease in activity level because of a fear of falling?  No    Is the patient reluctant to leave their home because of a fear of falling?  No      Prior Function   Level of Independence Independent    Vocation Retired      Copy Status Within Functional Limits for tasks assessed      Observation/Other Assessments   Observations Patient appears in no apparent distress    Focus on Therapeutic Outcomes (FOTO)  Hip 72% functional status, Lumbar 75% functional status      Sensation   Light Touch Appears Intact      Coordination   Gross Motor Movements are Fluid and Coordinated Yes      Posture/Postural Control   Posture Comments Increased lumbar lordosis with anterior pelvic tilt      ROM / Strength   AROM / PROM / Strength AROM;PROM;Strength      AROM   AROM Assessment Site Lumbar    Lumbar Flexion WFL  - increased bilateral low back dicomfort    Lumbar Extension WFL - increased left sided low back pain    Lumbar - Right Side Bend WFL - increased right low back pain    Lumbar - Left Side Bend WFL - increased left sided low back pain    Lumbar - Right Rotation WFL - increased left low back pain    Lumbar - Left Rotation WFL - increased left low back pain      PROM   Overall PROM Comments Hip PROM grossly WFL and non-painful      Strength   Strength Assessment Site Hip;Knee;Ankle    Right/Left Hip Right;Left    Right Hip Flexion 4/5    Right Hip Extension 4-/5    Right Hip ABduction 4-/5    Left Hip Flexion 4/5    Left Hip Extension 4-/5    Left Hip ABduction 4-/5    Right/Left Knee Right;Left    Right Knee Flexion 5/5    Right Knee Extension 5/5    Left Knee Flexion 5/5    Left Knee Extension 5/5    Right/Left Ankle Right;Left    Right Ankle Dorsiflexion 5/5    Right Ankle Eversion 5/5    Left Ankle Dorsiflexion 5/5    Left Ankle Eversion 5/5      Flexibility   Soft Tissue Assessment /Muscle Length yes    Hamstrings WFL    Quadriceps Limited bilaterally   + hip flexors     Palpation   Spinal mobility WFL - increased localized discomfort with CPA    SI assessment  Not assessed    Palpation comment TTP over bilateral GT, left lumbar paraspinals      Special Tests   Other special tests Radicular lumbar testing negative      Transfers   Transfers Independent with all Transfers                      Objective measurements completed on examination: See above findings.       [facility] Adult PT Treatment/Exercise - 08/11/20 0001      Exercises   Exercises Lumbar      Lumbar Exercises: Stretches   Other Lumbar Stretch Exercise Child's pose stretch fwd x 20 sec, lateral x 20 sec each  Lumbar Exercises: Supine   Bridge 10 reps;5 seconds    Bridge Limitations yellow band around knees      Lumbar Exercises: Sidelying   Clam 10 reps    Clam Limitations  yellow band      Lumbar Exercises: Quadruped   Madcat/Old Horse 10 reps    Madcat/Old Horse Limitations cued for pelvic control                  PT Education - 08/11/20 1353    Education Details Exam findings, possible etiology of symptoms, POC, HEP    Person(s) Educated Patient    Methods Explanation;Demonstration;Tactile cues;Verbal cues;Handout    Comprehension Verbalized understanding;Returned demonstration;Verbal cues required;Tactile cues required;Need further instruction            PT Short Term Goals - 08/11/20 1354      PT SHORT TERM GOAL #1   Title Patient will be I with initial HEP to progress with PT    Time 4    Period Weeks    Status New    Target Date 09/08/20      PT SHORT TERM GOAL #2   Title PT will review FOTO with patient by 3rd visit    Time 3    Period Weeks    Status New    Target Date 09/01/20      PT SHORT TERM GOAL #3   Title Patient will report </= 1/10 resting pain level to reduce functional limitation    Time 4    Period Weeks    Status New    Target Date 09/08/20             PT Long Term Goals - 08/11/20 1355      PT LONG TERM GOAL #1   Title Patient will be I with final HEP to maintain progress from PT    Time 8    Period Weeks    Status New    Target Date 10/06/20      PT LONG TERM GOAL #2   Title Patient will report improved functional status to >/= Hip 78% functional status, Lumbar 78% functional status% on FOTO    Time 8    Period Weeks    Status New    Target Date 10/06/20      PT LONG TERM GOAL #3   Title Patient will demonstrate >/= 4+/5 MMT for hip and core strength to reduce pain and improve lifting ability    Time 8    Period Weeks    Status New    Target Date 10/06/20      PT LONG TERM GOAL #4   Title Patient will report no limitation with standing, walking, or heavy household tasks    Time 8    Period Weeks    Status New    Target Date 10/06/20      PT LONG TERM GOAL #5   Title Patient will  demonstrate lumbar AROM WFL and non-painful to improve ability to stoop while working in yard without limitation    Time 8    Period Weeks    Status New    Target Date 10/06/20                  Plan - 08/11/20 1400    Clinical Impression Statement Patient presents to PT with report of chronic low back, bilateral hip and thigh pain, right 2nd/3rd toe numbness and tingling. She currently exhibits good lumbar motion but  with pain in all directions, gross core and hip weakness with pain in abduction, TTP over left lumbar paraspinals and GT bilaterally, increased lumbar lordosis with hip flexor tightness. 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 She was provided exercises to initiate core and hip strengthening and she would benefit from continued skilled PT to progress for strength and mobility to reduce pain and perform all household tasks without limitation.    Personal Factors and Comorbidities Time since onset of injury/illness/exacerbation;Fitness    Examination-Activity Limitations Locomotion Level;Lift;Bend    Examination-Participation Restrictions Cleaning;Yard Work;Community Activity;Shop    Stability/Clinical Decision Making Stable/Uncomplicated    Clinical Decision Making Low    Rehab Potential Good    PT Frequency 1x / week    PT Duration 8 weeks    PT Treatment/Interventions ADLs/Self Care Home Management;Cryotherapy;Electrical Stimulation;Iontophoresis 4mg /ml Dexamethasone;Moist Heat;Traction;Ultrasound;Neuromuscular re-education;Balance training;Therapeutic exercise;Therapeutic activities;Functional mobility training;Stair training;Gait training;Patient/family education;Dry needling;Passive range of motion;Manual techniques;Taping;Spinal Manipulations;Joint Manipulations    PT Next Visit Plan Review HEP and progress  PRN, hip flexor/lumbar paraspinal stretching, progress core and hip strengthening    PT Home Exercise Plan [email or web address]: bridge with yellow, clamshell with yellow, cat camel, child's pose stretch    Consulted and Agree with Plan of Care Patient           Patient will benefit from skilled therapeutic intervention in order to improve the following deficits and impairments:  Decreased strength, Postural dysfunction, Improper body mechanics, Impaired flexibility, Decreased activity tolerance, Pain  Visit Diagnosis: Chronic bilateral low back pain, unspecified whether sciatica present  Pain in left hip  Pain in right hip  Muscle weakness (generalized)     Problem List There are no problems to display for this patient.   Rosana Hoesampbell Areebah Meinders, PT, DPT, LAT, ATC 08/11/20  2:52 PM Phone: [phone] Fax: [phone]   [facility] [address] [place], [place], [address] Phone: [phone]   Fax:  [phone]  Name: Lisa Benjamin MRN: [id number] Date of Birth: [birth year]

## 2020-08-11 NOTE — Patient Instructions (Signed)
Access Code: [email or web address] URL: [email or web address] Date: 08/11/2020 Prepared by: Rosana Hoes  Exercises Supine Bridge with Resistance Band - 1 x daily - 7 x weekly - 2 sets - 10 reps - 5 seconds hold Clamshell with Resistance - 1 x daily - 7 x weekly - 3 sets - 10 reps Cat-Camel - 1 x daily - 7 x weekly - 2 sets - 10 reps Child's Pose Stretch - 1 x daily - 7 x weekly - 2 reps - 20 seconds hold Child's Pose with Sidebending - 1 x daily - 7 x weekly - 2 reps - 20 seconds hold

## 2020-08-17 ENCOUNTER — Other Ambulatory Visit: Payer: Self-pay

## 2020-08-17 ENCOUNTER — Ambulatory Visit: Payer: Medicare HMO | Attending: Family Medicine

## 2020-08-17 DIAGNOSIS — M25552 Pain in left hip: Secondary | ICD-10-CM | POA: Diagnosis not present

## 2020-08-17 DIAGNOSIS — M545 Low back pain, unspecified: Secondary | ICD-10-CM | POA: Diagnosis not present

## 2020-08-17 DIAGNOSIS — G8929 Other chronic pain: Secondary | ICD-10-CM | POA: Insufficient documentation

## 2020-08-17 DIAGNOSIS — M6281 Muscle weakness (generalized): Secondary | ICD-10-CM | POA: Insufficient documentation

## 2020-08-17 DIAGNOSIS — M25551 Pain in right hip: Secondary | ICD-10-CM | POA: Insufficient documentation

## 2020-08-17 NOTE — Therapy (Signed)
[facility] Outpatient Rehabilitation [facility] [address] [place], [place], [address] Phone: [phone]   Fax:  [phone]  Physical Therapy Treatment  Patient Details  Name: Lisa Benjamin MRN: [id number] Date of Birth: Sep 22, 1956 Referring Provider (PT): Wilfrid Lund, [place]   Encounter Date: 08/17/2020   PT End of Session - 08/17/20 1356    Visit Number 2    Number of Visits 8    Date for PT Re-Evaluation 10/06/20    Authorization Type Aetna MCR    Authorization Time Period FOTO by 6th visit    Progress Note Due on Visit 10    PT Start Time 1358    PT Stop Time 1440    PT Time Calculation (min) 42 min    Activity Tolerance Patient tolerated treatment well    Behavior During Therapy [facility] for tasks assessed/performed           Past Medical History:  Diagnosis Date  . Microscopic hematuria 11/04   negative work up with Dr. Isabel Caprice  . Osteopenia   . Vitamin D deficiency     Past Surgical History:  Procedure Laterality Date  . CESAREAN SECTION     x2  . ROTATOR CUFF REPAIR Left 12/2005  . TUBAL LIGATION Bilateral 1991    There were no vitals filed for this visit.   Subjective Assessment - 08/17/20 1355    Subjective Patient states the pain in her B hips is no longer present, but she continues to have low back pain.    Pertinent History Previous work related back injury    Limitations Standing;Lifting;Walking;House hold activities    How long can you sit comfortably? No limitation    How long can you stand comfortably? "A couple hours"    How long can you walk comfortably? "A couple hours"    Diagnostic tests X-ray    Patient Stated Goals Pain relief    Currently in Pain? No/denies    Pain Onset More than a month ago              [facility] PT Assessment - 08/17/20 0001      Assessment   Medical Diagnosis Low back pain    Referring Provider (PT) Wilfrid Lund, PA                         [facility] Adult PT Treatment/Exercise  - 08/17/20 0001      Self-Care   Self-Care Other Self-Care Comments    Other Self-Care Comments  HEP and to perform cat/camel with FL to neutral range for now until pt is able to tolerate EXT without increased pain. Pt education regarding centralization vs. peripheralization of symptoms.      Exercises   Exercises Knee/Hip      Lumbar Exercises: Stretches   Other Lumbar Stretch Exercise Child's pose stretch fwd x 20 sec, lateral x 20 sec each      Lumbar Exercises: Aerobic   Recumbent Bike L3 x 5 min      Lumbar Exercises: Standing   Functional Squats 15 reps    Functional Squats Limitations at counter with cues for form      Lumbar Exercises: Seated   Other Seated Lumbar Exercises Lumbar forward and lateral FL with green physioball 5 x 5 sec in each direction      Lumbar Exercises: Supine   Pelvic Tilt 20 reps    Pelvic Tilt Limitations cues for form initially  Bridge 20 reps;5 seconds    Bridge Limitations yellow band around knees      Lumbar Exercises: Sidelying   Clam 15 reps    Clam Limitations yellow band      Lumbar Exercises: Quadruped   Madcat/Old Horse 10 reps    Madcat/Old Horse Limitations FL back to neutral due to increased pain with lumbar EXT during this      Knee/Hip Exercises: Stretches   Other Knee/Hip Stretches Modified Thomas stretch 2 x 30 sec each LE with slight overpressure from PT into involved knee FL      Knee/Hip Exercises: Supine   Straight Leg Raises Strengthening;Both;20 reps    Straight Leg Raises Limitations 2# ankle weight      Knee/Hip Exercises: Sidelying   Hip ABduction Strengthening;Both;15 reps    Hip ABduction Limitations 2# ankle weights      Manual Therapy   Manual Therapy Passive ROM    Passive ROM Passive B QL stretch 2 x 30 sec each                  PT Education - 08/17/20 1443    Education Details HEP and to perform cat/camel with FL to neutral range for now until pt is able to tolerate EXT without increased  pain. Pt education regarding centralization vs. peripheralization of symptoms.    Person(s) Educated Patient    Methods Explanation;Demonstration;Tactile cues;Verbal cues    Comprehension Verbalized understanding;Returned demonstration;Verbal cues required;Tactile cues required            PT Short Term Goals - 08/11/20 1354      PT SHORT TERM GOAL #1   Title Patient will be I with initial HEP to progress with PT    Time 4    Period Weeks    Status New    Target Date 09/08/20      PT SHORT TERM GOAL #2   Title PT will review FOTO with patient by 3rd visit    Time 3    Period Weeks    Status New    Target Date 09/01/20      PT SHORT TERM GOAL #3   Title Patient will report </= 1/10 resting pain level to reduce functional limitation    Time 4    Period Weeks    Status New    Target Date 09/08/20             PT Long Term Goals - 08/11/20 1355      PT LONG TERM GOAL #1   Title Patient will be I with final HEP to maintain progress from PT    Time 8    Period Weeks    Status New    Target Date 10/06/20      PT LONG TERM GOAL #2   Title Patient will report improved functional status to >/= Hip 78% functional status, Lumbar 78% functional status% on FOTO    Time 8    Period Weeks    Status New    Target Date 10/06/20      PT LONG TERM GOAL #3   Title Patient will demonstrate >/= 4+/5 MMT for hip and core strength to reduce pain and improve lifting ability    Time 8    Period Weeks    Status New    Target Date 10/06/20      PT LONG TERM GOAL #4   Title Patient will report no limitation with standing, walking, or heavy household tasks  Time 8    Period Weeks    Status New    Target Date 10/06/20      PT LONG TERM GOAL #5   Title Patient will demonstrate lumbar AROM WFL and non-painful to improve ability to stoop while working in yard without limitation    Time 8    Period Weeks    Status New    Target Date 10/06/20                 Plan -  08/17/20 1357    Clinical Impression Statement Pt tolerated addition of interventions when stating she "feels muscles working" but with no complaints of significant increase in pain except during trunk EXT portion of cat/camel. Pt instructed to perform cat/camel within FL-neutral spine ranges for now until she is able to better tolerate EXT. Patient shows improvement from evaluation to today with relief of radicular symptoms after performing HEP consistently.    Personal Factors and Comorbidities Time since onset of injury/illness/exacerbation;Fitness    Examination-Activity Limitations Locomotion Level;Lift;Bend    Examination-Participation Restrictions Cleaning;Yard Work;Community Activity;Shop    Stability/Clinical Decision Making Stable/Uncomplicated    Rehab Potential Good    PT Frequency 1x / week    PT Duration 8 weeks    PT Treatment/Interventions ADLs/Self Care Home Management;Cryotherapy;Electrical Stimulation;Iontophoresis 4mg /ml Dexamethasone;Moist Heat;Traction;Ultrasound;Neuromuscular re-education;Balance training;Therapeutic exercise;Therapeutic activities;Functional mobility training;Stair training;Gait training;Patient/family education;Dry needling;Passive range of motion;Manual techniques;Taping;Spinal Manipulations;Joint Manipulations    PT Next Visit Plan Review HEP and progress PRN, hip flexor/lumbar paraspinal stretching, progress core and hip strengthening    PT Home Exercise Plan [email or web address]: bridge with yellow, clamshell with yellow, cat camel, child's pose stretch    Consulted and Agree with Plan of Care Patient           Patient will benefit from skilled therapeutic intervention in order to improve the following deficits and impairments:  Decreased strength, Postural dysfunction, Improper body mechanics, Impaired flexibility, Decreased activity tolerance, Pain  Visit Diagnosis: Chronic bilateral low back pain, unspecified whether sciatica present  Pain in left  hip  Pain in right hip  Muscle weakness (generalized)     Problem List There are no problems to display for this patient.    , PT, DPT 08/17/20 4:32 PM  [facility] Outpatient Rehabilitation [facility] [address] [place], [place], [place] Phone: [phone]   Fax:  [phone]  Name: Lisa Benjamin MRN: Angus Palms Date of Birth: 13-Jun-1956

## 2020-08-22 ENCOUNTER — Ambulatory Visit: Payer: Medicare HMO

## 2020-08-22 ENCOUNTER — Other Ambulatory Visit: Payer: Self-pay

## 2020-08-22 DIAGNOSIS — M25552 Pain in left hip: Secondary | ICD-10-CM

## 2020-08-22 DIAGNOSIS — M25551 Pain in right hip: Secondary | ICD-10-CM | POA: Diagnosis not present

## 2020-08-22 DIAGNOSIS — M6281 Muscle weakness (generalized): Secondary | ICD-10-CM

## 2020-08-22 DIAGNOSIS — M545 Low back pain, unspecified: Secondary | ICD-10-CM | POA: Diagnosis not present

## 2020-08-22 DIAGNOSIS — G8929 Other chronic pain: Secondary | ICD-10-CM | POA: Diagnosis not present

## 2020-08-22 NOTE — Therapy (Signed)
[place], [place], [address] Phone: [phone]   Fax:  [phone]  Physical Therapy Treatment  Patient Details  Name: Cassidee Deats MRN: [id number] Date of Birth: [birth year] Referring Provider (PT): Lois Huxley, [place]   Encounter Date: 08/22/2020   PT End of Session - 08/22/20 1550    Visit Number 3    Number of Visits 8    Date for PT Re-Evaluation 10/06/20    Authorization Type [employer] MCR    Authorization Time Period FOTO by 6th visit    Progress Note Due on Visit 10    PT Start Time 1400    PT Stop Time 1443    PT Time Calculation (min) 43 min    Activity Tolerance Patient tolerated treatment well    Behavior During Therapy [facility] for tasks assessed/performed           Past Medical History:  Diagnosis Date  . Microscopic hematuria 11/04   negative work up with Dr. Risa Grill  . Osteopenia   . Vitamin D deficiency     Past Surgical History:  Procedure Laterality Date  . CESAREAN SECTION     x2  . ROTATOR CUFF REPAIR Left 12/2005  . TUBAL LIGATION Bilateral 1991    There were no vitals filed for this visit.   Subjective Assessment - 08/22/20 1406    Subjective Patient states she has pain in lower left leg mostly but also has aggravation of low back and bilateral hip pain with numbness in 2nd and 3rd toes of R foot. Pt also reports burning across low back that began this weekend. Pt explains that her knee pain intially began while performing standing hip ABD at home without weights Thursday last week. She states she may have also "overdone it" while painting occasionally over the past several days. Pt explains that she was walking off/on step stool often and in a tall kneeling position while painting, which aggravated knee pain.  Pt walks back to therapy gym with antalgic gait due to pain along L medial knee joint line and along popliteal region.    Pertinent History Previous work related back  injury    Limitations Standing;Lifting;Walking;House hold activities    How long can you sit comfortably? No limitation    How long can you stand comfortably? "A couple hours"    How long can you walk comfortably? "A couple hours"    Diagnostic tests X-ray    Patient Stated Goals Pain relief    Currently in Pain? Yes    Pain Score 2     Pain Location Back    Pain Orientation Lower;Left    Pain Descriptors / Indicators Aching;Burning    Pain Onset More than a month ago    Multiple Pain Sites Yes    Pain Score 2    Pain Location Leg    Pain Orientation Lower;Left    Pain Descriptors / Indicators Tightness    Pain Type Acute pain    Pain Onset In the past 7 days    Pain Frequency Intermittent              [facility] PT Assessment - 08/22/20 0001      Assessment   Medical Diagnosis Low back pain    Referring Provider (PT) Lois Huxley, PA      Special Tests    Special Tests Knee Special Tests    Other special tests Minimal posterior rotation of the L innominate  was noted during a long sit test. PT began MET for correction but held due to patient's knee pain.    Knee Special tests  other      other    Comments (-) Apley Compression test; "a little" pain during [place]ssaly test                         [facility] Adult PT Treatment/Exercise - 08/22/20 0001      Self-Care   Self-Care Other Self-Care Comments    Other Self-Care Comments  Reviewed HEP and instructed pt to hold off on performing standing hip ABD at home for now. PT advised pt to contact her doctor for imaging and/or referral for knee pain if pain does not ease or subside with RICE over the next few days.      Lumbar Exercises: Aerobic   Recumbent Bike L2 x 5 min      Lumbar Exercises: Seated   Other Seated Lumbar Exercises Lumbar forward and lateral FL with green physioball 10 x 5 sec in each direction      Lumbar Exercises: Supine   Pelvic Tilt 20 reps    Pelvic Tilt Limitations cues for form  initially    Bridge with Ball Squeeze 20 reps;5 seconds      Lumbar Exercises: Quadruped   Madcat/Old Horse 10 reps    Madcat/Old Horse Limitations cues for form and TrA activation    Other Quadruped Lumbar Exercises Bird dog with alternating LE only 10x each; cues for form      Knee/Hip Exercises: Stretches   Press photographer Left;2 reps;30 seconds      Manual Therapy   Manual Therapy Muscle Energy Technique    Passive ROM Supine L HS stretch 2 x 30 seconds    Muscle Energy Technique Minimal posterior rotation of the L innominate was noted during a long sit test. PT began MET for correction but held due to patient's knee pain.                  PT Education - 08/22/20 1612    Education Details Reviewed HEP and instructed pt to hold off on performing standing hip ABD at home for now. PT advised pt to contact her doctor for imaging and/or referral for knee pain if pain does not ease or subside with RICE over the next few days.    Person(s) Educated Patient    Methods Explanation;Demonstration;Tactile cues;Verbal cues    Comprehension Verbalized understanding;Returned demonstration;Verbal cues required;Tactile cues required            PT Short Term Goals - 08/11/20 1354      PT SHORT TERM GOAL #1   Title Patient will be I with initial HEP to progress with PT    Time 4    Period Weeks    Status New    Target Date 09/08/20      PT SHORT TERM GOAL #2   Title PT will review FOTO with patient by 3rd visit    Time 3    Period Weeks    Status New    Target Date 09/01/20      PT SHORT TERM GOAL #3   Title Patient will report </= 1/10 resting pain level to reduce functional limitation    Time 4    Period Weeks    Status New    Target Date 09/08/20  PT Long Term Goals - 08/11/20 1355      PT LONG TERM GOAL #1   Title Patient will be I with final HEP to maintain progress from PT    Time 8    Period Weeks    Status New    Target Date 10/06/20       PT LONG TERM GOAL #2   Title Patient will report improved functional status to >/= Hip 78% functional status, Lumbar 78% functional status% on FOTO    Time 8    Period Weeks    Status New    Target Date 10/06/20      PT LONG TERM GOAL #3   Title Patient will demonstrate >/= 4+/5 MMT for hip and core strength to reduce pain and improve lifting ability    Time 8    Period Weeks    Status New    Target Date 10/06/20      PT LONG TERM GOAL #4   Title Patient will report no limitation with standing, walking, or heavy household tasks    Time 8    Period Weeks    Status New    Target Date 10/06/20      PT LONG TERM GOAL #5   Title Patient will demonstrate lumbar AROM WFL and non-painful to improve ability to stoop while working in yard without limitation    Time 8    Period Weeks    Status New    Target Date 10/06/20                 Plan - 08/22/20 1558    Clinical Impression Statement Pt was able to perform interventions for her LBP without complaints of significant increase in pain or presence of numbness and tingling in 2nd-3rd toes of R foot. Pt did however have increased TTP across low back and at B SI joints.  A minimal posterior rotation of the L innominate was noted during a long sit test. PT began MET for correction but held due to patient's knee pain. Pt with TTP along L medial joint line today and aggravated low back and hip symptoms that is potentially worsened due to antalgic gait pattern secondary to knee pain. Pt with (-) Apley Compression test but states "a little bit" when asked about pain during [facility] test. PT advised pt to contact her doctor for imaging and/or referral for knee pain if pain does not ease or subside with RICE over the next few days.    Personal Factors and Comorbidities Time since onset of injury/illness/exacerbation;Fitness    Examination-Activity Limitations Locomotion Level;Lift;Bend    Examination-Participation Restrictions Cleaning;Yard  Work;Community Activity;Shop    Stability/Clinical Decision Making Stable/Uncomplicated    Rehab Potential Good    PT Frequency 1x / week    PT Duration 8 weeks    PT Treatment/Interventions ADLs/Self Care Home Management;Cryotherapy;Electrical Stimulation;Iontophoresis 96m/ml Dexamethasone;Moist Heat;Traction;Ultrasound;Neuromuscular re-education;Balance training;Therapeutic exercise;Therapeutic activities;Functional mobility training;Stair training;Gait training;Patient/family education;Dry needling;Passive range of motion;Manual techniques;Taping;Spinal Manipulations;Joint Manipulations    PT Next Visit Plan Review HEP and progress PRN, hip flexor/lumbar paraspinal stretching, progress core and hip strengthening, reassess long sit test if needed    PT Home Exercise Plan [id number]: bridge with yellow, clamshell with yellow, cat camel, child's pose stretch    Consulted and Agree with Plan of Care Patient           Patient will benefit from skilled therapeutic intervention in order to improve the following deficits and impairments:  Decreased strength, Postural  dysfunction, Improper body mechanics, Impaired flexibility, Decreased activity tolerance, Pain  Visit Diagnosis: Chronic bilateral low back pain, unspecified whether sciatica present  Pain in left hip  Pain in right hip  Muscle weakness (generalized)     Problem List There are no problems to display for this patient.   Haydee Monica, PT, DPT 08/22/20 4:22 PM  [facility] [address] [place], [place], [address] Phone: [phone]   Fax:  [phone]  Name: October Peery MRN: [id number] Date of Birth: Feb 13, 1956

## 2020-08-29 ENCOUNTER — Other Ambulatory Visit: Payer: Self-pay

## 2020-08-29 ENCOUNTER — Ambulatory Visit: Payer: Medicare HMO

## 2020-08-29 DIAGNOSIS — M545 Low back pain, unspecified: Secondary | ICD-10-CM

## 2020-08-29 DIAGNOSIS — M6281 Muscle weakness (generalized): Secondary | ICD-10-CM | POA: Diagnosis not present

## 2020-08-29 DIAGNOSIS — G8929 Other chronic pain: Secondary | ICD-10-CM | POA: Diagnosis not present

## 2020-08-29 DIAGNOSIS — M25552 Pain in left hip: Secondary | ICD-10-CM | POA: Diagnosis not present

## 2020-08-29 DIAGNOSIS — M25551 Pain in right hip: Secondary | ICD-10-CM | POA: Diagnosis not present

## 2020-08-29 NOTE — Therapy (Addendum)
[facility] [address] [place], [place], [address] Phone: [phone]   Fax:  [phone]  Physical Therapy Treatment  Patient Details  Name: Lisa Benjamin MRN: [id number] Date of Birth: 09-Nov-1955 Referring Provider (PT): Wilfrid Lund, [place]   Encounter Date: 08/29/2020   PT End of Session - 08/29/20 0913    Visit Number 4    Number of Visits 8    Date for PT Re-Evaluation 10/06/20    Authorization Type [employer] MCR    Authorization Time Period FOTO by 6th visit    Progress Note Due on Visit 10    PT Start Time 0915    PT Stop Time 0958    PT Time Calculation (min) 43 min    Activity Tolerance Patient tolerated treatment well    Behavior During Therapy [facility] for tasks assessed/performed           Past Medical History:  Diagnosis Date  . Microscopic hematuria 11/04   negative work up with Dr. Isabel Caprice  . Osteopenia   . Vitamin D deficiency     Past Surgical History:  Procedure Laterality Date  . CESAREAN SECTION     x2  . ROTATOR CUFF REPAIR Left 12/2005  . TUBAL LIGATION Bilateral 1991    There were no vitals filed for this visit.   Subjective Assessment - 08/29/20 0913    Subjective Patient reports that her L knee pain has been hurting from the back of her knee up her posterior thigh. Pt states she was barely able to even walk yesterday, and her husband used a roller on the back of her thigh to provide some pain relief. Pt reports her L knee pain worsened after finishing clamshells and quadruped exercises yesterday.    Pertinent History Previous work related back injury    Limitations Standing;Lifting;Walking;House hold activities    How long can you sit comfortably? No limitation    How long can you stand comfortably? "A couple hours"    How long can you walk comfortably? "A couple hours"    Diagnostic tests X-ray    Patient Stated Goals Pain relief    Currently in Pain? Yes    Pain Score 6     Pain Location  Back    Pain Orientation Lower;Right    Pain Descriptors / Indicators Aching    Pain Type Chronic pain    Pain Onset More than a month ago    Pain Score 6    Pain Location Knee    Pain Orientation Left    Pain Descriptors / Indicators Tightness    Pain Type Acute pain    Pain Onset In the past 7 days              [facility] PT Assessment - 08/29/20 0001      Assessment   Medical Diagnosis Low back pain    Referring Provider (PT) Wilfrid Lund, [place]                         [facility] Adult PT Treatment/Exercise - 08/29/20 0001      Self-Care   Self-Care Other Self-Care Comments    Other Self-Care Comments  Reviewed HEP and instructed pt to perform the exercises she can without pain/exacerbation of L knee or low back symptoms; advised pt once again to contact her doctor if L knee pain remains consistent and/or worsens.      Lumbar Exercises: Stretches  Piriformis Stretch Right;3 reps;30 seconds      Lumbar Exercises: Aerobic   Recumbent Bike L3 x 3 min      Lumbar Exercises: Standing   Wall Slides 20 reps      Lumbar Exercises: Seated   Sit to Stand 20 reps      Lumbar Exercises: Supine   Pelvic Tilt 20 reps    Pelvic Tilt Limitations dead bugs with several cues for form/technique    Bridge 20 reps;5 seconds      Knee/Hip Exercises: Stretches   Active Hamstring Stretch 2 reps;30 seconds    Active Hamstring Stretch Limitations green strap; within pain-free range      Knee/Hip Exercises: Seated   Hamstring Curl Strengthening;Both;20 reps    Hamstring Limitations yellow theraband      Manual Therapy   Manual Therapy Soft tissue mobilization;Myofascial release    Soft tissue mobilization STM, myofascial release, and IASTM along L HS and R piriformis                  PT Education - 08/29/20 0913    Education Details Reviewed HEP and instructed pt to perform the exercises she can without pain/exacerbation of L knee or low back symptoms; advised pt  once again to contact her doctor if L knee pain remains consistent and/or worsens.    Person(s) Educated Patient    Methods Explanation;Demonstration;Tactile cues;Verbal cues    Comprehension Verbalized understanding;Returned demonstration;Verbal cues required;Tactile cues required            PT Short Term Goals - 08/11/20 1354      PT SHORT TERM GOAL #1   Title Patient will be I with initial HEP to progress with PT    Time 4    Period Weeks    Status New    Target Date 09/08/20      PT SHORT TERM GOAL #2   Title PT will review FOTO with patient by 3rd visit    Time 3    Period Weeks    Status New    Target Date 09/01/20      PT SHORT TERM GOAL #3   Title Patient will report </= 1/10 resting pain level to reduce functional limitation    Time 4    Period Weeks    Status New    Target Date 09/08/20             PT Long Term Goals - 08/11/20 1355      PT LONG TERM GOAL #1   Title Patient will be I with final HEP to maintain progress from PT    Time 8    Period Weeks    Status New    Target Date 10/06/20      PT LONG TERM GOAL #2   Title Patient will report improved functional status to >/= Hip 78% functional status, Lumbar 78% functional status% on FOTO    Time 8    Period Weeks    Status New    Target Date 10/06/20      PT LONG TERM GOAL #3   Title Patient will demonstrate >/= 4+/5 MMT for hip and core strength to reduce pain and improve lifting ability    Time 8    Period Weeks    Status New    Target Date 10/06/20      PT LONG TERM GOAL #4   Title Patient will report no limitation with standing, walking, or heavy household tasks  Time 8    Period Weeks    Status New    Target Date 10/06/20      PT LONG TERM GOAL #5   Title Patient will demonstrate lumbar AROM WFL and non-painful to improve ability to stoop while working in yard without limitation    Time 8    Period Weeks    Status New    Target Date 10/06/20                 Plan -  08/29/20 0913    Clinical Impression Statement Increased emphasis on manual therapy today due to decreased tolerance to interventions secondary to L knee and R hip pain. Pt presents with TTP along R piriformis and L HS that was slightly eased with STM, IASTM, and myofascial release. Pt's previous tenderness along L medial joint line is no longer apparent upon palpation.    Personal Factors and Comorbidities Time since onset of injury/illness/exacerbation;Fitness    Examination-Activity Limitations Locomotion Level;Lift;Bend    Examination-Participation Restrictions Cleaning;Yard Work;Community Activity;Shop    Stability/Clinical Decision Making Stable/Uncomplicated    Rehab Potential Good    PT Frequency 1x / week    PT Duration 8 weeks    PT Treatment/Interventions ADLs/Self Care Home Management;Cryotherapy;Electrical Stimulation;Iontophoresis 4mg /ml Dexamethasone;Moist Heat;Traction;Ultrasound;Neuromuscular re-education;Balance training;Therapeutic exercise;Therapeutic activities;Functional mobility training;Stair training;Gait training;Patient/family education;Dry needling;Passive range of motion;Manual techniques;Taping;Spinal Manipulations;Joint Manipulations    PT Next Visit Plan Review HEP and progress PRN, hip flexor/lumbar paraspinal stretching, reassess long sit test if needed, progress core and BLE strengthening ([facility] strength if tolerated)    PT Home Exercise Plan [id number]: bridge with yellow, clamshell with yellow, cat camel, child's pose stretch    Consulted and Agree with Plan of Care Patient           Patient will benefit from skilled therapeutic intervention in order to improve the following deficits and impairments:  Decreased strength, Postural dysfunction, Improper body mechanics, Impaired flexibility, Decreased activity tolerance, Pain  Visit Diagnosis: Chronic bilateral low back pain, unspecified whether sciatica present  Pain in left hip  Pain in right hip  Muscle  weakness (generalized)     Problem List There are no problems to display for this patient.    , PT, DPT 08/29/20 12:54 PM  [facility] [address] [place], [place], [place] Phone: [phone]   Fax:  [phone]  Name: Lisa Benjamin MRN: [id number] [id number] Date of Birth: Feb 13, 1956

## 2020-09-04 ENCOUNTER — Other Ambulatory Visit: Payer: Self-pay

## 2020-09-04 ENCOUNTER — Ambulatory Visit: Payer: Medicare HMO

## 2020-09-04 DIAGNOSIS — M25551 Pain in right hip: Secondary | ICD-10-CM | POA: Diagnosis not present

## 2020-09-04 DIAGNOSIS — M25552 Pain in left hip: Secondary | ICD-10-CM | POA: Diagnosis not present

## 2020-09-04 DIAGNOSIS — M6281 Muscle weakness (generalized): Secondary | ICD-10-CM | POA: Diagnosis not present

## 2020-09-04 DIAGNOSIS — M545 Low back pain, unspecified: Secondary | ICD-10-CM | POA: Diagnosis not present

## 2020-09-04 DIAGNOSIS — G8929 Other chronic pain: Secondary | ICD-10-CM | POA: Diagnosis not present

## 2020-09-04 NOTE — Therapy (Signed)
[facility] Rehabilitation [facility] [address] [place], [place], [address] Phone: [phone]   Fax:  [phone]  Physical Therapy Treatment  Patient Details  Name: Lisa Benjamin MRN: [id number] Date of Birth: Sep 05, 1956 Referring Provider (PT): Wilfrid Lund, [place]   Encounter Date: 09/04/2020   PT End of Session - 09/04/20 1043    Visit Number 5    Number of Visits 8    Date for PT Re-Evaluation 10/06/20    Authorization Type [employer] MCR    Authorization Time Period FOTO by 6th visit    Progress Note Due on Visit 10    PT Start Time 1000    PT Stop Time 1043    PT Time Calculation (min) 43 min    Activity Tolerance Patient tolerated treatment well    Behavior During Therapy [facility] for tasks assessed/performed           Past Medical History:  Diagnosis Date  . Microscopic hematuria 11/04   negative work up with Dr. Isabel Caprice  . Osteopenia   . Vitamin D deficiency     Past Surgical History:  Procedure Laterality Date  . CESAREAN SECTION     x2  . ROTATOR CUFF REPAIR Left 12/2005  . TUBAL LIGATION Bilateral 1991    There were no vitals filed for this visit.   Subjective Assessment - 09/04/20 1005    Subjective "I am still feeling tightness in my left knee and had some groin pain on the right just standing and walking for about 15 minutes the other day at the grocery store. I feel like I can't walk as long as I used to because of my left leg then my right leg starts hurting because I'm using it more. I pray on my knees everyday and have been pushing up from the right knee because I don't want to put that much pressure on the left knee. I've always prayed like that without a problem though. My back feels the same."    Pertinent History Previous work related back injury    Limitations Standing;Lifting;Walking;House hold activities    How long can you sit comfortably? No limitation    How long can you stand comfortably? "A couple hours"    How  long can you walk comfortably? "A couple hours"    Diagnostic tests X-ray    Patient Stated Goals Pain relief    Currently in Pain? No/denies    Pain Score 0-No pain    Pain Location Back    Pain Onset More than a month ago    Pain Score 4    Pain Location Knee    Pain Orientation Left    Pain Descriptors / Indicators Tightness    Pain Type Acute pain    Pain Onset 1 to 4 weeks ago              [facility] PT Assessment - 09/04/20 0001      Assessment   Medical Diagnosis Low back pain    Referring Provider (PT) Wilfrid Lund, [place]                         [facility] Adult PT Treatment/Exercise - 09/04/20 0001      Self-Care   Self-Care Other Self-Care Comments    Other Self-Care Comments  Reviewed HEP and discussed performing interventions within tolerance, POC, and discussed benefit of core and glute strengthening      Lumbar Exercises: Stretches  Piriformis Stretch Right;2 reps;30 seconds      Lumbar Exercises: Aerobic   Recumbent Bike L4 x 6 min      Lumbar Exercises: Standing   Other Standing Lumbar Exercises Pallof press with rotation using 1 green band x 10 each direction; cues for core activation      Lumbar Exercises: Seated   Other Seated Lumbar Exercises Hip hinge x 20    Other Seated Lumbar Exercises Lumbar forward and lateral FL with green physioball 5 x 5 sec in each direction      Lumbar Exercises: Supine   Pelvic Tilt 15 reps    Dead Bug 15 reps    Other Supine Lumbar Exercises PPT while pushing into red swiss ball with BUE x 20      Lumbar Exercises: Quadruped   Madcat/Old Horse 20 reps    Other Quadruped Lumbar Exercises Fire hydrant/quadruped hip horizontal ABD x 15 each LE      Knee/Hip Exercises: Stretches   Theme park manager Both;2 reps;30 seconds    Other Knee/Hip Stretches Modified Thomas stretch 2 x 30 sec RLE with gentle overpressure from PT      Knee/Hip Exercises: Seated   Hamstring Curl Strengthening;20 reps;Left    Hamstring  Limitations green theraband      Manual Therapy   Manual Therapy Soft tissue mobilization;Myofascial release    Soft tissue mobilization STM, myofascial release, and roller along R glute med and piriformis                  PT Education - 09/04/20 1118    Education Details Reviewed HEP and discussed performing interventions within tolerance, POC, and discussed benefit of core and glute strengthening. Discussed prayer position and to have a cushion under knees if needed.    Person(s) Educated Patient    Methods Explanation;Demonstration;Tactile cues;Verbal cues    Comprehension Verbalized understanding;Returned demonstration;Verbal cues required;Tactile cues required            PT Short Term Goals - 09/04/20 1111      PT SHORT TERM GOAL #1   Title Patient will be I with initial HEP to progress with PT    Baseline Pt reports compliance with initial HEP.    Time 4    Period Weeks    Status Achieved    Target Date 09/08/20      PT SHORT TERM GOAL #2   Title PT will review FOTO with patient by 3rd visit    Time 3    Period Weeks    Status Achieved    Target Date 09/01/20      PT SHORT TERM GOAL #3   Title Patient will report </= 1/10 resting pain level to reduce functional limitation    Baseline 09/04/2020: pt reports having pain when walking/standing but did not have back pain upon arrival and during session    Time 4    Period Weeks    Status Achieved    Target Date 09/08/20             PT Long Term Goals - 08/11/20 1355      PT LONG TERM GOAL #1   Title Patient will be I with final HEP to maintain progress from PT    Time 8    Period Weeks    Status New    Target Date 10/06/20      PT LONG TERM GOAL #2   Title Patient will report improved functional status to >/= Hip 78%  functional status, Lumbar 78% functional status% on FOTO    Time 8    Period Weeks    Status New    Target Date 10/06/20      PT LONG TERM GOAL #3   Title Patient will demonstrate  >/= 4+/5 MMT for hip and core strength to reduce pain and improve lifting ability    Time 8    Period Weeks    Status New    Target Date 10/06/20      PT LONG TERM GOAL #4   Title Patient will report no limitation with standing, walking, or heavy household tasks    Time 8    Period Weeks    Status New    Target Date 10/06/20      PT LONG TERM GOAL #5   Title Patient will demonstrate lumbar AROM WFL and non-painful to improve ability to stoop while working in yard without limitation    Time 8    Period Weeks    Status New    Target Date 10/06/20                 Plan - 09/04/20 1044    Clinical Impression Statement Pt tolerated session well with no complaints of low back pain during interventions. Pt expressed fatigue and minimal pain in R hip and groin while performing cat/camel. Pt continues to have a feeling of "tightness" in the back of her L knee but doesn't complain of TTP or other pain along knee joint line aside from occasional "pulling" in front of L knee.    Personal Factors and Comorbidities Time since onset of injury/illness/exacerbation;Fitness    Examination-Activity Limitations Locomotion Level;Lift;Bend    Examination-Participation Restrictions Cleaning;Yard Work;Community Activity;Shop    Stability/Clinical Decision Making Stable/Uncomplicated    Rehab Potential Good    PT Frequency 1x / week    PT Duration 8 weeks    PT Treatment/Interventions ADLs/Self Care Home Management;Cryotherapy;Electrical Stimulation;Iontophoresis 4mg /ml Dexamethasone;Moist Heat;Traction;Ultrasound;Neuromuscular re-education;Balance training;Therapeutic exercise;Therapeutic activities;Functional mobility training;Stair training;Gait training;Patient/family education;Dry needling;Passive range of motion;Manual techniques;Taping;Spinal Manipulations;Joint Manipulations    PT Next Visit Plan FOTO. Review updated HEP, consider TPDN if pt interested (R glute med and piriformis), reassess long  sit test if needed, progress core and BLE strengthening (CKC strength if tolerated - mini squats/wall slides, step ups, hip EXT, SLS, lateral stepping with band)    PT Home Exercise Plan [id number]: bridge with yellow, clamshell with yellow, cat camel, child's pose stretch, stretches (HS, piriformis, gastroc, Thomas), quadruped , HS curls with band, pallof press with rotation, posterior pelvic tilt, dead bugs, hip hinge with dowel    Consulted and Agree with Plan of Care Patient           Patient will benefit from skilled therapeutic intervention in order to improve the following deficits and impairments:  Decreased strength, Postural dysfunction, Improper body mechanics, Impaired flexibility, Decreased activity tolerance, Pain  Visit Diagnosis: Chronic bilateral low back pain, unspecified whether sciatica present  Pain in left hip  Pain in right hip  Muscle weakness (generalized)     Problem List There are no problems to display for this patient.    Academic librarian, PT, DPT 09/04/20 11:24 AM  [facility] [address] [place], [place], [place] Phone: [phone]   Fax:  [phone]  Name: Lisa Benjamin MRN: [place] Date of Birth: 07-04-1956

## 2020-09-26 ENCOUNTER — Other Ambulatory Visit: Payer: Self-pay

## 2020-09-26 ENCOUNTER — Ambulatory Visit: Payer: Medicare HMO | Attending: Family Medicine

## 2020-09-26 DIAGNOSIS — G8929 Other chronic pain: Secondary | ICD-10-CM | POA: Insufficient documentation

## 2020-09-26 DIAGNOSIS — M25551 Pain in right hip: Secondary | ICD-10-CM | POA: Diagnosis not present

## 2020-09-26 DIAGNOSIS — M6281 Muscle weakness (generalized): Secondary | ICD-10-CM | POA: Diagnosis not present

## 2020-09-26 DIAGNOSIS — M545 Low back pain, unspecified: Secondary | ICD-10-CM | POA: Insufficient documentation

## 2020-09-26 DIAGNOSIS — M25552 Pain in left hip: Secondary | ICD-10-CM | POA: Insufficient documentation

## 2020-09-26 NOTE — Therapy (Addendum)
[place], [place], [address] Phone: [phone]   Fax:  [phone]  Physical Therapy Treatment  Patient Details  Name: Lisa Benjamin MRN: [id number] Date of Birth: 1955/11/29 Referring Provider (PT): Lois Huxley, [place]   Encounter Date: 09/26/2020   PT End of Session - 09/26/20 1441    Visit Number 6    Number of Visits 8    Date for PT Re-Evaluation 10/06/20    Authorization Type [employer] MCR    Authorization Time Period FOTO by 10th visit    Progress Note Due on Visit 10    PT Start Time 1400    PT Stop Time 1441    PT Time Calculation (min) 41 min    Activity Tolerance Patient tolerated treatment well    Behavior During Therapy [facility] for tasks assessed/performed           Past Medical History:  Diagnosis Date  . Microscopic hematuria 11/04   negative work up with Dr. Risa Grill  . Osteopenia   . Vitamin D deficiency     Past Surgical History:  Procedure Laterality Date  . CESAREAN SECTION     x2  . ROTATOR CUFF REPAIR Left 12/2005  . TUBAL LIGATION Bilateral 1991    There were no vitals filed for this visit.   Subjective Assessment - 09/26/20 1406    Subjective "My knee has been really good. I am still having back pain and feel weakness in my groin and hips even walking and when I was climbing on/off a stool."    Pertinent History Previous work related back injury    Limitations Standing;Lifting;Walking;House hold activities    How long can you sit comfortably? No limitation    How long can you stand comfortably? "A couple hours"    How long can you walk comfortably? "A couple hours"    Diagnostic tests X-ray    Patient Stated Goals Pain relief    Currently in Pain? Yes    Pain Score 5     Pain Location Back    Pain Orientation Lower;Right    Pain Descriptors / Indicators Aching    Pain Type Chronic pain    Pain Onset More than a month ago    Pain Onset 1 to 4 weeks ago               [facility] PT Assessment - 09/26/20 0001      Assessment   Medical Diagnosis Low back pain    Referring Provider (PT) Lois Huxley, PA                         [facility] Adult PT Treatment/Exercise - 09/26/20 0001      Self-Care   Self-Care Other Self-Care Comments    Other Self-Care Comments  Skeleton model provided as visual demonstration for anatomy of lumbar spine, sacrum, innominate, and sciatic nerve. Reviewed and updated HEP to include R sciatic nerve glides      Lumbar Exercises: Aerobic   Recumbent Bike L6 x 5 min      Lumbar Exercises: Standing   Other Standing Lumbar Exercises Pallof press with rotation using 1 green band x 10 each direction; cues for core activation      Lumbar Exercises: Supine   Pelvic Tilt 20 reps    Clam 15 reps    Clam Limitations yellow band    Bridge with [employer] 20 reps  Straight Leg Raise 20 reps    Straight Leg Raises Limitations 1# ankle weight      Knee/Hip Exercises: Standing   Hip Abduction AROM;Stengthening;Both;20 reps;Knee straight    Abduction Limitations 1# ankle weight    Forward Step Up Both;20 reps;Step Height: 6"    Forward Step Up Limitations with opposite knee drive      Manual Therapy   Manual Therapy Soft tissue mobilization;Myofascial release;Muscle Energy Technique    Manual therapy comments R sciatic nerve glides x 10    Soft tissue mobilization STM, myofascial release, and roller along B glute med and piriformis (R > L)    Muscle Energy Technique Long sit test revealed posterior rotation of R innominate that was not apparent following MET (L hip EXT and R hip FL) 8 x 5 sec                  PT Education - 09/26/20 1752    Education Details Skeleton model provided as visual demonstration for anatomy of lumbar spine, sacrum, innominate, and sciatic nerve. Reviewed and updated HEP to include R sciatic nerve glides    Person(s) Educated Patient    Methods Explanation;Demonstration;Verbal  cues    Comprehension Verbalized understanding;Returned demonstration;Verbal cues required            PT Short Term Goals - 09/04/20 1111      PT SHORT TERM GOAL #1   Title Patient will be I with initial HEP to progress with PT    Baseline Pt reports compliance with initial HEP.    Time 4    Period Weeks    Status Achieved    Target Date 09/08/20      PT SHORT TERM GOAL #2   Title PT will review FOTO with patient by 3rd visit    Time 3    Period Weeks    Status Achieved    Target Date 09/01/20      PT SHORT TERM GOAL #3   Title Patient will report </= 1/10 resting pain level to reduce functional limitation    Baseline 09/04/2020: pt reports having pain when walking/standing but did not have back pain upon arrival and during session    Time 4    Period Weeks    Status Achieved    Target Date 09/08/20             PT Long Term Goals - 08/11/20 1355      PT LONG TERM GOAL #1   Title Patient will be I with final HEP to maintain progress from PT    Time 8    Period Weeks    Status New    Target Date 10/06/20      PT LONG TERM GOAL #2   Title Patient will report improved functional status to >/= Hip 78% functional status, Lumbar 78% functional status% on FOTO    Time 8    Period Weeks    Status New    Target Date 10/06/20      PT LONG TERM GOAL #3   Title Patient will demonstrate >/= 4+/5 MMT for hip and core strength to reduce pain and improve lifting ability    Time 8    Period Weeks    Status New    Target Date 10/06/20      PT LONG TERM GOAL #4   Title Patient will report no limitation with standing, walking, or heavy household tasks    Time 8  Period Weeks    Status New    Target Date 10/06/20      PT LONG TERM GOAL #5   Title Patient will demonstrate lumbar AROM WFL and non-painful to improve ability to stoop while working in yard without limitation    Time 8    Period Weeks    Status New    Target Date 10/06/20                 Plan  - 09/26/20 1441    Clinical Impression Statement Patient tolerated session well with no adverse effects or complaints of LBP or knee pain. She tolerated addition of 1# ankle weight for hip exercises well, and she was able to perform hooklying clamshells without significant discomfort in groin. Long sit test revealed posterior rotation of R innominate that was not apparent following MET (R hip FL and L hip EXT). Pt expressed interest in TPDN next session due to continued tightness and symptoms in R hip and groin. She should benefit from continued skilled PT intervention to decrease LBP and improve tolerance to activities.    Personal Factors and Comorbidities Time since onset of injury/illness/exacerbation;Fitness    Examination-Activity Limitations Locomotion Level;Lift;Bend    Examination-Participation Restrictions Cleaning;Yard Work;Community Activity;Shop    Stability/Clinical Decision Making Stable/Uncomplicated    Rehab Potential Good    PT Frequency 1x / week    PT Duration 8 weeks    PT Treatment/Interventions ADLs/Self Care Home Management;Cryotherapy;Electrical Stimulation;Iontophoresis 6m/ml Dexamethasone;Moist Heat;Traction;Ultrasound;Neuromuscular re-education;Balance training;Therapeutic exercise;Therapeutic activities;Functional mobility training;Stair training;Gait training;Patient/family education;Dry needling;Passive range of motion;Manual techniques;Taping;Spinal Manipulations;Joint Manipulations    PT Next Visit Plan FOTO and review progress. Review updated HEP, TPDN if pt still interested (R glute med and piriformis), reassess long sit test if needed, progress core and BLE strengthening (CKC strength if tolerated - mini squats/wall slides, step ups, hip EXT, SLS, lateral stepping with band)    PT Home Exercise Plan [id number]: bridge with yellow, clamshell with yellow, cat camel, child's pose stretch, stretches (HS, piriformis, gastroc, Thomas), quadruped fAmbulance person HS curls with  band, pallof press with rotation, posterior pelvic tilt, dead bugs, hip hinge with dowel, sciatic nerve glides    Consulted and Agree with Plan of Care Patient           Patient will benefit from skilled therapeutic intervention in order to improve the following deficits and impairments:  Decreased strength,Postural dysfunction,Improper body mechanics,Impaired flexibility,Decreased activity tolerance,Pain  Visit Diagnosis: Chronic bilateral low back pain, unspecified whether sciatica present  Pain in left hip  Pain in right hip  Muscle weakness (generalized)     Problem List There are no problems to display for this patient.    KHaydee Monica PT, DPT 09/26/20 8:54 PM  [facility] [address]Phone: [phone]  Fax:  [phone] Name: RShaneequa BahnerMRN: [id number] of Birth: [birth year]

## 2020-10-02 ENCOUNTER — Other Ambulatory Visit: Payer: Self-pay

## 2020-10-02 ENCOUNTER — Ambulatory Visit: Payer: Medicare HMO

## 2020-10-02 DIAGNOSIS — M25551 Pain in right hip: Secondary | ICD-10-CM

## 2020-10-02 DIAGNOSIS — M545 Low back pain, unspecified: Secondary | ICD-10-CM | POA: Diagnosis not present

## 2020-10-02 DIAGNOSIS — G8929 Other chronic pain: Secondary | ICD-10-CM

## 2020-10-02 DIAGNOSIS — M25552 Pain in left hip: Secondary | ICD-10-CM

## 2020-10-02 DIAGNOSIS — M6281 Muscle weakness (generalized): Secondary | ICD-10-CM | POA: Diagnosis not present

## 2020-10-02 NOTE — Therapy (Signed)
[place], [place], [address] Phone: [phone]   Fax:  [phone]  Physical Therapy Treatment  Patient Details  Name: Lisa Benjamin MRN: [id number] Date of Birth: 04/19/1956 Referring Provider (PT): Lois Huxley, [place]   Encounter Date: 10/02/2020   PT End of Session - 10/02/20 1358    Visit Number 7    Number of Visits 8    Date for PT Re-Evaluation 10/06/20    Authorization Type [employer] MCR    Authorization Time Period FOTO by 10th visit    Progress Note Due on Visit 10    PT Start Time 1400    PT Stop Time 1441    PT Time Calculation (min) 41 min    Activity Tolerance Patient tolerated treatment well    Behavior During Therapy [facility] for tasks assessed/performed           Past Medical History:  Diagnosis Date  . Microscopic hematuria 11/04   negative work up with Dr. Risa Grill  . Osteopenia   . Vitamin D deficiency     Past Surgical History:  Procedure Laterality Date  . CESAREAN SECTION     x2  . ROTATOR CUFF REPAIR Left 12/2005  . TUBAL LIGATION Bilateral 1991    There were no vitals filed for this visit.   Subjective Assessment - 10/02/20 1357    Subjective Patient reports continued pain and discomfort in R groin and back/hips with her groin pain limiting her the most recently. Pt denies having her symptoms upon arrival today.    Pertinent History Previous work related back injury    Limitations Standing;Lifting;Walking;House hold activities    How long can you sit comfortably? No limitation    How long can you stand comfortably? "A couple hours"    How long can you walk comfortably? "A couple hours"    Diagnostic tests X-ray    Patient Stated Goals Pain relief    Currently in Pain? No/denies    Pain Score 0-No pain    Pain Onset More than a month ago    Pain Onset 1 to 4 weeks ago              [facility] PT Assessment - 10/02/20 0001      Assessment   Medical Diagnosis Low  back pain    Referring Provider (PT) Lois Huxley, PA      Observation/Other Assessments   Focus on Therapeutic Outcomes (FOTO)  Hip: 91% functional status; Lumbar: 82% functional status                         [facility] Adult PT Treatment/Exercise - 10/02/20 0001      Lumbar Exercises: Aerobic   Recumbent Bike --      Lumbar Exercises: Supine   Clam --    Clam Limitations --      Knee/Hip Exercises: Stretches   Piriformis Stretch Both;2 reps;30 seconds    Other Knee/Hip Stretches Warrior pose stretch for R pectineus 2 x 30 sec    Other Knee/Hip Stretches Butterfly stretch 4 x 20 seconds      Knee/Hip Exercises: Standing   SLS on foam pad 4 x 10 sec each LE    Other Standing Knee Exercises Monster walks and lateral stepping with red theraband x 20 feet each      Manual Therapy   Manual Therapy Soft tissue mobilization    Manual therapy comments Skilled inspection,  palpation, and inspection during TPDN of R pectineus by [place].    Soft tissue mobilization STM and myofascial release along R pectineus    Muscle Energy Technique Long sit test revealed posterior rotation of R innominate that was not apparent following MET (L hip EXT and R hip FL) 8 x 5 sec. Demonstrated and had pt return demonstration of self-MET using dowel/broom stick            Trigger Point Dry Needling - 10/02/20 0001    Consent Given? Yes    Education Handout Provided Previously provided   verbal education provided   Muscles Treated Lower Quadrant --   R pectineus               PT Education - 10/02/20 1847    Education Details Reviewed HEP and expected response from TPDN. Reviewed FOTO and progress thus far. Education provided regarding centralization and peripheralization.    Person(s) Educated Patient    Methods Explanation;Demonstration;Verbal cues    Comprehension Verbalized understanding;Returned demonstration;Need further instruction;Verbal cues required             PT Short Term Goals - 09/04/20 1111      PT SHORT TERM GOAL #1   Title Patient will be I with initial HEP to progress with PT    Baseline Pt reports compliance with initial HEP.    Time 4    Period Weeks    Status Achieved    Target Date 09/08/20      PT SHORT TERM GOAL #2   Title PT will review FOTO with patient by 3rd visit    Time 3    Period Weeks    Status Achieved    Target Date 09/01/20      PT SHORT TERM GOAL #3   Title Patient will report </= 1/10 resting pain level to reduce functional limitation    Baseline 09/04/2020: pt reports having pain when walking/standing but did not have back pain upon arrival and during session    Time 4    Period Weeks    Status Achieved    Target Date 09/08/20             PT Long Term Goals - 10/02/20 1845      PT LONG TERM GOAL #1   Title Patient will be I with final HEP to maintain progress from PT    Time 8    Period Weeks    Status On-going      PT LONG TERM GOAL #2   Title Patient will report improved functional status to >/= Hip 78% functional status, Lumbar 78% functional status% on FOTO    Baseline Hip: 91% functional status; Lumbar: 82% functional status    Time 8    Period Weeks    Status Achieved      PT LONG TERM GOAL #3   Title Patient will demonstrate >/= 4+/5 MMT for hip and core strength to reduce pain and improve lifting ability    Time 8    Period Weeks    Status On-going      PT LONG TERM GOAL #4   Title Patient will report no limitation with standing, walking, or heavy household tasks    Time 8    Period Weeks    Status On-going      PT LONG TERM GOAL #5   Title Patient will demonstrate lumbar AROM WFL and non-painful to improve ability to stoop while working in yard without  limitation    Time 8    Period Weeks    Status On-going                 Plan - 10/02/20 1358    Clinical Impression Statement Patient tolerated session well with no adverse effects of complaints of  signficant increase in pain. Pt expressed some relief but soreness immediately after TPDN. She reports continued symptoms down LE but states she does have decreased numbness/tingling after several repetitions of sciatic nerve glides. Patients hip FOTO score improved from 72% to 91% functional status and lumbar FOTO score improved from 75% to 82% functional status from 08/11/2020 to 10/02/2020. She has progressed well and should continue to benefit from skilled PT intervention to address deficits and allow for return to desired activity level, such as being able to perform household tasks and yardwork.    Personal Factors and Comorbidities Time since onset of injury/illness/exacerbation;Fitness    Examination-Activity Limitations Locomotion Level;Lift;Bend    Examination-Participation Restrictions Cleaning;Yard Work;Community Activity;Shop    Stability/Clinical Decision Making Stable/Uncomplicated    Rehab Potential Good    PT Frequency 1x / week    PT Duration 8 weeks    PT Treatment/Interventions ADLs/Self Care Home Management;Cryotherapy;Electrical Stimulation;Iontophoresis 7m/ml Dexamethasone;Moist Heat;Traction;Ultrasound;Neuromuscular re-education;Balance training;Therapeutic exercise;Therapeutic activities;Functional mobility training;Stair training;Gait training;Patient/family education;Dry needling;Passive range of motion;Manual techniques;Taping;Spinal Manipulations;Joint Manipulations    PT Next Visit Plan Review updated HEP, assess response to TPDN of R pectineus, reassess long sit test if needed, progress core and BLE strengthening ([facility] strength if tolerated - mini squats/wall slides, step ups, hip EXT)    PT Home Exercise Plan [id number]: bridge with yellow, clamshell with yellow, cat camel, child's pose stretch, stretches (HS, piriformis, gastroc, Thomas), quadruped fAmbulance person HS curls with band, pallof press with rotation, posterior pelvic tilt, dead bugs, hip hinge with dowel, sciatic  nerve glides, butterfly stretch, warrior pFacilities manager monster walks/lateral stepping    Consulted and Agree with Plan of Care Patient           Patient will benefit from skilled therapeutic intervention in order to improve the following deficits and impairments:  Decreased strength,Postural dysfunction,Improper body mechanics,Impaired flexibility,Decreased activity tolerance,Pain  Visit Diagnosis: Chronic bilateral low back pain, unspecified whether sciatica present  Pain in left hip  Pain in right hip  Muscle weakness (generalized)     Problem List There are no problems to display for this patient.    KHaydee Monica PT, DPT 10/02/20 7:01 PM  [facility] [address]Phone: [phone]  Fax:  [phone] Name: Lisa GallardoMRN: [id number] of Birth: [birth year]

## 2020-10-09 ENCOUNTER — Ambulatory Visit: Payer: Medicare HMO

## 2020-10-09 ENCOUNTER — Other Ambulatory Visit: Payer: Self-pay

## 2020-10-09 DIAGNOSIS — G8929 Other chronic pain: Secondary | ICD-10-CM

## 2020-10-09 DIAGNOSIS — M25552 Pain in left hip: Secondary | ICD-10-CM | POA: Diagnosis not present

## 2020-10-09 DIAGNOSIS — M25551 Pain in right hip: Secondary | ICD-10-CM | POA: Diagnosis not present

## 2020-10-09 DIAGNOSIS — M6281 Muscle weakness (generalized): Secondary | ICD-10-CM | POA: Diagnosis not present

## 2020-10-09 DIAGNOSIS — M545 Low back pain, unspecified: Secondary | ICD-10-CM

## 2020-10-09 NOTE — Therapy (Addendum)
[place], [place], [address] Phone: [phone]   Fax:  [phone]  Physical Therapy Treatment/Re-evaluation/Discharge Summary  Patient Details  Name: Lisa Benjamin MRN: [id number] Date of Birth: November 02, 1955 Referring Provider (PT): Lois Huxley, [place]   Encounter Date: 10/09/2020   PT End of Session - 10/09/20 1045    Visit Number 8    Number of Visits 12    Date for PT Re-Evaluation 11/11/20    Authorization Type [employer] MCR    Authorization Time Period FOTO by 10th visit    Progress Note Due on Visit 10    PT Start Time 1045    PT Stop Time 1128    PT Time Calculation (min) 43 min    Activity Tolerance Patient tolerated treatment well    Behavior During Therapy [facility] for tasks assessed/performed           Past Medical History:  Diagnosis Date  . Microscopic hematuria 11/04   negative work up with Dr. Risa Grill  . Osteopenia   . Vitamin D deficiency     Past Surgical History:  Procedure Laterality Date  . CESAREAN SECTION     x2  . ROTATOR CUFF REPAIR Left 12/2005  . TUBAL LIGATION Bilateral 1991    There were no vitals filed for this visit.   Subjective Assessment - 10/09/20 1047    Subjective "The back is a little sore today and is probably the worst. I had a lot of pain Tuesday, Wednesday, and Thursday after the dry needling in my right groin but it wasn't as bad after that so I guess it took some time." Pt reports her radicular symptoms in RLE have been occurring more frequently over the past week but sciatic nerve glides continue to ease symptoms.    Pertinent History Previous work related back injury    Limitations Standing;Lifting;Walking;House hold activities    How long can you sit comfortably? No limitation    How long can you stand comfortably? "A couple hours"    How long can you walk comfortably? "A couple hours"    Diagnostic tests X-ray    Patient Stated Goals Pain relief     Currently in Pain? Yes    Pain Score 4     Pain Location Back    Pain Orientation Lower;Mid    Pain Descriptors / Indicators Sore    Pain Type Chronic pain    Pain Onset More than a month ago    Pain Onset 1 to 4 weeks ago              [facility] PT Assessment - 10/09/20 0001      Assessment   Medical Diagnosis Low back pain    Referring Provider (PT) Lois Huxley, PA      Observation/Other Assessments   Focus on Therapeutic Outcomes (FOTO)  10/02/2020: Hip: 91% functional status; Lumbar: 82% functional status      AROM   Lumbar Flexion WFL but soreness in mid lumbar spine    Lumbar Extension WFL but soreness mid lumbar spine to R paraspinals    Lumbar - Right Side Bend To inferior pole of patella    Lumbar - Left Side Bend To inferior pole of patella    Lumbar - Right Rotation WFL and soreness is not as bad    Lumbar - Left Rotation WFL and soreness is not as bad      Strength   Right Hip Flexion 5/5  Right Hip ABduction 5/5    Right Hip ADduction 5/5    Left Hip Flexion 5/5    Left Hip ABduction 5/5    Left Hip ADduction 5/5    Right Knee Flexion 5/5    Right Knee Extension 5/5    Left Knee Flexion 5/5    Left Knee Extension 5/5    Right Ankle Dorsiflexion 5/5    Right Ankle Plantar Flexion 5/5   modified test in sitting   Left Ankle Dorsiflexion 5/5    Left Ankle Plantar Flexion 5/5   modified test in sitting     Palpation   Palpation comment TTP along R PSIS, R lumbar paraspinals, R glute max, R piriformis, and R glute med                         [facility] Adult PT Treatment/Exercise - 10/09/20 0001      Self-Care   Self-Care Other Self-Care Comments    Other Self-Care Comments  Reviewed objective measurements today compared to previously recorded measurements. Discussed centralization vs peripheralization of RLE symptoms and advised to continue sciatic nerve glides as long as they continue to centralize symptoms. Discussed POC.      Lumbar  Exercises: Seated   Other Seated Lumbar Exercises Standing forward flexion stretch reaching to opposite foot 5 x 10 seconds each side    Other Seated Lumbar Exercises Lumbar forward and lateral FL with green physioball 5 x 5 sec in each direction      Lumbar Exercises: Supine   Bridge Limitations yellow band proximal to knees x 30      Lumbar Exercises: Sidelying   Clam Both;15 reps    Clam Limitations 2 x 15 with yellow theraband      Knee/Hip Exercises: Stretches   Piriformis Stretch Both;2 reps;30 seconds    Piriformis Stretch Limitations Seated at EOM      Knee/Hip Exercises: Standing   Hip Abduction AROM;Stengthening;Both;Knee straight;2 sets;15 reps    Abduction Limitations 2# ankle weight    Hip Extension AROM;Stengthening;Both;2 sets;15 reps;Knee straight    Extension Limitations 2# ankle weight    Forward Step Up Both;15 reps;Step Height: 6"    Forward Step Up Limitations with opposite knee drive      Manual Therapy   Manual Therapy Joint mobilization;Soft tissue mobilization;Myofascial release;Muscle Energy Technique    Joint Mobilization Gentle PA joint mobilizations along lumbar spine and lumbosacral region    Soft tissue mobilization STM and myofascial release along R lumbar paraspinals, piriformis, glute med, and glute max. Roller along R glute max, glute med, and piriformis    Muscle Energy Technique Long sit test revealed posterior rotation of R innominate that was not apparent following MET (L hip EXT and R hip FL) 8 x 5 sec.                  PT Education - 10/09/20 1243    Education Details Reviewed objective measurements today compared to previously recorded measurements. Discussed centralization vs peripheralization of RLE symptoms and advised to continue sciatic nerve glides as long as they continue to centralize symptoms. Discussed POC.    Person(s) Educated Patient    Methods Explanation;Demonstration;Verbal cues    Comprehension Verbalized  understanding;Returned demonstration;Verbal cues required;Need further instruction            PT Short Term Goals - 09/04/20 1111      PT SHORT TERM GOAL #1   Title Patient will  be I with initial HEP to progress with PT    Baseline Pt reports compliance with initial HEP.    Time 4    Period Weeks    Status Achieved    Target Date 09/08/20      PT SHORT TERM GOAL #2   Title PT will review FOTO with patient by 3rd visit    Time 3    Period Weeks    Status Achieved    Target Date 09/01/20      PT SHORT TERM GOAL #3   Title Patient will report </= 1/10 resting pain level to reduce functional limitation    Baseline 09/04/2020: pt reports having pain when walking/standing but did not have back pain upon arrival and during session    Time 4    Period Weeks    Status Achieved    Target Date 09/08/20             PT Long Term Goals - 10/09/20 1121      PT LONG TERM GOAL #1   Title Patient will be I with final HEP to maintain progress from PT    Time 8    Period Weeks    Status On-going      PT LONG TERM GOAL #2   Title Patient will report improved functional status to >/= Hip 78% functional status, Lumbar 78% functional status% on FOTO    Baseline Hip: 91% functional status; Lumbar: 82% functional status    Time 8    Period Weeks    Status Achieved      PT LONG TERM GOAL #3   Title Patient will demonstrate >/= 4+/5 MMT for hip and core strength to reduce pain and improve lifting ability    Time 8    Period Weeks    Status Achieved      PT LONG TERM GOAL #4   Title Patient will report no limitation with standing, walking, or heavy household tasks    Time 8    Period Weeks    Status On-going      PT LONG TERM GOAL #5   Title Patient will demonstrate lumbar AROM WFL and non-painful to improve ability to stoop while working in yard without limitation    Time 8    Period Weeks    Status On-going      Additional Long Term Goals   Additional Long Term Goals Yes       PT LONG TERM GOAL #6   Title Patient will report no radicular symptoms in RLE.    Baseline Pt reports intermittent radicular symptoms in RLE    Time 4    Period Weeks    Status New    Target Date 11/06/20                 Plan - 10/09/20 1245    Clinical Impression Statement Patient tolerated treatment session well with no adverse effects or complaints of significant pain. She presents with TTP along R lumbar paraspinals, glute med, glute max, and piriformis that was slightly eased with manual techniques (STM, myofascial release, and roller) and stretching. Pt has made progress with skilled PT thus far but continues to have soreness along mid lumbar spine and paraspinals with AROM and minimal pain during daily activities. She states her pain does not limit her from performing activities but she "pushes through it". Pt has continued intermittent radicular symptoms in RLE that ease with sciatic nerve glides. She should benefit  from continued skilled PT intervention to address deficits and allow for lumbar AROM and daily activities with little to no pain.    Personal Factors and Comorbidities Time since onset of injury/illness/exacerbation;Fitness    Examination-Activity Limitations Locomotion Level;Lift;Bend    Examination-Participation Restrictions Cleaning;Yard Work;Community Activity;Shop    Stability/Clinical Decision Making Stable/Uncomplicated    Rehab Potential Good    PT Frequency 1x / week    PT Duration 8 weeks    PT Treatment/Interventions ADLs/Self Care Home Management;Cryotherapy;Electrical Stimulation;Iontophoresis 69m/ml Dexamethasone;Moist Heat;Traction;Ultrasound;Neuromuscular re-education;Balance training;Therapeutic exercise;Therapeutic activities;Functional mobility training;Stair training;Gait training;Patient/family education;Dry needling;Passive range of motion;Manual techniques;Taping;Spinal Manipulations;Joint Manipulations    PT Next Visit Plan Review updated  HEP, reassess long sit test if needed, progress core and BLE strengthening, lumbar paraspinal and R piriformis stretch, SKTC    PT Home Exercise Plan [id number]: bridge with yellow, clamshell with yellow, cat camel, child's pose stretch, stretches (HS, piriformis, gastroc, Thomas), quadruped fAmbulance person HS curls with band, pallof press with rotation, posterior pelvic tilt, dead bugs, hip hinge with dowel, sciatic nerve glides, butterfly stretch, warrior pFacilities manager monster walks/lateral stepping    Consulted and Agree with Plan of Care Patient           Patient will benefit from skilled therapeutic intervention in order to improve the following deficits and impairments:  Decreased strength,Postural dysfunction,Improper body mechanics,Impaired flexibility,Decreased activity tolerance,Pain  Visit Diagnosis: Chronic bilateral low back pain, unspecified whether sciatica present  Pain in left hip  Pain in right hip  Muscle weakness (generalized)     Problem List There are no problems to display for this patient.   PHYSICAL THERAPY DISCHARGE SUMMARY  Visits from Start of Care: 8  Current functional level related to goals / functional outcomes: See above   Remaining deficits: See above   Education / Equipment: See above  Plan: Patient agrees to discharge.  Patient goals were partially met. Patient is being discharged due to not returning since the last visit.  ?????         KHaydee Monica PT, DPT 12/04/20 5:46 PM  [facility] [address]Phone: [phone]  Fax:  [phone] Name: RKhamille BeynonMRN: [id number] of Birth: 1Jul 08, 1957

## 2020-10-21 DIAGNOSIS — Z6832 Body mass index (BMI) 32.0-32.9, adult: Secondary | ICD-10-CM | POA: Diagnosis not present

## 2020-10-21 DIAGNOSIS — U071 COVID-19: Secondary | ICD-10-CM | POA: Diagnosis not present

## 2020-10-28 DIAGNOSIS — Z8616 Personal history of COVID-19: Secondary | ICD-10-CM | POA: Diagnosis not present

## 2020-10-28 DIAGNOSIS — Z6831 Body mass index (BMI) 31.0-31.9, adult: Secondary | ICD-10-CM | POA: Diagnosis not present

## 2020-10-28 DIAGNOSIS — Z20822 Contact with and (suspected) exposure to covid-19: Secondary | ICD-10-CM | POA: Diagnosis not present

## 2020-11-08 ENCOUNTER — Other Ambulatory Visit: Payer: Self-pay

## 2020-11-08 ENCOUNTER — Encounter: Payer: Self-pay | Admitting: Emergency Medicine

## 2020-11-08 ENCOUNTER — Ambulatory Visit
Admission: EM | Admit: 2020-11-08 | Discharge: 2020-11-08 | Disposition: A | Discharge status: Home / Self Care | Payer: Medicare HMO | Attending: Urgent Care | Admitting: Urgent Care

## 2020-11-08 DIAGNOSIS — J4530 Mild persistent asthma, uncomplicated: Secondary | ICD-10-CM

## 2020-11-08 DIAGNOSIS — R062 Wheezing: Secondary | ICD-10-CM

## 2020-11-08 DIAGNOSIS — R059 Cough, unspecified: Secondary | ICD-10-CM

## 2020-11-08 DIAGNOSIS — Z8616 Personal history of COVID-19: Secondary | ICD-10-CM

## 2020-11-08 MED ORDER — PROMETHAZINE-DM 6.25-15 MG/5ML PO SYRP: 5.0000 mL | ORAL_SOLUTION | Freq: Every evening | ORAL | 0 refills | Status: DC | PRN

## 2020-11-08 MED ORDER — ALBUTEROL SULFATE HFA 108 (90 BASE) MCG/ACT IN AERS: 1.0000 | INHALATION_SPRAY | Freq: Four times a day (QID) | RESPIRATORY_TRACT | 0 refills | Status: DC | PRN

## 2020-11-08 MED ORDER — PREDNISONE 20 MG PO TABS: ORAL_TABLET | ORAL | 0 refills | Status: DC

## 2020-11-08 MED ORDER — BENZONATATE 100 MG PO CAPS: 100.0000 mg | ORAL_CAPSULE | Freq: Three times a day (TID) | ORAL | 0 refills | Status: DC | PRN

## 2020-11-08 NOTE — ED Triage Notes (Signed)
Pt here with cough and some exertional SOB after testing positive for covid on 1/15; pt sts is now using her inhaler

## 2020-11-08 NOTE — ED Provider Notes (Signed)
[facility]   MRN: [id number] DOB: [birth year]  Subjective:   Lisa Benjamin is a 65 y.o. female presenting for persistent cough, wheezing and exertional shortness of breath.  She has a history of asthma and has needed to start using her inhaler again.  She also has used Symbicort in the past and restarted this.  She did test positive for COVID-19 this past month.  She tested again on 15 January and was negative.  Denies fever, chest pain.  She is not a smoker.  She does have a cardiologist that she follows regularly.  Keeps a close eye on her blood pressure and reports that it is normal at home when she checks it.  No current facility-administered medications for this encounter.  Current Outpatient Medications:  .  benzonatate (TESSALON) 100 MG capsule, Take 1 capsule (100 mg total) by mouth every 8 (eight) hours. (Patient not taking: Reported on 08/11/2020), Disp: 21 capsule, Rfl: 0 .  calcium carbonate (TUMS - DOSED IN MG ELEMENTAL CALCIUM) 500 MG chewable tablet, Chew 2 tablets by mouth daily. , Disp: , Rfl:  .  montelukast (SINGULAIR) 10 MG tablet, Take 1 tablet (10 mg total) by mouth at bedtime., Disp: 30 tablet, Rfl: 0 .  Multiple Vitamin (MULTIVITAMIN) tablet, Take 1 tablet by mouth daily., Disp: , Rfl:  .  Polyethyl Glycol-Propyl Glycol (LUBRICANT EYE DROPS) 0.4-0.3 % SOLN, Apply 1 drop to eye 3 (three) times daily as needed., Disp: 10 mL, Rfl: 0 .  predniSONE (DELTASONE) 50 MG tablet, Take 1 tablet (50 mg total) by mouth daily with breakfast. (Patient not taking: Reported on 08/11/2020), Disp: 5 tablet, Rfl: 0 .  VITAMIN D PO, Take 2,000 Units by mouth daily. , Disp: , Rfl:    No Known Allergies  Past Medical History:  Diagnosis Date  . Microscopic hematuria 11/04   negative work up with Dr. Isabel Caprice  . Osteopenia   . Vitamin D deficiency      Past Surgical History:  Procedure Laterality Date  . CESAREAN SECTION     x2  . ROTATOR CUFF REPAIR Left 12/2005  .  TUBAL LIGATION Bilateral 1991    Family History  Problem Relation Age of Onset  . Breast cancer Mother 72  . Diabetes Mother   . Colon cancer Mother   . Heart failure Father        CHF  . COPD Father   . Diabetes Brother   . Hypertension Sister        maybe some BP issues  . Diabetes Sister   . Heart disease Brother 48       angioplasty  . Colon cancer Maternal Grandmother     Social History   Tobacco Use  . Smoking status: Never Smoker  . Smokeless tobacco: Never Used  Vaping Use  . Vaping Use: Never used  Substance Use Topics  . Alcohol use: No  . Drug use: No    ROS   Objective:   Vitals: BP (!) 165/79 (BP Location: Right Arm)   Pulse 90   Temp 98.2 F (36.8 C) (Oral)   Resp 18   LMP 06/14/2010 (Approximate)   SpO2 98%   BP Readings from Last 3 Encounters:  11/08/20 (!) 165/79  06/09/20 (!) 164/69  02/20/20 (!) 157/80   Physical Exam Constitutional:      General: She is not in acute distress.    Appearance: Normal appearance. She is well-developed. She is not ill-appearing, toxic-appearing or diaphoretic.  HENT:  Head: Normocephalic and atraumatic.     Nose: Nose normal.     Mouth/Throat:     Mouth: Mucous membranes are moist.  Eyes:     General: No scleral icterus.       Right eye: No discharge.        Left eye: No discharge.     Extraocular Movements: Extraocular movements intact.     Conjunctiva/sclera: Conjunctivae normal.     Pupils: Pupils are equal, round, and reactive to light.  Cardiovascular:     Rate and Rhythm: Normal rate and regular rhythm.     Pulses: Normal pulses.     Heart sounds: Normal heart sounds. No murmur heard. No friction rub. No gallop.   Pulmonary:     Effort: Pulmonary effort is normal. No respiratory distress.     Breath sounds: No stridor. Wheezing present. No rhonchi or rales.  Skin:    General: Skin is warm and dry.     Findings: No rash.  Neurological:     Mental Status: She is alert and oriented to  person, place, and time.  Psychiatric:        Mood and Affect: Mood normal.        Behavior: Behavior normal.        Thought Content: Thought content normal.        Judgment: Judgment normal.      Assessment and Plan :   PDMP not reviewed this encounter.  1. Wheezing   2. Cough   3. Mild persistent asthma without complication   4. History of COVID-19     Will use prednisone for her asthma.  Recommended cough suppression medications otherwise, schedule albuterol inhaler.  Deferred imaging. Counseled patient on potential for adverse effects with medications prescribed/recommended today, ER and return-to-clinic precautions discussed, patient verbalized understanding.    Wallis Bamberg, [place] 11/08/20 1631

## 2020-11-18 DIAGNOSIS — R059 Cough, unspecified: Secondary | ICD-10-CM | POA: Diagnosis not present

## 2020-11-18 DIAGNOSIS — T59811A Toxic effect of smoke, accidental (unintentional), initial encounter: Secondary | ICD-10-CM | POA: Diagnosis not present

## 2020-11-18 DIAGNOSIS — U071 COVID-19: Secondary | ICD-10-CM | POA: Diagnosis not present

## 2020-11-20 ENCOUNTER — Ambulatory Visit: Payer: Medicare HMO | Admitting: Certified Nurse Midwife

## 2020-12-12 ENCOUNTER — Other Ambulatory Visit: Payer: Self-pay | Admitting: Physician Assistant

## 2020-12-12 ENCOUNTER — Ambulatory Visit
Admission: RE | Admit: 2020-12-12 | Discharge: 2020-12-12 | Disposition: A | Discharge status: Home / Self Care | Payer: Medicare HMO | Source: Ambulatory Visit | Attending: Physician Assistant | Admitting: Physician Assistant

## 2020-12-12 DIAGNOSIS — R062 Wheezing: Secondary | ICD-10-CM | POA: Diagnosis not present

## 2020-12-12 DIAGNOSIS — J45901 Unspecified asthma with (acute) exacerbation: Secondary | ICD-10-CM | POA: Diagnosis not present

## 2021-01-02 ENCOUNTER — Ambulatory Visit: Payer: Medicare HMO | Admitting: Pulmonary Disease

## 2021-01-02 ENCOUNTER — Other Ambulatory Visit: Payer: Self-pay

## 2021-01-02 ENCOUNTER — Encounter: Payer: Self-pay | Admitting: Pulmonary Disease

## 2021-01-02 VITALS — BP 126/72 | HR 65 | Temp 97.3°F | Ht 62.0 in | Wt 179.8 lb

## 2021-01-02 DIAGNOSIS — Z8616 Personal history of COVID-19: Secondary | ICD-10-CM | POA: Diagnosis not present

## 2021-01-02 DIAGNOSIS — R0602 Shortness of breath: Secondary | ICD-10-CM

## 2021-01-02 DIAGNOSIS — J4540 Moderate persistent asthma, uncomplicated: Secondary | ICD-10-CM

## 2021-01-02 MED ORDER — AEROCHAMBER MV MISC: 0 refills | Status: AC

## 2021-01-02 NOTE — Patient Instructions (Addendum)
Continue using symbicort 2 puffs twice daily - Use spacer - Rinse mouth out after each use  Continue as needed albuterol inhaler 1-2 puffs every 4-6 hours as needed.   Please call if you are needing albuterol 2-3 times per day on a frequent basis.   We will determine the need for pulmonary function testing at the next visit in 3 months

## 2021-01-02 NOTE — Progress Notes (Signed)
Synopsis: Referred in March 2022 by Roslynn Amble, PA for asthma  Subjective:   PATIENT ID: Lisa Benjamin GENDER: female DOB: 10/10/1956, MRN: [id number]   HPI  Chief Complaint  Patient presents with  . Consult    Referred by PCP for asthma. Was diagnosed with asthma as a child but grew out of it until college. Was diagnosed with COVID back in January 2022. Has been on multiple rounds of prednisone. Stated the prednisone worked some.     Lisa Benjamin is a 65 year old woman, never smoker who is referred to pulmonary clinic for evaluation of asthma.   She reports having history of asthma in her childhood which improved after she was 16. She reports a couple episodes over recent years where she required steroids and antibiotics for bouts of bronchitis.   She was ill with covid 19 in early January and since then has been having issues with wheezing, cough and exertional dyspnea. She has been treated with 3 rounds of steroids, the most recent being earlier this month. She reports feeling better after this third round. She is using symbicort 160-4.50mcg 2 puffs twice daily and as needed albuterol. She continues to experience exeritonal dyspnea with walking. She was planning to participate in a running race this July and was hoping to start training for that soon.   She denies lower extremity edema, redness or pain. She denies chest pain or discomfort.  Past Medical History:  Diagnosis Date  . Microscopic hematuria 11/04   negative work up with Dr. Isabel Caprice  . Osteopenia   . Vitamin D deficiency      Family History  Problem Relation Age of Onset  . Breast cancer Mother 34  . Diabetes Mother   . Colon cancer Mother   . Heart failure Father        CHF  . COPD Father   . Diabetes Brother   . Hypertension Sister        maybe some BP issues  . Diabetes Sister   . Heart disease Brother 48       angioplasty  . Colon cancer Maternal Grandmother      Social History   Socioeconomic  History  . Marital status: Married    Spouse name: Not on file  . Number of children: 2  . Years of education: Not on file  . Highest education level: Not on file  Occupational History    Employer: [employer]  Tobacco Use  . Smoking status: Never Smoker  . Smokeless tobacco: Never Used  Vaping Use  . Vaping Use: Never used  Substance and Sexual Activity  . Alcohol use: No  . Drug use: No  . Sexual activity: Yes    Partners: Male    Birth control/protection: Post-menopausal  Other Topics Concern  . Not on file  Social History Narrative  . Not on file   Social Determinants of Health   Financial Resource Strain: Not on file  Food Insecurity: Not on file  Transportation Needs: Not on file  Physical Activity: Not on file  Stress: Not on file  Social Connections: Not on file  Intimate Partner Violence: Not on file     No Known Allergies   Outpatient Medications Prior to Visit  Medication Sig Dispense Refill  . albuterol (VENTOLIN HFA) 108 (90 Base) MCG/ACT inhaler Inhale 1-2 puffs into the lungs every 6 (six) hours as needed for wheezing or shortness of breath. 18 g 0  . budesonide-formoterol (SYMBICORT)  160-4.5 MCG/ACT inhaler Inhale 2 puffs into the lungs 2 (two) times daily.    . calcium carbonate (TUMS - DOSED IN MG ELEMENTAL CALCIUM) 500 MG chewable tablet Chew 2 tablets by mouth daily.     . Multiple Vitamin (MULTIVITAMIN) tablet Take 1 tablet by mouth daily.    Marland Kitchen VITAMIN D PO Take 2,000 Units by mouth daily.     . benzonatate (TESSALON) 100 MG capsule Take 1-2 capsules (100-200 mg total) by mouth 3 (three) times daily as needed for cough. 60 capsule 0  . montelukast (SINGULAIR) 10 MG tablet Take 1 tablet (10 mg total) by mouth at bedtime. 30 tablet 0  . Polyethyl Glycol-Propyl Glycol (LUBRICANT EYE DROPS) 0.4-0.3 % SOLN Apply 1 drop to eye 3 (three) times daily as needed. 10 mL 0  . predniSONE (DELTASONE) 20 MG tablet Take 2 tablets daily with  breakfast. 10 tablet 0  . promethazine-dextromethorphan (PROMETHAZINE-DM) 6.25-15 MG/5ML syrup Take 5 mLs by mouth at bedtime as needed for cough. 100 mL 0   No facility-administered medications prior to visit.    Review of Systems  Constitutional: Negative for chills, fever, malaise/fatigue and weight loss.  HENT: Negative for congestion, sinus pain and sore throat.   Eyes: Negative.   Respiratory: Positive for cough and shortness of breath. Negative for hemoptysis, sputum production and wheezing.   Cardiovascular: Negative for chest pain, palpitations, orthopnea, claudication and leg swelling.  Gastrointestinal: Negative for abdominal pain, heartburn, nausea and vomiting.  Genitourinary: Negative.   Musculoskeletal: Negative for joint pain and myalgias.  Skin: Negative for rash.  Neurological: Negative for weakness.  Endo/Heme/Allergies: Negative.   Psychiatric/Behavioral: Negative.     Objective:   Vitals:   01/02/21 1002  BP: 126/72  Pulse: 65  Temp: (!) 97.3 F (36.3 C)  TempSrc: Temporal  SpO2: 100%  Weight: 179 lb 12.8 oz (81.6 kg)  Height: 5\' 2"  (1.575 m)     Physical Exam Constitutional:      General: She is not in acute distress.    Appearance: She is not ill-appearing.  HENT:     Head: Normocephalic and atraumatic.  Eyes:     General: No scleral icterus.    Conjunctiva/sclera: Conjunctivae normal.     Pupils: Pupils are equal, round, and reactive to light.  Cardiovascular:     Rate and Rhythm: Normal rate and regular rhythm.     Pulses: Normal pulses.     Heart sounds: Normal heart sounds. No murmur heard.   Pulmonary:     Effort: Pulmonary effort is normal.     Breath sounds: Normal breath sounds. Decreased air movement present. No wheezing, rhonchi or rales.  Musculoskeletal:     Right lower leg: No edema.     Left lower leg: No edema.  Lymphadenopathy:     Cervical: No cervical adenopathy.  Skin:    General: Skin is warm and dry.   Neurological:     General: No focal deficit present.     Mental Status: She is alert.  Psychiatric:        Mood and Affect: Mood normal.        Behavior: Behavior normal.        Thought Content: Thought content normal.        Judgment: Judgment normal.    CBC    Component Value Date/Time   WBC 5.6 01/18/2020 0948   RBC 4.23 01/18/2020 0948   HGB 12.8 01/18/2020 0948   HGB 12.0 09/27/2013 1346  HCT 39.1 01/18/2020 0948   PLT 327 01/18/2020 0948   MCV 92.4 01/18/2020 0948   MCH 30.3 01/18/2020 0948   MCHC 32.7 01/18/2020 0948   RDW 13.9 01/18/2020 0948   BMP Latest Ref Rng & Units 01/18/2020  Glucose 70 - 99 mg/dL 481(E)  BUN 8 - 23 mg/dL 17  Creatinine 5.63 - 1.49 mg/dL 7.02(O)  Sodium 378 - 588 mmol/L 141  Potassium 3.5 - 5.1 mmol/L 3.9  Chloride 98 - 111 mmol/L 106  CO2 22 - 32 mmol/L 26  Calcium 8.9 - 10.3 mg/dL 9.4   Chest imaging: CXR 12/12/20 The cardiomediastinal contours are normal. The lungs are clear. Pulmonary vasculature is normal. No consolidation, pleural effusion, or pneumothorax. No acute osseous abnormalities are seen.  PFT: No flowsheet data found.  Echo 02/08/20: 1. Left ventricular ejection fraction, by estimation, is 60 to 65%. The  left ventricle has normal function. The left ventricle has no regional  wall motion abnormalities. There is mild left ventricular hypertrophy.  Left ventricular diastolic parameters  were normal.  2. Right ventricular systolic function is normal. The right ventricular  size is normal. There is normal pulmonary artery systolic pressure.  3. The mitral valve is grossly normal. Trivial mitral valve  regurgitation.  4. The aortic valve is tricuspid. Aortic valve regurgitation is not  visualized.  5. The inferior vena cava is normal in size with greater than 50%  respiratory variability, suggesting right atrial pressure of 3 mmHg.  24Hr Holter Monitor 12/11/17  Normal sinus rhythm with normal HR range  Rare  isolated PACs and PVCs  No junctional rhythm seen  Assessment & Plan:   Moderate persistent asthma without complication  Shortness of breath  History of COVID-19  Discussion: Lisa Benjamin is a 65 year old woman, never smoker who is referred to pulmonary clinic for evaluation of asthma.   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 Given her need for symbicort 160-4.50mcg 2 puffs twice daily and as needed albuterol, she has moderate persistent asthma at this time.   We discussed that her symptoms should continue to improve with time from her infection and that if she develops a frequent need for albuterol that we can add on LAMA therapy for her. Encouraged her to increase physical activity as tolerated.  We will have her back in 3 months and determine need for further testing (PFTs or CT chest scan) at that time.  Lisa Comas, MD [facility] Office: [phone]    Current Outpatient Medications:  .  albuterol (VENTOLIN HFA) 108 (90 Base) MCG/ACT inhaler, Inhale 1-2 puffs into the lungs every 6 (six) hours as needed for wheezing or shortness of breath., Disp: 18 g, Rfl: 0 .  budesonide-formoterol (SYMBICORT) 160-4.5 MCG/ACT inhaler, Inhale 2 puffs into the lungs 2 (two) times daily., Disp: , Rfl:  .  calcium carbonate (TUMS - DOSED IN MG ELEMENTAL CALCIUM) 500 MG chewable tablet, Chew 2 tablets by mouth daily. , Disp: , Rfl:  .  Multiple Vitamin (MULTIVITAMIN) tablet, Take 1 tablet by mouth daily., Disp: , Rfl:  .  Spacer/Aero-Holding Chambers (AEROCHAMBER MV) inhaler, Use as instructed, Disp: 1 each, Rfl: 0 .  VITAMIN D PO, Take 2,000 Units by mouth daily. , Disp: , Rfl:

## 2021-01-24 IMAGING — CR DG LUMBAR SPINE COMPLETE 4+V
5 series · 5 of 5 positions shown · non-contrast
Comparison: None.

CLINICAL DATA: Chronic low back and bilateral hip pain without
known injury.

EXAM:
LUMBAR SPINE - COMPLETE 4+ VIEW

[t lumbar spine ap]
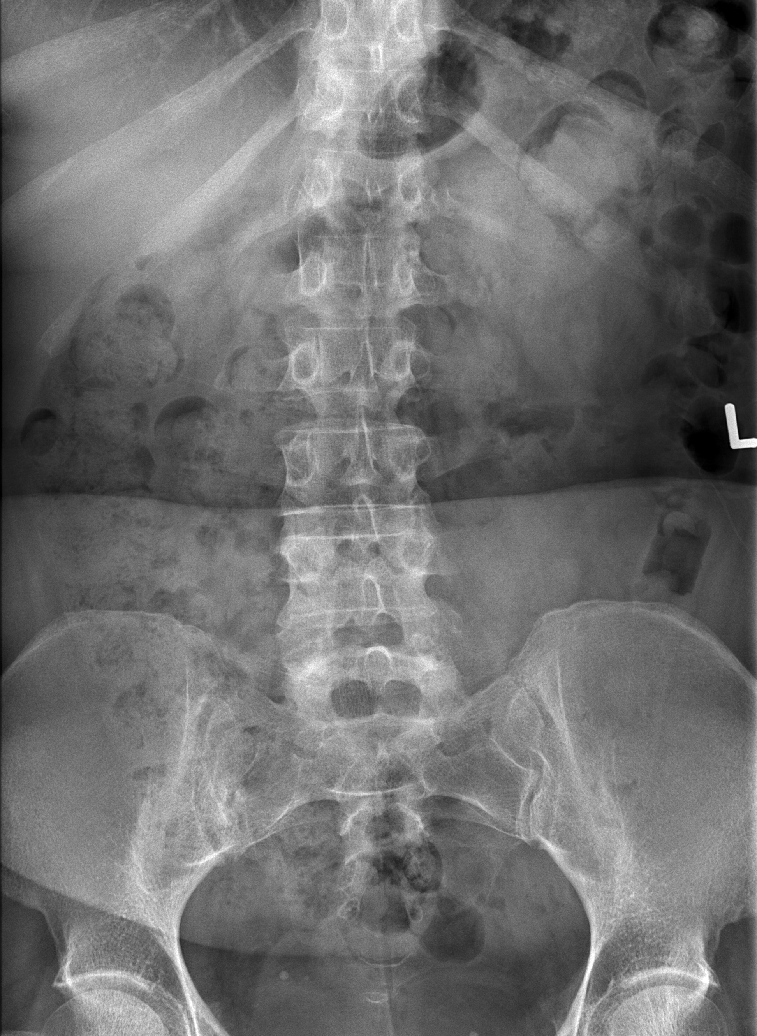

[t lumbar spine obl (1 of 2)]
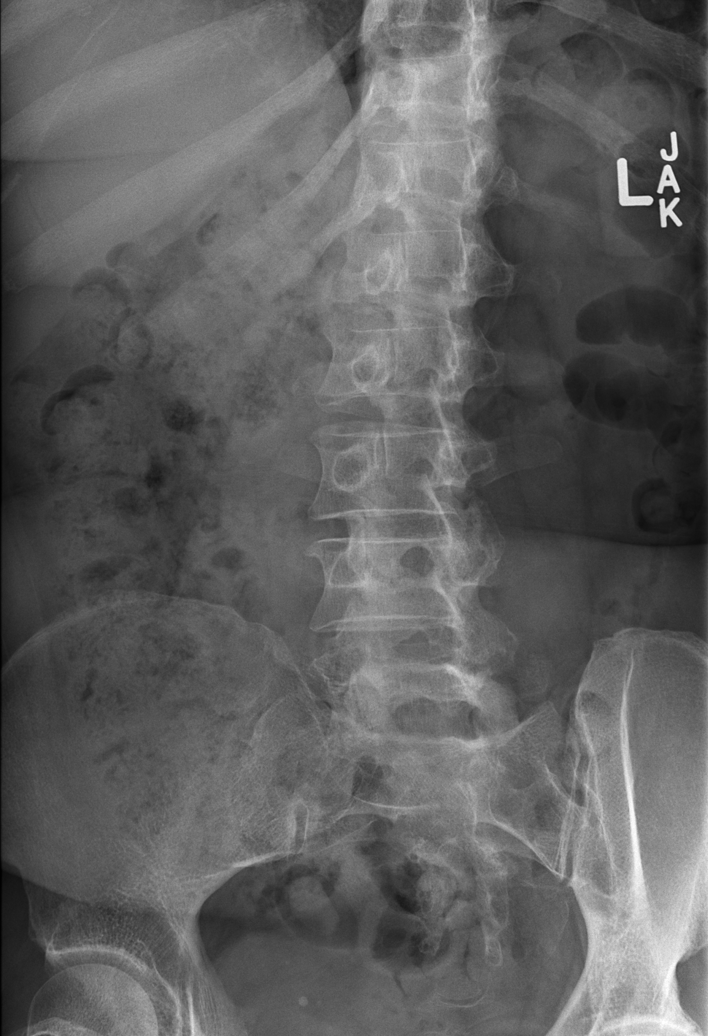

[t lumbar spine obl (2 of 2)]
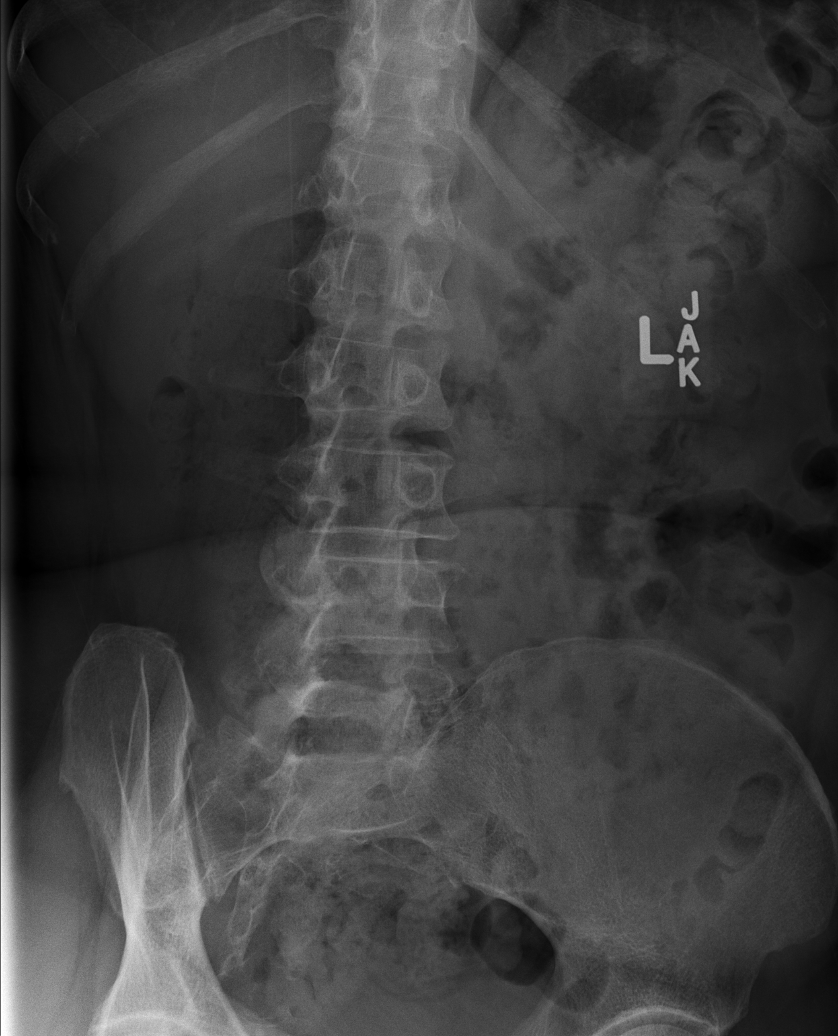

[t lumbar spine lat]
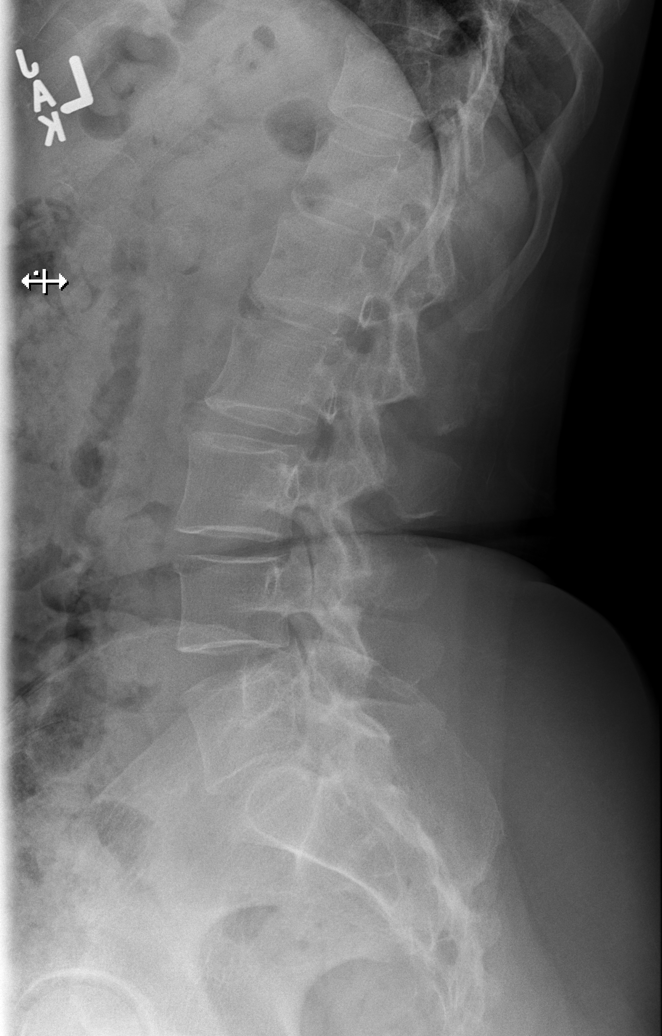

[t lumbar l-5 s-1 spot]
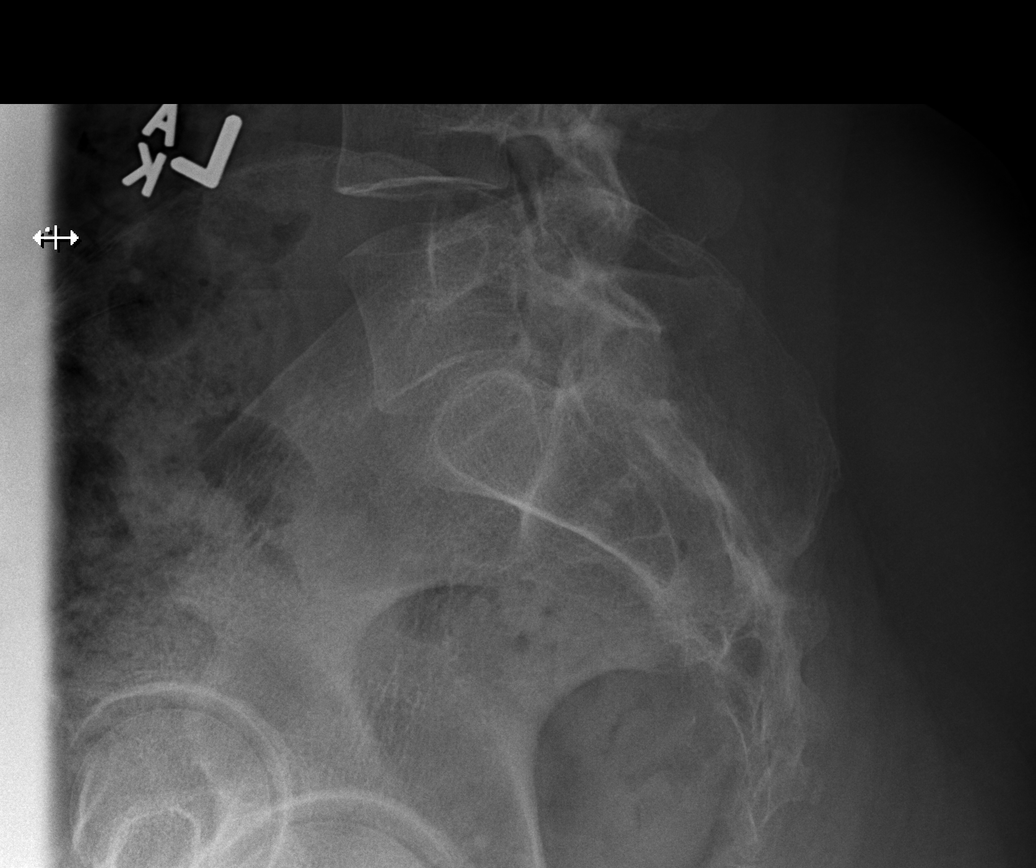

[5 of 5 positions shown; findings below may reference images not displayed]

FINDINGS: There is no evidence of lumbar spine fracture. Alignment is normal.
Intervertebral disc spaces are maintained.
IMPRESSION: Negative.

## 2021-01-24 IMAGING — CR DG PELVIS 1-2V
1 series · 1 of 1 positions shown · non-contrast
Comparison: None.

CLINICAL DATA: Chronic bilateral hip pain without known injury.

EXAM:
PELVIS - 1-2 VIEW

[t pelvis ap]
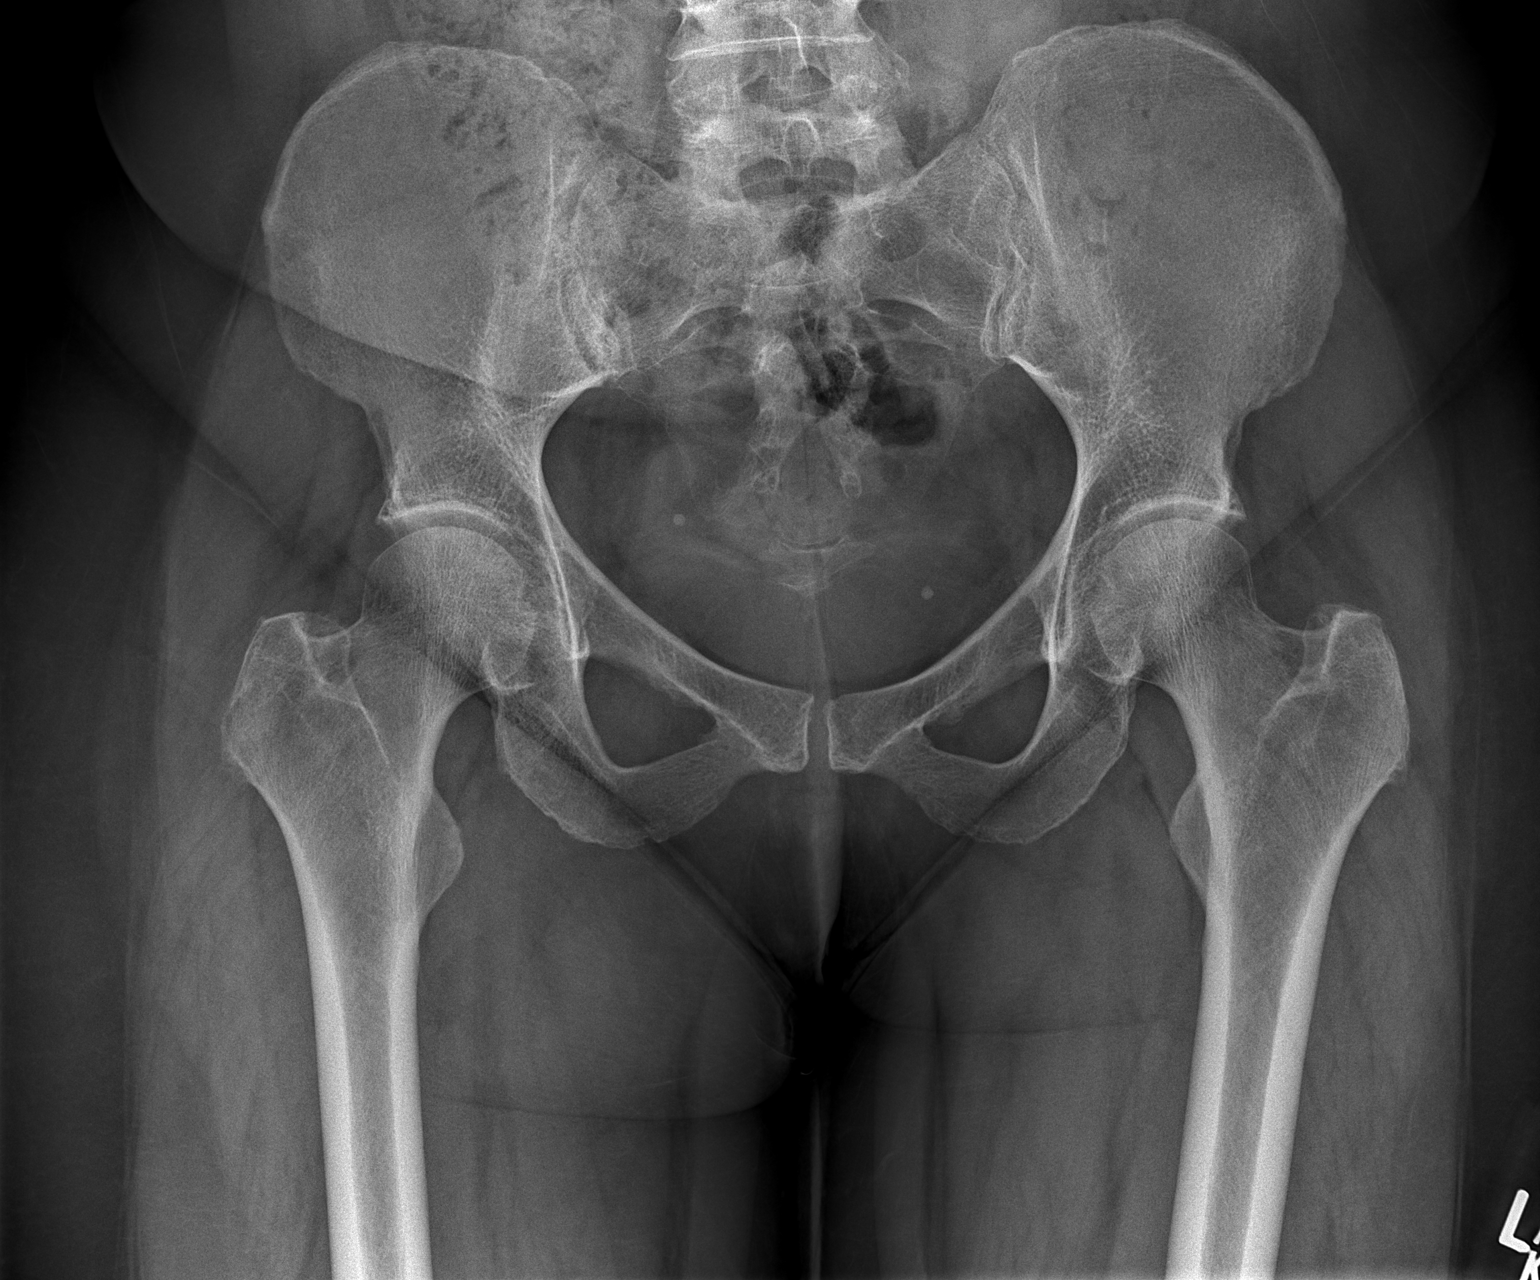

[1 of 1 positions shown; findings below may reference images not displayed]

FINDINGS: There is no evidence of pelvic fracture or diastasis. No pelvic bone
lesions are seen.
IMPRESSION: Negative.

## 2021-03-29 ENCOUNTER — Ambulatory Visit
Admission: EM | Admit: 2021-03-29 | Discharge: 2021-03-29 | Disposition: A | Discharge status: Home / Self Care | Payer: Medicare HMO | Attending: Emergency Medicine | Admitting: Emergency Medicine

## 2021-03-29 ENCOUNTER — Other Ambulatory Visit: Payer: Self-pay

## 2021-03-29 DIAGNOSIS — J029 Acute pharyngitis, unspecified: Secondary | ICD-10-CM | POA: Diagnosis not present

## 2021-03-29 MED ORDER — CLOTRIMAZOLE 10 MG MT TROC: 10.0000 mg | Freq: Every day | OROMUCOSAL | 0 refills | Status: DC

## 2021-03-29 MED ORDER — LORATADINE 10 MG PO TABS: 10.0000 mg | ORAL_TABLET | Freq: Every day | ORAL | 0 refills | Status: DC

## 2021-03-29 MED ORDER — NYSTATIN 100000 UNIT/ML MT SUSP: 10.0000 mL | Freq: Three times a day (TID) | OROMUCOSAL | 0 refills | Status: DC

## 2021-03-29 NOTE — ED Provider Notes (Signed)
[name]    CSN: [id number] Arrival date & time: 03/29/21  1109      History   Chief Complaint Chief Complaint  Patient presents with   Sore Throat   Ear Pain    Left     HPI Lisa Benjamin is a 65 y.o. female presenting today for evaluation of URI symptoms.  Reports associated sore throat and ear pain.  Symptoms began approximately 5 days ago.  Has been using Tylenol and salt water rinses without relief.  Denies associated cough or congestion.  Denies nausea vomiting or diarrhea.  Reports initially sore throat bilateral, but recently has become more prominent on left side.  Pain radiating into neck and left ear.  She denies any fevers chills or body aches.  She denies chest pain or shortness of breath.  She reports that she does use Symbicort twice daily and believes that she has had a white removal patch to her tongue earlier this morning, denies associated tongue pain.  Denies history of thrush.  HPI  Past Medical History:  Diagnosis Date   Microscopic hematuria 11/04   negative work up with Dr. Isabel Caprice   Osteopenia    Vitamin D deficiency     There are no problems to display for this patient.   Past Surgical History:  Procedure Laterality Date   CESAREAN SECTION     x2   ROTATOR CUFF REPAIR Left 12/2005   TUBAL LIGATION Bilateral 1991    OB History     Gravida  2   Para  2   Term  2   Preterm  0   AB  0   Living  2      SAB  0   IAB  0   Ectopic  0   Multiple  0   Live Births  2            Home Medications    Prior to Admission medications   Medication Sig Start Date End Date Taking? Authorizing Provider  clotrimazole (MYCELEX) 10 MG troche Take 1 tablet (10 mg total) by mouth 5 (five) times daily. 03/29/21  Yes Katelinn Justice C, PA-C  loratadine (CLARITIN) 10 MG tablet Take 1 tablet (10 mg total) by mouth daily. 03/29/21  Yes Amish Mintzer C, PA-C  magic mouthwash (nystatin, lidocaine, diphenhydrAMINE, alum & mag  hydroxide) suspension Swish and swallow 10 mLs 3 (three) times daily. 03/29/21  Yes Shakira Los C, PA-C  albuterol (VENTOLIN HFA) 108 (90 Base) MCG/ACT inhaler Inhale 1-2 puffs into the lungs every 6 (six) hours as needed for wheezing or shortness of breath. 11/08/20   Wallis Bamberg, PA-C  budesonide-formoterol [facility]) 160-4.5 MCG/ACT inhaler Inhale 2 puffs into the lungs 2 (two) times daily.    [provider]  calcium carbonate (TUMS - DOSED IN MG ELEMENTAL CALCIUM) 500 MG chewable tablet Chew 2 tablets by mouth daily.     [provider]  Multiple Vitamin (MULTIVITAMIN) tablet Take 1 tablet by mouth daily.    [provider]  Spacer/Aero-Holding Chambers (AEROCHAMBER MV) inhaler Use as instructed 01/02/21   Martina Sinner, MD  VITAMIN D PO Take 2,000 Units by mouth daily.     [provider]    Family History Family History  Problem Relation Age of Onset   Breast cancer Mother 37   Diabetes Mother    Colon cancer Mother    Heart failure Father        CHF  COPD Father    Diabetes Brother    Hypertension Sister        maybe some BP issues   Diabetes Sister    Heart disease Brother 9       angioplasty   Colon cancer Maternal Grandmother     Social History Social History   Tobacco Use   Smoking status: Never   Smokeless tobacco: Never  Vaping Use   Vaping Use: Never used  Substance Use Topics   Alcohol use: No   Drug use: No     Allergies   Patient has no known allergies.   Review of Systems Review of Systems  Constitutional:  Negative for activity change, appetite change, chills, fatigue and fever.  HENT:  Positive for ear pain and sore throat. Negative for congestion, rhinorrhea, sinus pressure and trouble swallowing.   Eyes:  Negative for discharge and redness.  Respiratory:  Negative for cough, chest tightness and shortness of breath.   Cardiovascular:  Negative for chest pain.  Gastrointestinal:  Negative for  abdominal pain, diarrhea, nausea and vomiting.  Musculoskeletal:  Negative for myalgias.  Skin:  Negative for rash.  Neurological:  Negative for dizziness, light-headedness and headaches.    Physical Exam Triage Vital Signs ED Triage Vitals  Enc Vitals Group     BP 03/29/21 1119 (!) 151/71     Pulse Rate 03/29/21 1119 73     Resp 03/29/21 1119 18     Temp 03/29/21 1119 98.1 F (36.7 C)     Temp Source 03/29/21 1119 Oral     SpO2 03/29/21 1119 95 %     Weight --      Height --      Head Circumference --      Peak Flow --      Pain Score 03/29/21 1121 4     Pain Loc --      Pain Edu? --      Excl. in GC? --    No data found.  Updated Vital Signs BP (!) 151/71 (BP Location: Left Arm)   Pulse 73   Temp 98.1 F (36.7 C) (Oral)   Resp 18   LMP 06/14/2010 (Approximate)   SpO2 95%   Visual Acuity Right Eye Distance:   Left Eye Distance:   Bilateral Distance:    Right Eye Near:   Left Eye Near:    Bilateral Near:     Physical Exam Vitals and nursing note reviewed.  Constitutional:      Appearance: She is well-developed.     Comments: No acute distress  HENT:     Head: Normocephalic and atraumatic.     Comments: No facial or submandibular swelling    Ears:     Comments: Bilateral ears without tenderness to palpation of external auricle, tragus and mastoid, EAC's without erythema or swelling, TM's with good bony landmarks and cone of light. Non erythematous.      Nose: Nose normal.     Mouth/Throat:     Comments: Oral mucosa pink and moist, no tonsillar enlargement or exudate. Posterior pharynx patent and nonerythematous, no uvula deviation or swelling. Normal phonation.  Eyes:     Conjunctiva/sclera: Conjunctivae normal.  Neck:     Comments: No overlying swelling or erythema noted to neck, tenderness to palpation in left tonsillar and lower left anterior cervical area with no obvious lymphadenopathy Cardiovascular:     Rate and Rhythm: Normal rate.   Pulmonary:     Effort: Pulmonary effort  is normal. No respiratory distress.     Comments: Breathing comfortably at rest, CTABL, no wheezing, rales or other adventitious sounds auscultated  Abdominal:     General: There is no distension.  Musculoskeletal:        General: Normal range of motion.     Cervical back: Neck supple.  Skin:    General: Skin is warm and dry.  Neurological:     Mental Status: She is alert and oriented to person, place, and time.     UC Treatments / Results  Labs (all labs ordered are listed, but only abnormal results are displayed) Labs Reviewed - No data to display  EKG   Radiology No results found.  Procedures Procedures (including critical care time)  Medications Ordered in UC Medications - No data to display  Initial Impression / Assessment and Plan / UC Course  I have reviewed the triage vital signs and the nursing notes.  Pertinent labs & imaging results that were available during my care of the patient were reviewed by me and considered in my medical decision making (see chart for details).     Sore throat-likely viral, possible early thrush, no signs of tonsillitis or deep space abscess at this time, no swelling noted on exam, vital signs stable.  Recommending symptomatic and supportive care for viral etiology with antihistamines, anti-inflammatories, continue lozenges and Magic mouthwash as needed.  Given patient's use of Symbicort and reported removable white plaque to tongue will provide clotrimazole troche to cover for possible early thrush although no obvious signs noted on exam today.  Discussed strict return precautions. Patient verbalized understanding and is agreeable with plan.  Final Clinical Impressions(s) / UC Diagnoses   Final diagnoses:  Sore throat     Discharge Instructions      Tylenol and ibuprofen as needed for throat pain Rest and drink plenty of fluids Use clotrimazole troche 5 times daily May use Magic  mouthwash up to 3 times daily Daily Claritin Warm compresses to neck Please follow-up if not improving or worsening     ED Prescriptions     Medication Sig Dispense Auth. Provider   clotrimazole (MYCELEX) 10 MG troche Take 1 tablet (10 mg total) by mouth 5 (five) times daily. 35 [address] Ercel Pepitone C, PA-C   magic mouthwash (nystatin, lidocaine, diphenhydrAMINE, alum & mag hydroxide) suspension Swish and swallow 10 mLs 3 (three) times daily. 180 mL Yuriel Lopezmartinez C, PA-C   loratadine (CLARITIN) 10 MG tablet Take 1 tablet (10 mg total) by mouth daily. 15 tablet Endora Teresi, Tok C, PA-C      PDMP not reviewed this encounter.   Lew Dawes, [place] 03/29/21 1202

## 2021-03-29 NOTE — ED Triage Notes (Signed)
Five day h/o sore throat and left ear pain. Has been taking Tylenol and doing salt water rinses without relief. Denies cough, congestion, n/v/d. No known sick contacts.

## 2021-03-29 NOTE — Discharge Instructions (Addendum)
Tylenol and ibuprofen as needed for throat pain Rest and drink plenty of fluids Use clotrimazole troche 5 times daily May use Magic mouthwash up to 3 times daily Daily Claritin Warm compresses to neck Please follow-up if not improving or worsening

## 2021-04-06 ENCOUNTER — Telehealth: Payer: Self-pay | Admitting: Cardiology

## 2021-04-06 NOTE — Telephone Encounter (Signed)
Spoke with patient regarding blood pressure readings and palpitations  She has been having increased stress over the last week  Blood pressure normally runs in the 110's Advised to monitor blood pressure and heart rate for 2 week, log and call back with updated readings  Patient verbalized understanding

## 2021-04-06 NOTE — Telephone Encounter (Signed)
Pt c/o BP issue: STAT if pt c/o blurred vision, one-sided weakness or slurred speech  1. What are your last 5 BP readings? 134/66 and then 144/77  2. Are you having any other symptoms (ex. Dizziness, headache, blurred vision, passed out)? No   3. What is your BP issue? Patient states that she thinks she need something for BP

## 2021-04-18 ENCOUNTER — Ambulatory Visit: Payer: Medicare HMO | Admitting: Pulmonary Disease

## 2021-04-18 ENCOUNTER — Other Ambulatory Visit: Payer: Self-pay | Admitting: *Deleted

## 2021-04-18 ENCOUNTER — Other Ambulatory Visit (HOSPITAL_COMMUNITY)
Admission: RE | Admit: 2021-04-18 | Discharge: 2021-04-18 | Disposition: A | Discharge status: Home / Self Care | Payer: Medicare HMO | Source: Ambulatory Visit | Attending: Pulmonary Disease | Admitting: Pulmonary Disease

## 2021-04-18 DIAGNOSIS — Z20822 Contact with and (suspected) exposure to covid-19: Secondary | ICD-10-CM | POA: Diagnosis not present

## 2021-04-18 DIAGNOSIS — J4540 Moderate persistent asthma, uncomplicated: Secondary | ICD-10-CM

## 2021-04-18 DIAGNOSIS — Z01812 Encounter for preprocedural laboratory examination: Secondary | ICD-10-CM | POA: Insufficient documentation

## 2021-04-19 DIAGNOSIS — Z1231 Encounter for screening mammogram for malignant neoplasm of breast: Secondary | ICD-10-CM | POA: Diagnosis not present

## 2021-04-19 LAB — SARS CORONAVIRUS 2 (TAT 6-24 HRS): SARS Coronavirus 2: NEGATIVE

## 2021-04-20 ENCOUNTER — Encounter: Payer: Self-pay | Admitting: Pulmonary Disease

## 2021-04-20 ENCOUNTER — Ambulatory Visit: Payer: Medicare HMO | Admitting: Pulmonary Disease

## 2021-04-20 ENCOUNTER — Ambulatory Visit (INDEPENDENT_AMBULATORY_CARE_PROVIDER_SITE_OTHER): Payer: Medicare HMO | Admitting: Pulmonary Disease

## 2021-04-20 ENCOUNTER — Other Ambulatory Visit: Payer: Self-pay

## 2021-04-20 VITALS — BP 126/84 | HR 59 | Ht 62.0 in | Wt 181.0 lb

## 2021-04-20 DIAGNOSIS — J4540 Moderate persistent asthma, uncomplicated: Secondary | ICD-10-CM

## 2021-04-20 LAB — PULMONARY FUNCTION TEST
DL/VA % pred: 129 %
DL/VA: 5.48 ml/min/mmHg/L
DLCO cor % pred: 124 %
DLCO cor: 22.98 ml/min/mmHg
DLCO unc % pred: 124 %
DLCO unc: 22.98 ml/min/mmHg
FEF 25-75 Post: 3.61 L/sec
FEF 25-75 Pre: 4.31 L/sec
FEF2575-%Change-Post: -16 %
FEF2575-%Pred-Post: 211 %
FEF2575-%Pred-Pre: 252 %
FEV1-%Change-Post: -3 %
FEV1-%Pred-Post: 136 %
FEV1-%Pred-Pre: 142 %
FEV1-Post: 2.41 L
FEV1-Pre: 2.51 L
FEV1FVC-%Change-Post: 0 %
FEV1FVC-%Pred-Pre: 115 %
FEV6-%Change-Post: -3 %
FEV6-%Pred-Post: 122 %
FEV6-%Pred-Pre: 127 %
FEV6-Post: 2.67 L
FEV6-Pre: 2.77 L
FEV6FVC-%Pred-Post: 104 %
FEV6FVC-%Pred-Pre: 104 %
FVC-%Change-Post: -3 %
FVC-%Pred-Post: 117 %
FVC-%Pred-Pre: 122 %
FVC-Post: 2.67 L
FVC-Pre: 2.77 L
Post FEV1/FVC ratio: 90 %
Post FEV6/FVC ratio: 100 %
Pre FEV1/FVC ratio: 91 %
Pre FEV6/FVC Ratio: 100 %

## 2021-04-20 NOTE — Progress Notes (Signed)
Synopsis: Referred in March 2022 by Roslynn Amble, PA for asthma  Subjective:   PATIENT ID: Lisa Benjamin GENDER: female DOB: January 06, 1956, MRN: [id number]   HPI  Chief Complaint  Patient presents with   Follow-up    3 mo f/u after PFT. States she has been doing well since last visit. Still using Symbicort daily.    Lisa Benjamin is a 65 year old woman, never smoker who returns to pulmonary clinic for asthma follow up.   Patient is doing significantly better since last visit.  She has been on Symbicort 2 puffs twice daily and as needed albuterol.  She reports increased use of albuterol during the month of June due to the hot humid weather where she used it 3-4 times.  She otherwise denies any nighttime awakenings.  She denies any cough, wheezing or shortness of breath at this time.  OV 01/02/21 She reports having history of asthma in her childhood which improved after she was 16. She reports a couple episodes over recent years where she required steroids and antibiotics for bouts of bronchitis.   She was ill with covid 19 in early January and since then has been having issues with wheezing, cough and exertional dyspnea. She has been treated with 3 rounds of steroids, the most recent being earlier this month. She reports feeling better after this third round. She is using symbicort 160-4.45mcg 2 puffs twice daily and as needed albuterol. She continues to experience exeritonal dyspnea with walking. She was planning to participate in a running race this July and was hoping to start training for that soon.   She denies lower extremity edema, redness or pain. She denies chest pain or discomfort.   Past Medical History:  Diagnosis Date   Microscopic hematuria 11/04   negative work up with Dr. Isabel Caprice   Osteopenia    Vitamin D deficiency      Family History  Problem Relation Age of Onset   Breast cancer Mother 55   Diabetes Mother    Colon cancer Mother    Heart failure Father         CHF   COPD Father    Diabetes Brother    Hypertension Sister        maybe some BP issues   Diabetes Sister    Heart disease Brother 29       angioplasty   Colon cancer Maternal Grandmother      Social History   Socioeconomic History   Marital status: Married    Spouse name: Not on file   Number of children: 2   Years of education: Not on file   Highest education level: Not on file  Occupational History    Employer: [employer]  Tobacco Use   Smoking status: Never   Smokeless tobacco: Never  Vaping Use   Vaping Use: Never used  Substance and Sexual Activity   Alcohol use: No   Drug use: No   Sexual activity: Yes    Partners: Male    Birth control/protection: Post-menopausal  Other Topics Concern   Not on file  Social History Narrative   Not on file   Social Determinants of Health   Financial Resource Strain: Not on file  Food Insecurity: Not on file  Transportation Needs: Not on file  Physical Activity: Not on file  Stress: Not on file  Social Connections: Not on file  Intimate Partner Violence: Not on file     No Known Allergies  Outpatient Medications Prior to Visit  Medication Sig Dispense Refill   albuterol (VENTOLIN HFA) 108 (90 Base) MCG/ACT inhaler Inhale 1-2 puffs into the lungs every 6 (six) hours as needed for wheezing or shortness of breath. 18 g 0   budesonide-formoterol (SYMBICORT) 160-4.5 MCG/ACT inhaler Inhale 2 puffs into the lungs 2 (two) times daily.     calcium carbonate (TUMS - DOSED IN MG ELEMENTAL CALCIUM) 500 MG chewable tablet Chew 2 tablets by mouth daily.      Multiple Vitamin (MULTIVITAMIN) tablet Take 1 tablet by mouth daily.     Spacer/Aero-Holding Chambers (AEROCHAMBER MV) inhaler Use as instructed 1 each 0   VITAMIN D PO Take 2,000 Units by mouth daily.      clotrimazole (MYCELEX) 10 MG troche Take 1 tablet (10 mg total) by mouth 5 (five) times daily. 35 Troche 0   loratadine (CLARITIN) 10 MG tablet Take 1 tablet  (10 mg total) by mouth daily. 15 tablet 0   magic mouthwash (nystatin, lidocaine, diphenhydrAMINE, alum & mag hydroxide) suspension Swish and swallow 10 mLs 3 (three) times daily. 180 mL 0   No facility-administered medications prior to visit.    Review of Systems  Constitutional:  Negative for chills, fever, malaise/fatigue and weight loss.  HENT:  Negative for congestion, sinus pain and sore throat.   Eyes: Negative.   Respiratory:  Negative for cough, hemoptysis, sputum production, shortness of breath and wheezing.   Cardiovascular:  Negative for chest pain, palpitations, orthopnea, claudication and leg swelling.  Gastrointestinal:  Negative for abdominal pain, heartburn, nausea and vomiting.  Genitourinary: Negative.   Musculoskeletal:  Negative for joint pain and myalgias.  Skin:  Negative for rash.  Neurological:  Negative for weakness.  Endo/Heme/Allergies: Negative.   Psychiatric/Behavioral: Negative.     Objective:   Vitals:   04/20/21 1150  BP: 126/84  Pulse: (!) 59  SpO2: 100%  Weight: 181 lb (82.1 kg)  Height: 5\' 2"  (1.575 m)     Physical Exam Constitutional:      General: She is not in acute distress.    Appearance: Normal appearance. She is not ill-appearing.  HENT:     Head: Normocephalic and atraumatic.  Eyes:     General: No scleral icterus.    Conjunctiva/sclera: Conjunctivae normal.     Pupils: Pupils are equal, round, and reactive to light.  Cardiovascular:     Rate and Rhythm: Normal rate and regular rhythm.     Pulses: Normal pulses.     Heart sounds: Normal heart sounds. No murmur heard. Pulmonary:     Effort: Pulmonary effort is normal.     Breath sounds: Normal breath sounds. No wheezing, rhonchi or rales.  Musculoskeletal:     Right lower leg: No edema.     Left lower leg: No edema.  Skin:    General: Skin is warm and dry.  Neurological:     General: No focal deficit present.     Mental Status: She is alert.   CBC    Component  Value Date/Time   WBC 5.6 01/18/2020 0948   RBC 4.23 01/18/2020 0948   HGB 12.8 01/18/2020 0948   HGB 12.0 09/27/2013 1346   HCT 39.1 01/18/2020 0948   PLT 327 01/18/2020 0948   MCV 92.4 01/18/2020 0948   MCH 30.3 01/18/2020 0948   MCHC 32.7 01/18/2020 0948   RDW 13.9 01/18/2020 0948   BMP Latest Ref Rng & Units 01/18/2020  Glucose 70 - 99 mg/dL 03/19/2020)  BUN 8 - 23 mg/dL 17  Creatinine 5.63 - 8.75 mg/dL 6.43(P)  Sodium 295 - 188 mmol/L 141  Potassium 3.5 - 5.1 mmol/L 3.9  Chloride 98 - 111 mmol/L 106  CO2 22 - 32 mmol/L 26  Calcium 8.9 - 10.3 mg/dL 9.4   Chest imaging: CXR 12/12/20 The cardiomediastinal contours are normal. The lungs are clear. Pulmonary vasculature is normal. No consolidation, pleural effusion, or pneumothorax. No acute osseous abnormalities are seen.  PFT: PFT Results Latest Ref Rng & Units 04/20/2021  FVC-Pre L 2.77  FVC-Predicted Pre % 122  FVC-Post L 2.67  FVC-Predicted Post % 117  Pre FEV1/FVC % % 91  Post FEV1/FCV % % 90  FEV1-Pre L 2.51  FEV1-Predicted Pre % 142  FEV1-Post L 2.41  DLCO uncorrected ml/min/mmHg 22.98  DLCO UNC% % 124  DLCO corrected ml/min/mmHg 22.98  DLCO COR %Predicted % 124  DLVA Predicted % 129  PFT 04/20/21: within normal limits  Echo 02/08/20:  1. Left ventricular ejection fraction, by estimation, is 60 to 65%. The  left ventricle has normal function. The left ventricle has no regional  wall motion abnormalities. There is mild left ventricular hypertrophy.  Left ventricular diastolic parameters  were normal.   2. Right ventricular systolic function is normal. The right ventricular  size is normal. There is normal pulmonary artery systolic pressure.   3. The mitral valve is grossly normal. Trivial mitral valve  regurgitation.   4. The aortic valve is tricuspid. Aortic valve regurgitation is not  visualized.   5. The inferior vena cava is normal in size with greater than 50%  respiratory variability, suggesting right atrial  pressure of 3 mmHg.  24Hr Holter Monitor 12/11/17 Normal sinus rhythm with normal HR range Rare isolated PACs and PVCs No junctional rhythm seen  Assessment & Plan:   Moderate persistent asthma without complication  Discussion: Lisa Benjamin is a 65 year old woman, never smoker who returns to pulmonary clinic for asthma follow up.   Her asthma appears to be well controlled at this time on Symbicort 2 puffs twice daily.  We discussed weaning her Symbicort usage and monitoring her symptoms closely.  She is to wean down to 1 puff twice daily and then if doing well will then moved to as needed Symbicort use.  She has been instructed to monitor her albuterol requirement and if this increases it is a sign to return to her scheduled Symbicort use.  Her pulmonary function test are within normal limits today.  She is to follow-up in 6 months.  Melody Comas, MD [facility] Office: [phone]   Current Outpatient Medications:    albuterol (VENTOLIN HFA) 108 (90 Base) MCG/ACT inhaler, Inhale 1-2 puffs into the lungs every 6 (six) hours as needed for wheezing or shortness of breath., Disp: 18 g, Rfl: 0   budesonide-formoterol (SYMBICORT) 160-4.5 MCG/ACT inhaler, Inhale 2 puffs into the lungs 2 (two) times daily., Disp: , Rfl:    calcium carbonate (TUMS - DOSED IN MG ELEMENTAL CALCIUM) 500 MG chewable tablet, Chew 2 tablets by mouth daily. , Disp: , Rfl:    Multiple Vitamin (MULTIVITAMIN) tablet, Take 1 tablet by mouth daily., Disp: , Rfl:    Spacer/Aero-Holding Chambers (AEROCHAMBER MV) inhaler, Use as instructed, Disp: 1 each, Rfl: 0   VITAMIN D PO, Take 2,000 Units by mouth daily. , Disp: , Rfl:

## 2021-04-20 NOTE — Patient Instructions (Signed)
We will try to wean your symbicort inhaler now that your breathing is better.  Recommend symbicort inhaler 1 puff twice daily over the next 2-4 weeks. If no increase in need for albuterol then you can try using the symbicort inhaler on an as needed basis. If there is an increase in breathing symptoms then I would recommend returning to scheduled use of symbicort.   You pulmonary function tests today are normal.

## 2021-04-20 NOTE — Progress Notes (Signed)
Full PFT performed today. °

## 2021-04-20 NOTE — Patient Instructions (Signed)
Full PFT performed today. °

## 2021-06-17 ENCOUNTER — Ambulatory Visit
Admission: EM | Admit: 2021-06-17 | Discharge: 2021-06-17 | Disposition: A | Discharge status: Home / Self Care | Payer: Medicare HMO | Attending: Urgent Care | Admitting: Urgent Care

## 2021-06-17 ENCOUNTER — Other Ambulatory Visit: Payer: Self-pay

## 2021-06-17 ENCOUNTER — Encounter: Payer: Self-pay | Admitting: Emergency Medicine

## 2021-06-17 DIAGNOSIS — H9201 Otalgia, right ear: Secondary | ICD-10-CM

## 2021-06-17 MED ORDER — CETIRIZINE HCL 10 MG PO TABS: 10.0000 mg | ORAL_TABLET | Freq: Every day | ORAL | 0 refills | Status: DC

## 2021-06-17 MED ORDER — FLUTICASONE PROPIONATE 50 MCG/ACT NA SUSP: 2.0000 | Freq: Every day | NASAL | 12 refills | Status: DC

## 2021-06-17 MED ORDER — PSEUDOEPHEDRINE HCL 60 MG PO TABS: 60.0000 mg | ORAL_TABLET | Freq: Three times a day (TID) | ORAL | 0 refills | Status: DC | PRN

## 2021-06-17 NOTE — ED Triage Notes (Signed)
Pt here for right ear pain x 2 days worse over night last night

## 2021-06-17 NOTE — ED Provider Notes (Signed)
[facility]   MRN: [id number] DOB: 22-Aug-1956  Subjective:   Lisa Benjamin is a 65 y.o. female presenting for 2-day history of acute onset bilateral intermittent ear pain, fullness.  Symptoms started on the left but got better and are now bothering her on the right side.  Denies fever, dizziness, vertigo, popping, ear drainage.  Denies having history of issue with earwax.  No current facility-administered medications for this encounter.  Current Outpatient Medications:    albuterol (VENTOLIN HFA) 108 (90 Base) MCG/ACT inhaler, Inhale 1-2 puffs into the lungs every 6 (six) hours as needed for wheezing or shortness of breath., Disp: 18 g, Rfl: 0   budesonide-formoterol (SYMBICORT) 160-4.5 MCG/ACT inhaler, Inhale 2 puffs into the lungs 2 (two) times daily., Disp: , Rfl:    calcium carbonate (TUMS - DOSED IN MG ELEMENTAL CALCIUM) 500 MG chewable tablet, Chew 2 tablets by mouth daily. , Disp: , Rfl:    Multiple Vitamin (MULTIVITAMIN) tablet, Take 1 tablet by mouth daily., Disp: , Rfl:    Spacer/Aero-Holding Chambers (AEROCHAMBER MV) inhaler, Use as instructed, Disp: 1 each, Rfl: 0   VITAMIN D PO, Take 2,000 Units by mouth daily. , Disp: , Rfl:     No Known Allergies   Past Medical History:  Diagnosis Date   Microscopic hematuria 11/04   negative work up with Dr. Isabel Caprice   Osteopenia    Vitamin D deficiency      Past Surgical History:  Procedure Laterality Date   CESAREAN SECTION     x2   ROTATOR CUFF REPAIR Left 12/2005   TUBAL LIGATION Bilateral 1991    Family History  Problem Relation Age of Onset   Breast cancer Mother 20   Diabetes Mother    Colon cancer Mother    Heart failure Father        CHF   COPD Father    Diabetes Brother    Hypertension Sister        maybe some BP issues   Diabetes Sister    Heart disease Brother 43       angioplasty   Colon cancer Maternal Grandmother     Social History   Tobacco Use   Smoking status: Never    Smokeless tobacco: Never  Vaping Use   Vaping Use: Never used  Substance Use Topics   Alcohol use: No   Drug use: No    ROS   Objective:   Vitals: BP (!) 152/70 (BP Location: Left Arm)   Pulse 75   Temp 98 F (36.7 C) (Oral)   Resp 18   LMP 06/14/2010 (Approximate)   SpO2 97%   Physical Exam Constitutional:      General: She is not in acute distress.    Appearance: She is well-developed. She is not ill-appearing, toxic-appearing or diaphoretic.  HENT:     Head: Normocephalic and atraumatic.     Right Ear: Tympanic membrane, ear canal and external ear normal. No drainage or tenderness. No middle ear effusion. There is no impacted cerumen. Tympanic membrane is not erythematous.     Left Ear: Tympanic membrane, ear canal and external ear normal. No drainage or tenderness.  No middle ear effusion. There is no impacted cerumen. Tympanic membrane is not erythematous.     Nose: No congestion or rhinorrhea.     Mouth/Throat:     Mouth: Mucous membranes are moist. No oral lesions.     Pharynx: Oropharynx is clear. No pharyngeal swelling, oropharyngeal exudate, posterior oropharyngeal erythema  or uvula swelling.     Tonsils: No tonsillar exudate or tonsillar abscesses.  Eyes:     General: No scleral icterus.       Right eye: No discharge.        Left eye: No discharge.     Extraocular Movements: Extraocular movements intact.     Right eye: Normal extraocular motion.     Left eye: Normal extraocular motion.     Conjunctiva/sclera: Conjunctivae normal.     Pupils: Pupils are equal, round, and reactive to light.  Cardiovascular:     Rate and Rhythm: Normal rate.  Pulmonary:     Effort: Pulmonary effort is normal.  Musculoskeletal:     Cervical back: Normal range of motion and neck supple.  Lymphadenopathy:     Cervical: No cervical adenopathy.  Skin:    General: Skin is warm and dry.  Neurological:     General: No focal deficit present.     Mental Status: She is alert and  oriented to person, place, and time.  Psychiatric:        Mood and Affect: Mood normal.        Behavior: Behavior normal.        Thought Content: Thought content normal.        Judgment: Judgment normal.     Assessment and Plan :   PDMP not reviewed this encounter.  1. Right ear pain     No overt signs of an infectious process such as otitis media, otitis externa or signs of cerumen impaction.  Recommended conservative management with Flonase, Zyrtec, pseudoephedrine. Counseled patient on potential for adverse effects with medications prescribed/recommended today, ER and return-to-clinic precautions discussed, patient verbalized understanding.    Wallis Bamberg, [place] 06/17/21 [phone]

## 2021-06-20 DIAGNOSIS — M26609 Unspecified temporomandibular joint disorder, unspecified side: Secondary | ICD-10-CM | POA: Diagnosis not present

## 2021-07-05 DIAGNOSIS — E785 Hyperlipidemia, unspecified: Secondary | ICD-10-CM | POA: Diagnosis not present

## 2021-07-05 DIAGNOSIS — M25551 Pain in right hip: Secondary | ICD-10-CM | POA: Diagnosis not present

## 2021-07-05 DIAGNOSIS — Z0000 Encounter for general adult medical examination without abnormal findings: Secondary | ICD-10-CM | POA: Diagnosis not present

## 2021-07-05 DIAGNOSIS — R946 Abnormal results of thyroid function studies: Secondary | ICD-10-CM | POA: Diagnosis not present

## 2021-07-05 DIAGNOSIS — I8393 Asymptomatic varicose veins of bilateral lower extremities: Secondary | ICD-10-CM | POA: Diagnosis not present

## 2021-07-05 DIAGNOSIS — D649 Anemia, unspecified: Secondary | ICD-10-CM | POA: Diagnosis not present

## 2021-07-05 DIAGNOSIS — Z23 Encounter for immunization: Secondary | ICD-10-CM | POA: Diagnosis not present

## 2021-07-05 DIAGNOSIS — I493 Ventricular premature depolarization: Secondary | ICD-10-CM | POA: Diagnosis not present

## 2021-07-05 DIAGNOSIS — R69 Illness, unspecified: Secondary | ICD-10-CM | POA: Diagnosis not present

## 2021-07-05 DIAGNOSIS — M8588 Other specified disorders of bone density and structure, other site: Secondary | ICD-10-CM | POA: Diagnosis not present

## 2021-07-05 DIAGNOSIS — E559 Vitamin D deficiency, unspecified: Secondary | ICD-10-CM | POA: Diagnosis not present

## 2021-07-11 DIAGNOSIS — I8312 Varicose veins of left lower extremity with inflammation: Secondary | ICD-10-CM | POA: Diagnosis not present

## 2021-07-24 DIAGNOSIS — I8311 Varicose veins of right lower extremity with inflammation: Secondary | ICD-10-CM | POA: Diagnosis not present

## 2021-07-24 DIAGNOSIS — I8312 Varicose veins of left lower extremity with inflammation: Secondary | ICD-10-CM | POA: Diagnosis not present

## 2021-08-09 DIAGNOSIS — R200 Anesthesia of skin: Secondary | ICD-10-CM | POA: Diagnosis not present

## 2021-08-09 DIAGNOSIS — M25551 Pain in right hip: Secondary | ICD-10-CM | POA: Diagnosis not present

## 2021-08-17 DIAGNOSIS — I8312 Varicose veins of left lower extremity with inflammation: Secondary | ICD-10-CM | POA: Diagnosis not present

## 2021-08-27 ENCOUNTER — Other Ambulatory Visit (HOSPITAL_BASED_OUTPATIENT_CLINIC_OR_DEPARTMENT_OTHER): Payer: Self-pay

## 2021-08-31 DIAGNOSIS — Z803 Family history of malignant neoplasm of breast: Secondary | ICD-10-CM | POA: Diagnosis not present

## 2021-08-31 DIAGNOSIS — E669 Obesity, unspecified: Secondary | ICD-10-CM | POA: Diagnosis not present

## 2021-08-31 DIAGNOSIS — Z825 Family history of asthma and other chronic lower respiratory diseases: Secondary | ICD-10-CM | POA: Diagnosis not present

## 2021-08-31 DIAGNOSIS — M8580 Other specified disorders of bone density and structure, unspecified site: Secondary | ICD-10-CM | POA: Diagnosis not present

## 2021-08-31 DIAGNOSIS — Z833 Family history of diabetes mellitus: Secondary | ICD-10-CM | POA: Diagnosis not present

## 2021-09-05 DIAGNOSIS — M25562 Pain in left knee: Secondary | ICD-10-CM | POA: Diagnosis not present

## 2021-09-05 DIAGNOSIS — R69 Illness, unspecified: Secondary | ICD-10-CM | POA: Diagnosis not present

## 2021-09-05 DIAGNOSIS — M5441 Lumbago with sciatica, right side: Secondary | ICD-10-CM | POA: Diagnosis not present

## 2021-09-10 ENCOUNTER — Ambulatory Visit
Admission: RE | Admit: 2021-09-10 | Discharge: 2021-09-10 | Disposition: A | Discharge status: Home / Self Care | Payer: Medicare HMO | Source: Ambulatory Visit | Attending: Sports Medicine | Admitting: Sports Medicine

## 2021-09-10 ENCOUNTER — Other Ambulatory Visit: Payer: Self-pay | Admitting: Sports Medicine

## 2021-09-10 ENCOUNTER — Other Ambulatory Visit: Payer: Self-pay

## 2021-09-10 DIAGNOSIS — M25562 Pain in left knee: Secondary | ICD-10-CM | POA: Diagnosis not present

## 2021-09-11 ENCOUNTER — Other Ambulatory Visit: Payer: Self-pay | Admitting: Sports Medicine

## 2021-09-11 DIAGNOSIS — M5441 Lumbago with sciatica, right side: Secondary | ICD-10-CM

## 2021-10-04 ENCOUNTER — Other Ambulatory Visit: Payer: Self-pay

## 2021-10-04 ENCOUNTER — Ambulatory Visit
Admission: RE | Admit: 2021-10-04 | Discharge: 2021-10-04 | Disposition: A | Discharge status: Home / Self Care | Payer: Medicare HMO | Source: Ambulatory Visit | Attending: Sports Medicine | Admitting: Sports Medicine

## 2021-10-04 DIAGNOSIS — M48061 Spinal stenosis, lumbar region without neurogenic claudication: Secondary | ICD-10-CM | POA: Diagnosis not present

## 2021-10-04 DIAGNOSIS — M545 Low back pain, unspecified: Secondary | ICD-10-CM | POA: Diagnosis not present

## 2021-10-04 DIAGNOSIS — M5441 Lumbago with sciatica, right side: Secondary | ICD-10-CM

## 2021-10-04 DIAGNOSIS — M5136 Other intervertebral disc degeneration, lumbar region: Secondary | ICD-10-CM | POA: Diagnosis not present

## 2021-10-05 ENCOUNTER — Other Ambulatory Visit: Payer: Medicare HMO

## 2021-10-12 DIAGNOSIS — H1045 Other chronic allergic conjunctivitis: Secondary | ICD-10-CM | POA: Diagnosis not present

## 2021-10-12 DIAGNOSIS — H25813 Combined forms of age-related cataract, bilateral: Secondary | ICD-10-CM | POA: Diagnosis not present

## 2021-10-12 DIAGNOSIS — H5201 Hypermetropia, right eye: Secondary | ICD-10-CM | POA: Diagnosis not present

## 2021-10-12 DIAGNOSIS — H0288B Meibomian gland dysfunction left eye, upper and lower eyelids: Secondary | ICD-10-CM | POA: Diagnosis not present

## 2021-10-12 DIAGNOSIS — H524 Presbyopia: Secondary | ICD-10-CM | POA: Diagnosis not present

## 2021-10-12 DIAGNOSIS — H04123 Dry eye syndrome of bilateral lacrimal glands: Secondary | ICD-10-CM | POA: Diagnosis not present

## 2021-10-12 DIAGNOSIS — H0288A Meibomian gland dysfunction right eye, upper and lower eyelids: Secondary | ICD-10-CM | POA: Diagnosis not present

## 2021-10-12 DIAGNOSIS — H52223 Regular astigmatism, bilateral: Secondary | ICD-10-CM | POA: Diagnosis not present

## 2021-10-23 DIAGNOSIS — M549 Dorsalgia, unspecified: Secondary | ICD-10-CM | POA: Diagnosis not present

## 2021-10-23 DIAGNOSIS — M4805 Spinal stenosis, thoracolumbar region: Secondary | ICD-10-CM | POA: Diagnosis not present

## 2021-10-31 DIAGNOSIS — M25552 Pain in left hip: Secondary | ICD-10-CM | POA: Diagnosis not present

## 2021-11-16 ENCOUNTER — Ambulatory Visit: Payer: Medicare HMO

## 2021-11-23 ENCOUNTER — Other Ambulatory Visit: Payer: Self-pay

## 2021-11-23 ENCOUNTER — Ambulatory Visit: Payer: Medicare HMO | Attending: Sports Medicine

## 2021-11-23 DIAGNOSIS — M6281 Muscle weakness (generalized): Secondary | ICD-10-CM | POA: Insufficient documentation

## 2021-11-23 DIAGNOSIS — M545 Low back pain, unspecified: Secondary | ICD-10-CM | POA: Insufficient documentation

## 2021-11-23 DIAGNOSIS — M25552 Pain in left hip: Secondary | ICD-10-CM | POA: Insufficient documentation

## 2021-11-23 DIAGNOSIS — G8929 Other chronic pain: Secondary | ICD-10-CM | POA: Diagnosis not present

## 2021-11-23 NOTE — Therapy (Signed)
OUTPATIENT PHYSICAL THERAPY LOWER EXTREMITY EVALUATION   Patient Name: Lisa Benjamin MRN: [id number] DOB:April 10, 1956, 66 y.o., female Today's Date: 11/23/2021    Past Medical History:  Diagnosis Date   Microscopic hematuria 11/04   negative work up with Dr. Risa Grill   Osteopenia    Vitamin D deficiency    Past Surgical History:  Procedure Laterality Date   CESAREAN SECTION     x2   ROTATOR CUFF REPAIR Left 12/2005   TUBAL LIGATION Bilateral 1991   There are no problems to display for this patient.   PCP: Harlan Stains, MD  REFERRING PROVIDER: Inez Catalina, MD  REFERRING DIAG: L hip pain  THERAPY DIAG:  Pain in left hip  Muscle weakness (generalized)  ONSET DATE: October 2022  SUBJECTIVE:   SUBJECTIVE STATEMENT: Reports L hip pain worse with prolonged walking/standing  PERTINENT HISTORY: [place] in October 2022, has had R and L hip pain since, received steroid injections which offered temporary relief.   PAIN:  Are you having pain? Yes NPRS scale: 8/10 Pain location: L hip Pain orientation: Left  PAIN TYPE: aching, burning, and throbbing Pain description: intermittent, burning, and tingling  Aggravating factors: standing and walking Relieving factors: sitting  PRECAUTIONS: None  WEIGHT BEARING RESTRICTIONS No  FALLS:  Has patient fallen in last 6 months? Yes, Number of falls: 1  LIVING ENVIRONMENT: Lives with: lives with their spouse Lives in: House/apartment  OCCUPATION: retired Therapist, sports  PLOF: Independent  PATIENT GOALS To relive and manage my hip pain   OBJECTIVE:   DIAGNOSTIC FINDINGS: 1. Multilevel degenerative changes of the lumbar spine as described. Moderate to severe spinal canal stenosis at L3-L4. 2. Small posterior and right lateral subdural fluid collection extending from the visualized lower thoracic spine to S1, suspicious for a dural defect. The site of possible defect is unclear. MRI of the thoracic spine is recommended to  evaluate the cranial extent of the subdural fluid. Neurosurgical consultation is also recommended.  PATIENT SURVEYS:  FOTO 54  COGNITION:  Overall cognitive status: Within functional limits for tasks assessed     SENSATION:  Light touch: Appears intact    MUSCLE LENGTH: Hamstrings: Right 80 deg; Left 80 deg  POSTURE:  WFL  PALPATION: TTP L PSIS/piriformis  LE AROM/PROM: WFL throughout  A/PROM Right 11/23/2021 Left 11/23/2021  Hip flexion    Hip extension    Hip abduction    Hip adduction    Hip internal rotation    Hip external rotation    Knee flexion    Knee extension    Ankle dorsiflexion    Ankle plantarflexion    Ankle inversion    Ankle eversion      LE MMT:  MMT Right 11/23/2021 Left 11/23/2021  Hip flexion    Hip extension    Hip abduction 5 4  Hip adduction    Hip internal rotation    Hip external rotation    Knee flexion    Knee extension    Ankle dorsiflexion    Ankle plantarflexion    Ankle inversion    Ankle eversion     (Blank rows = not tested)  LOWER EXTREMITY SPECIAL TESTS:  Hip special tests: Saralyn Pilar (FABER) test: negative, Trendelenburg test: negative, Piriformis test: positive , and stork test : negative   FUNCTIONAL TESTS:  5 times sit to stand: TBD  GAIT: Distance walked: 17ft Assistive device utilized: None Level of assistance: Complete Independence    TODAY'S TREATMENT: 11/23/21 HEP and Eval   PATIENT  EDUCATION:  Education details: Discussed eval findings, rehab rationale and POC and patient is in agreement  Person educated: Patient Education method: Explanation, Demonstration, Verbal cues, and Handouts Education comprehension: verbalized understanding, returned demonstration, and needs further education   HOME EXERCISE PROGRAM: Access Code: [email or web address] URL: [email or web address] Date: 11/23/2021 Prepared by: Sharlynn Oliphant  Exercises Clamshell - 2 x daily - 7 x weekly - 2 sets - 15 reps - 30s  hold Supine Figure 4 Piriformis Stretch - 2 x daily - 7 x weekly - 1 sets - 3 reps - 30s hold Figure 4 Bridge - 2 x daily - 7 x weekly - 2 sets - 15 reps - 30s hold   ASSESSMENT:  CLINICAL IMPRESSION: Patient is a 66 y.o. female who was seen today for physical therapy evaluation and treatment for L hip pain. She reports partial symptom relief following recent steroid injection.  ROM and strength functional but weakness noted in core and L hip abduction.  Objective impairments include decreased activity tolerance, decreased mobility, difficulty walking, decreased strength, and pain. These impairments are limiting patient from community activity and driving. Personal factors including Fitness and Past/current experiences are also affecting patient's functional outcome. Patient will benefit from skilled PT to address above impairments and improve overall function.  REHAB POTENTIAL: Good  CLINICAL DECISION MAKING: Stable/uncomplicated  EVALUATION COMPLEXITY: Low   GOALS: Goals reviewed with patient? Yes  SHORT TERM GOALS:  STG Name Target Date Goal status  1 Patient to demonstrate independence in HEP  Baseline: [email or web address] 12/07/2021 INITIAL  2 Capture 5x STS Baseline: TBD 12/09/2021 INITIAL  LONG TERM GOALS:   LTG Name Target Date Goal status  1 Minimize L piriformis/PSIS tenderness Baseline:Moderate tenderness 12/23/2021 INITIAL  2 Ensure pelvic alignment is equal Baseline:TBD 12/23/2021 INITIAL  3 Increase L hip abduction strength to 4+5 Baseline: 4/5 L hip abduction strength 12/23/2021 INITIAL  PLAN: PT FREQUENCY: 2x/week  PT DURATION: 4 weeks  PLANNED INTERVENTIONS: Therapeutic exercises, Therapeutic activity, Neuro Muscular re-education, Balance training, Gait training, Patient/Family education, Joint mobilization, Stair training, Aquatic Therapy, and Manual therapy  PLAN FOR NEXT SESSION: L hip strengthening, stretching, stabilization, flexibility and core tasks, piriformis  release   Lanice Shirts, PT 11/23/2021, 8:34 AM

## 2021-11-28 ENCOUNTER — Other Ambulatory Visit: Payer: Self-pay

## 2021-11-28 ENCOUNTER — Ambulatory Visit: Payer: Medicare HMO

## 2021-11-28 DIAGNOSIS — M6281 Muscle weakness (generalized): Secondary | ICD-10-CM | POA: Diagnosis not present

## 2021-11-28 DIAGNOSIS — M25552 Pain in left hip: Secondary | ICD-10-CM

## 2021-11-28 DIAGNOSIS — G8929 Other chronic pain: Secondary | ICD-10-CM | POA: Diagnosis not present

## 2021-11-28 DIAGNOSIS — M545 Low back pain, unspecified: Secondary | ICD-10-CM | POA: Diagnosis not present

## 2021-11-28 NOTE — Therapy (Signed)
OUTPATIENT PHYSICAL THERAPY TREATMENT NOTE   Patient Name: Lisa Benjamin MRN: [id number] DOB:1956-01-17, 66 y.o., female Today's Date: 11/28/2021  PCP: Harlan Stains, MD REFERRING PROVIDER: Inez Catalina, MD   PT End of Session - 11/28/21 1402     Visit Number 2    Number of Visits 8    Date for PT Re-Evaluation 12/23/21    Authorization Type Aetna [facility]    Authorization Time Period 11/23/21-12/30/21    Progress Note Due on Visit 8    PT Start Time 1402    PT Stop Time 1445    PT Time Calculation (min) 43 min    Activity Tolerance Patient tolerated treatment well    Behavior During Therapy [facility] for tasks assessed/performed             Past Medical History:  Diagnosis Date   Microscopic hematuria 11/04   negative work up with Dr. Risa Grill   Osteopenia    Vitamin D deficiency    Past Surgical History:  Procedure Laterality Date   CESAREAN SECTION     x2   ROTATOR CUFF REPAIR Left 12/2005   TUBAL LIGATION Bilateral 1991   There are no problems to display for this patient.   REFERRING DIAG: L hip pain  THERAPY DIAG:  Pain in left hip  Muscle weakness (generalized)  PERTINENT HISTORY: [place] in October 2022, has had R and L hip pain since, received steroid injections which offered temporary relief.   PRECAUTIONS: None  ONSET DATE: October 2022  SUBJECTIVE: I was in a lot of pain after doing the exercises last Friday, so much so that I couldn't do them again on Saturday. I did them again this week and my muscles are sore.   PAIN:  Are you having pain? Yes NPRS scale: Current 4/10 Worst 9/10 Pain location: L hip Pain orientation: Left  PAIN TYPE: aching, burning, and throbbing Pain description: intermittent, burning, and tingling  Aggravating factors: standing and walking Relieving factors: sitting   OBJECTIVE:    DIAGNOSTIC FINDINGS: 1. Multilevel degenerative changes of the lumbar spine as described. Moderate to severe spinal canal stenosis at  L3-L4. 2. Small posterior and right lateral subdural fluid collection extending from the visualized lower thoracic spine to S1, suspicious for a dural defect. The site of possible defect is unclear. MRI of the thoracic spine is recommended to evaluate the cranial extent of the subdural fluid. Neurosurgical consultation is also recommended.   PATIENT SURVEYS:  FOTO 54   COGNITION:          Overall cognitive status: Within functional limits for tasks assessed                        SENSATION:          Light touch: Appears intact             MUSCLE LENGTH: Hamstrings: Right 80 deg; Left 80 deg   POSTURE:  WFL   PALPATION: TTP L PSIS/piriformis   LE AROM/PROM: WFL throughout   A/PROM Right 11/23/2021 Left 11/23/2021  Hip flexion      Hip extension      Hip abduction      Hip adduction      Hip internal rotation      Hip external rotation      Knee flexion      Knee extension      Ankle dorsiflexion      Ankle plantarflexion  Ankle inversion      Ankle eversion        LE MMT:   MMT Right 11/23/2021 Left 11/23/2021  Hip flexion      Hip extension      Hip abduction 5 4  Hip adduction      Hip internal rotation      Hip external rotation      Knee flexion      Knee extension      Ankle dorsiflexion      Ankle plantarflexion      Ankle inversion      Ankle eversion       (Blank rows = not tested)   LOWER EXTREMITY SPECIAL TESTS:  Hip special tests: Saralyn Pilar (FABER) test: negative, Trendelenburg test: negative, Piriformis test: positive , and stork test : negative    FUNCTIONAL TESTS:  5 times sit to stand: TBD 11/28/2021: 7.2 sec   GAIT: Distance walked: 173ft Assistive device utilized: None Level of assistance: Complete Independence       TODAY'S TREATMENT: [facility] Adult PT Treatment:                                                DATE: 11/28/2021 Therapeutic Exercise: Nustep level 4 x 5 mins while gathering subjective Standing hip abduction 17.5# 2  x 10 L Standing hip extension 17.5# 2 x 10 L Hookying clamshell RTB 2 x 10 Hoolying march RTB 2 x 10 BIL Hip adduction ball squeeze 5 sec hold 2 x 10 Bridge with ball squeeze x 10 Supine figure 4 piriformis stretch 2 x 30 sec BIL Supine hamstring stretch with strap 2 x 30" BIL Supine ITB stretch with strap 2 x 30" BIL Supine butterfly stretch 2 x 30" STS x 5 (7.2 sec) Manual Therapy: Tiger tail and STM on L glute and ITB x 8 mins   [facility] Adult PT Treatment:                                                DATE: 11/23/2021 11/23/21 HEP and Eval     PATIENT EDUCATION:  Education details: Assessed HEP response, Discussed eval findings, rehab rationale and POC and patient is in agreement  Person educated: Patient Education method: Explanation, Demonstration, Verbal cues, and Handouts Education comprehension: verbalized understanding, returned demonstration, and needs further education     HOME EXERCISE PROGRAM: Access Code: [email or web address] URL: [email or web address] Date: 11/23/2021 Prepared by: Sharlynn Oliphant   Exercises Clamshell - 2 x daily - 7 x weekly - 2 sets - 15 reps - 30s hold Supine Figure 4 Piriformis Stretch - 2 x daily - 7 x weekly - 1 sets - 3 reps - 30s hold Figure 4 Bridge - 2 x daily - 7 x weekly - 2 sets - 15 reps - 30s hold     ASSESSMENT:   CLINICAL IMPRESSION: Patient presents to PT with decreased overall pain level currently but 9/10 pain at her worst after she completed her exercises at home. Patient may be interested in TPDN if able to help tenderness and pain levels. Patient has noted tenderness and tightness in L glute and ITB area. Continue focus on strengthening hip and core and stretching. Patient continues to benefit  from skilled PT services and should be progressed as able to improve functional independence.    REHAB POTENTIAL: Good   CLINICAL DECISION MAKING: Stable/uncomplicated   EVALUATION COMPLEXITY: Low     GOALS: Goals reviewed  with patient? Yes   SHORT TERM GOALS:   STG Name Target Date Goal status  1 Patient to demonstrate independence in HEP  Baseline: [id number] 12/07/2021 INITIAL  2 Capture 5x STS Baseline: 7.2 sec 11/28/2021 12/09/2021 INITIAL  LONG TERM GOALS:    LTG Name Target Date Goal status  1 Minimize L piriformis/PSIS tenderness Baseline:Moderate tenderness 12/23/2021 INITIAL  2 Ensure pelvic alignment is equal Baseline:TBD 12/23/2021 INITIAL  3 Increase L hip abduction strength to 4+5 Baseline: 4/5 L hip abduction strength 12/23/2021 INITIAL  PLAN: PT FREQUENCY: 2x/week   PT DURATION: 4 weeks   PLANNED INTERVENTIONS: Therapeutic exercises, Therapeutic activity, Neuro Muscular re-education, Balance training, Gait training, Patient/Family education, Joint mobilization, Stair training, Aquatic Therapy, and Manual therapy   PLAN FOR NEXT SESSION: L hip strengthening, stretching, stabilization, flexibility and core tasks, piriformis release    Evelene Croon, PTA 11/28/2021, 2:52 PM

## 2021-11-29 ENCOUNTER — Telehealth: Payer: Self-pay | Admitting: Pulmonary Disease

## 2021-11-29 MED ORDER — ALBUTEROL SULFATE HFA 108 (90 BASE) MCG/ACT IN AERS: 1.0000 | INHALATION_SPRAY | Freq: Four times a day (QID) | RESPIRATORY_TRACT | 0 refills | Status: DC | PRN

## 2021-11-29 NOTE — Telephone Encounter (Signed)
Called and spoke with the pt She is needing refill on albuterol inhaler  Rx was sent  Pt scheduled for appt 12/05/21

## 2021-11-30 DIAGNOSIS — M4316 Spondylolisthesis, lumbar region: Secondary | ICD-10-CM | POA: Diagnosis not present

## 2021-12-01 ENCOUNTER — Other Ambulatory Visit: Payer: Self-pay

## 2021-12-01 ENCOUNTER — Ambulatory Visit: Payer: Medicare HMO

## 2021-12-01 DIAGNOSIS — M25552 Pain in left hip: Secondary | ICD-10-CM | POA: Diagnosis not present

## 2021-12-01 DIAGNOSIS — M6281 Muscle weakness (generalized): Secondary | ICD-10-CM | POA: Diagnosis not present

## 2021-12-01 DIAGNOSIS — M545 Low back pain, unspecified: Secondary | ICD-10-CM | POA: Diagnosis not present

## 2021-12-01 DIAGNOSIS — G8929 Other chronic pain: Secondary | ICD-10-CM | POA: Diagnosis not present

## 2021-12-01 NOTE — Therapy (Signed)
OUTPATIENT PHYSICAL THERAPY TREATMENT NOTE   Patient Name: Lisa Benjamin MRN: [id number] DOB:May 24, 1956, 66 y.o., female Today's Date: 12/01/2021  PCP: Harlan Stains, MD REFERRING PROVIDER: Harlan Stains, MD   PT End of Session - 12/01/21 1037     Visit Number 3    Number of Visits 8    Date for PT Re-Evaluation 12/23/21    Authorization Type Aetna [facility]    Authorization Time Period 11/23/21-12/30/21    Progress Note Due on Visit 8    PT Start Time 1033    PT Stop Time 1115    PT Time Calculation (min) 42 min    Activity Tolerance Patient tolerated treatment well    Behavior During Therapy [facility] for tasks assessed/performed              Past Medical History:  Diagnosis Date   Microscopic hematuria 11/04   negative work up with Dr. Risa Grill   Osteopenia    Vitamin D deficiency    Past Surgical History:  Procedure Laterality Date   CESAREAN SECTION     x2   ROTATOR CUFF REPAIR Left 12/2005   TUBAL LIGATION Bilateral 1991   There are no problems to display for this patient.   REFERRING DIAG: L hip pain  THERAPY DIAG:  Pain in left hip  Muscle weakness (generalized)  PERTINENT HISTORY: [place] in October 2022, has had R and L hip pain since, received steroid injections which offered temporary relief.   PRECAUTIONS: None  ONSET DATE: October 2022  SUBJECTIVE: I haven't been in much pain and haven't had to take any medicine. My neurosurgeon gave me more exercises to do and I've been doing them as well as my HEP.  PAIN:  Are you having pain? No current NPRS scale: Current 0/10 Worst 3/10 Pain location: L hip Pain orientation: Left  PAIN TYPE: aching, burning, and throbbing Pain description: intermittent, burning, and tingling  Aggravating factors: standing and walking Relieving factors: sitting   OBJECTIVE:    DIAGNOSTIC FINDINGS: 1. Multilevel degenerative changes of the lumbar spine as described. Moderate to severe spinal canal stenosis at L3-L4. 2.  Small posterior and right lateral subdural fluid collection extending from the visualized lower thoracic spine to S1, suspicious for a dural defect. The site of possible defect is unclear. MRI of the thoracic spine is recommended to evaluate the cranial extent of the subdural fluid. Neurosurgical consultation is also recommended.   PATIENT SURVEYS:  FOTO 54   COGNITION:          Overall cognitive status: Within functional limits for tasks assessed                        SENSATION:          Light touch: Appears intact             MUSCLE LENGTH: Hamstrings: Right 80 deg; Left 80 deg   POSTURE:  WFL   PALPATION: TTP L PSIS/piriformis   LE AROM/PROM: WFL throughout   A/PROM Right 11/23/2021 Left 11/23/2021  Hip flexion      Hip extension      Hip abduction      Hip adduction      Hip internal rotation      Hip external rotation      Knee flexion      Knee extension      Ankle dorsiflexion      Ankle plantarflexion      Ankle inversion  Ankle eversion        LE MMT:   MMT Right 11/23/2021 Left 11/23/2021  Hip flexion      Hip extension      Hip abduction 5 4  Hip adduction      Hip internal rotation      Hip external rotation      Knee flexion      Knee extension      Ankle dorsiflexion      Ankle plantarflexion      Ankle inversion      Ankle eversion       (Blank rows = not tested)   LOWER EXTREMITY SPECIAL TESTS:  Hip special tests: Saralyn Pilar (FABER) test: negative, Trendelenburg test: negative, Piriformis test: positive , and stork test : negative    FUNCTIONAL TESTS:  5 times sit to stand: TBD 11/28/2021: 7.2 sec   GAIT: Distance walked: 138f Assistive device utilized: None Level of assistance: Complete Independence       TODAY'S TREATMENT: [facility] Adult PT Treatment:                                                DATE: 12/01/2021 Therapeutic Exercise: Nustep level 5 x 5 mins while gathering subjective Standing hip abduction 17.5# 2 x 10  L Standing hip extension 17.5# 2 x 10 L Hookying clamshell GTB 2 x 10 Bridge 2 x 10 Supine figure 4 piriformis stretch 2 x 30 sec BIL Supine hamstring stretch with strap 2 x 30" BIL Supine ITB stretch with strap 2 x 30" BIL Supine butterfly stretch 2 x 30" STS 2 x 10 Manual Therapy: Tiger tail and STM on L glute and ITB x 8 mins   [facility] Adult PT Treatment:                                                DATE: 11/28/2021 Therapeutic Exercise: Nustep level 4 x 5 mins while gathering subjective Standing hip abduction 17.5# 2 x 10 L Standing hip extension 17.5# 2 x 10 L Hookying clamshell RTB 2 x 10 Hoolying march RTB 2 x 10 BIL Hip adduction ball squeeze 5 sec hold 2 x 10 Bridge with ball squeeze x 10 Supine figure 4 piriformis stretch 2 x 30 sec BIL Supine hamstring stretch with strap 2 x 30" BIL Supine ITB stretch with strap 2 x 30" BIL Supine butterfly stretch 2 x 30" STS x 5 (7.2 sec) Manual Therapy: Tiger tail and STM on L glute and ITB x 8 mins   [facility] Adult PT Treatment:                                                DATE: 11/23/2021 11/23/21 HEP and Eval     PATIENT EDUCATION:  Education details: Assessed HEP response, Discussed eval findings, rehab rationale and POC and patient is in agreement  Person educated: Patient Education method: Explanation, Demonstration, Verbal cues, and Handouts Education comprehension: verbalized understanding, returned demonstration, and needs further education     HOME EXERCISE PROGRAM: Access Code: [email or web address]: [email or web address] Date: 11/23/2021 Prepared  by: Sharlynn Oliphant   Exercises Clamshell - 2 x daily - 7 x weekly - 2 sets - 15 reps - 30s hold Supine Figure 4 Piriformis Stretch - 2 x daily - 7 x weekly - 1 sets - 3 reps - 30s hold Figure 4 Bridge - 2 x daily - 7 x weekly - 2 sets - 15 reps - 30s hold     ASSESSMENT:   CLINICAL IMPRESSION: Patient presents to PT with decreased overall pain level, worst  being 3/10 in past 48 hours. She reports she received additional exercises from her neurosurgeon at her appointment yesterday and has been doing them in addition to her HEP and hasn't had any increase in pain. Patient continues to benefit from skilled PT services and should be progressed as able to improve functional independence.    REHAB POTENTIAL: Good   CLINICAL DECISION MAKING: Stable/uncomplicated   EVALUATION COMPLEXITY: Low     GOALS: Goals reviewed with patient? Yes   SHORT TERM GOALS:   STG Name Target Date Goal status  1 Patient to demonstrate independence in HEP  Baseline: [id number] 12/07/2021 MET  2 Capture 5x STS Baseline: 7.2 sec 11/28/2021 12/09/2021 CAPTURED  LONG TERM GOALS:    LTG Name Target Date Goal status  1 Minimize L piriformis/PSIS tenderness Baseline:Moderate tenderness 12/23/2021 INITIAL  2 Ensure pelvic alignment is equal Baseline:TBD 12/23/2021 INITIAL  3 Increase L hip abduction strength to 4+5 Baseline: 4/5 L hip abduction strength 12/23/2021 INITIAL  PLAN: PT FREQUENCY: 2x/week   PT DURATION: 4 weeks   PLANNED INTERVENTIONS: Therapeutic exercises, Therapeutic activity, Neuro Muscular re-education, Balance training, Gait training, Patient/Family education, Joint mobilization, Stair training, Aquatic Therapy, and Manual therapy   PLAN FOR NEXT SESSION: L hip strengthening, stretching, stabilization, flexibility and core tasks, piriformis release    Evelene Croon, PTA 12/01/2021, 11:01 AM

## 2021-12-05 ENCOUNTER — Other Ambulatory Visit: Payer: Self-pay

## 2021-12-05 ENCOUNTER — Encounter: Payer: Self-pay | Admitting: Pulmonary Disease

## 2021-12-05 ENCOUNTER — Ambulatory Visit: Payer: Medicare HMO | Admitting: Pulmonary Disease

## 2021-12-05 VITALS — BP 128/62 | HR 64 | Ht 62.0 in | Wt 176.8 lb

## 2021-12-05 DIAGNOSIS — J4520 Mild intermittent asthma, uncomplicated: Secondary | ICD-10-CM | POA: Diagnosis not present

## 2021-12-05 NOTE — Patient Instructions (Addendum)
Continue to use albuterol inhaler as needed  Please call us if symptoms increase and we need to start back the symbicort inhaler  Follow up in 1 year.

## 2021-12-05 NOTE — Progress Notes (Signed)
Synopsis: Referred in March 2022 by Benjiman Core, PA for asthma  Subjective:   PATIENT ID: Lisa Benjamin GENDER: female DOB: 1956-10-08, MRN: [id number]  HPI  Chief Complaint  Patient presents with   Follow-up    6 mo f/u of asthma. States she has been doing well since last visit.    Lisa Benjamin is a 66 year old woman, never smoker who returns to pulmonary clinic for asthma follow up.   She has been doing well since last visit and has not required Symbicort inhaler as needed.  She is using albuterol inhaler very infrequently at this time.  She denies any cough, wheezing or shortness of breath at this time.  She denies issues with seasonal allergies.  OV 04/20/21 Patient is doing significantly better since last visit.  She has been on Symbicort 2 puffs twice daily and as needed albuterol.  She reports increased use of albuterol during the month of June due to the hot humid weather where she used it 3-4 times.  She otherwise denies any nighttime awakenings.  She denies any cough, wheezing or shortness of breath at this time.  OV 01/02/21 She reports having history of asthma in her childhood which improved after she was 16. She reports a couple episodes over recent years where she required steroids and antibiotics for bouts of bronchitis.   She was ill with covid 19 in early January and since then has been having issues with wheezing, cough and exertional dyspnea. She has been treated with 3 rounds of steroids, the most recent being earlier this month. She reports feeling better after this third round. She is using symbicort 160-4.37mcg 2 puffs twice daily and as needed albuterol. She continues to experience exeritonal dyspnea with walking. She was planning to participate in a running race this July and was hoping to start training for that soon.   She denies lower extremity edema, redness or pain. She denies chest pain or discomfort.   Past Medical History:  Diagnosis Date   Microscopic  hematuria 11/04   negative work up with Dr. Risa Grill   Osteopenia    Vitamin D deficiency      Family History  Problem Relation Age of Onset   Breast cancer Mother 32   Diabetes Mother    Colon cancer Mother    Heart failure Father        CHF   COPD Father    Diabetes Brother    Hypertension Sister        maybe some BP issues   Diabetes Sister    Heart disease Brother 88       angioplasty   Colon cancer Maternal Grandmother      Social History   Socioeconomic History   Marital status: Married    Spouse name: Not on file   Number of children: 2   Years of education: Not on file   Highest education level: Not on file  Occupational History    Employer: [employer]  Tobacco Use   Smoking status: Never   Smokeless tobacco: Never  Vaping Use   Vaping Use: Never used  Substance and Sexual Activity   Alcohol use: No   Drug use: No   Sexual activity: Yes    Partners: Male    Birth control/protection: Post-menopausal  Other Topics Concern   Not on file  Social History Narrative   Not on file   Social Determinants of Health   Financial Resource Strain: Not on file  Food Insecurity: Not on file  Transportation Needs: Not on file  Physical Activity: Not on file  Stress: Not on file  Social Connections: Not on file  Intimate Partner Violence: Not on file     No Known Allergies   Outpatient Medications Prior to Visit  Medication Sig Dispense Refill   albuterol (VENTOLIN HFA) 108 (90 Base) MCG/ACT inhaler Inhale 1-2 puffs into the lungs every 6 (six) hours as needed for wheezing or shortness of breath. 18 g 0   calcium carbonate (TUMS - DOSED IN MG ELEMENTAL CALCIUM) 500 MG chewable tablet Chew 2 tablets by mouth daily.      Multiple Vitamin (MULTIVITAMIN) tablet Take 1 tablet by mouth daily.     Spacer/Aero-Holding Chambers (AEROCHAMBER MV) inhaler Use as instructed 1 each 0   VITAMIN D PO Take 2,000 Units by mouth daily.      budesonide-formoterol  (SYMBICORT) 160-4.5 MCG/ACT inhaler Inhale 2 puffs into the lungs 2 (two) times daily.     cetirizine (ZYRTEC ALLERGY) 10 MG tablet Take 1 tablet (10 mg total) by mouth daily. 30 tablet 0   fluticasone (FLONASE) 50 MCG/ACT nasal spray Place 2 sprays into both nostrils daily. 16 g 12   pseudoephedrine (SUDAFED) 60 MG tablet Take 1 tablet (60 mg total) by mouth every 8 (eight) hours as needed for congestion. 30 tablet 0   No facility-administered medications prior to visit.    Review of Systems  Constitutional:  Negative for chills, fever, malaise/fatigue and weight loss.  HENT:  Negative for congestion, sinus pain and sore throat.   Eyes: Negative.   Respiratory:  Negative for cough, hemoptysis, sputum production, shortness of breath and wheezing.   Cardiovascular:  Negative for chest pain, palpitations, orthopnea, claudication and leg swelling.  Gastrointestinal:  Negative for abdominal pain, heartburn, nausea and vomiting.  Genitourinary: Negative.   Musculoskeletal:  Negative for joint pain and myalgias.  Skin:  Negative for rash.  Neurological:  Negative for weakness.  Endo/Heme/Allergies: Negative.   Psychiatric/Behavioral: Negative.     Objective:   Vitals:   12/05/21 1630  BP: 128/62  Pulse: 64  SpO2: 100%  Weight: 176 lb 12.8 oz (80.2 kg)  Height: 5\' 2"  (1.575 m)     Physical Exam Constitutional:      General: She is not in acute distress.    Appearance: Normal appearance. She is not ill-appearing.  HENT:     Head: Normocephalic and atraumatic.  Eyes:     General: No scleral icterus.    Conjunctiva/sclera: Conjunctivae normal.     Pupils: Pupils are equal, round, and reactive to light.  Cardiovascular:     Rate and Rhythm: Normal rate and regular rhythm.     Pulses: Normal pulses.     Heart sounds: Normal heart sounds. No murmur heard. Pulmonary:     Effort: Pulmonary effort is normal.     Breath sounds: Normal breath sounds. No wheezing, rhonchi or rales.   Musculoskeletal:     Right lower leg: No edema.     Left lower leg: No edema.  Skin:    General: Skin is warm and dry.  Neurological:     General: No focal deficit present.     Mental Status: She is alert.   CBC    Component Value Date/Time   WBC 5.6 01/18/2020 0948   RBC 4.23 01/18/2020 0948   HGB 12.8 01/18/2020 0948   HGB 12.0 09/27/2013 1346   HCT 39.1 01/18/2020 0948   PLT  327 01/18/2020 0948   MCV 92.4 01/18/2020 0948   MCH 30.3 01/18/2020 0948   MCHC 32.7 01/18/2020 0948   RDW 13.9 01/18/2020 0948   BMP Latest Ref Rng & Units 01/18/2020  Glucose 70 - 99 mg/dL 108(H)  BUN 8 - 23 mg/dL 17  Creatinine 0.44 - 1.00 mg/dL 1.21(H)  Sodium 135 - 145 mmol/L 141  Potassium 3.5 - 5.1 mmol/L 3.9  Chloride 98 - 111 mmol/L 106  CO2 22 - 32 mmol/L 26  Calcium 8.9 - 10.3 mg/dL 9.4   Chest imaging: CXR 12/12/20 The cardiomediastinal contours are normal. The lungs are clear. Pulmonary vasculature is normal. No consolidation, pleural effusion, or pneumothorax. No acute osseous abnormalities are seen.  PFT: PFT Results Latest Ref Rng & Units 04/20/2021  FVC-Pre L 2.77  FVC-Predicted Pre % 122  FVC-Post L 2.67  FVC-Predicted Post % 117  Pre FEV1/FVC % % 91  Post FEV1/FCV % % 90  FEV1-Pre L 2.51  FEV1-Predicted Pre % 142  FEV1-Post L 2.41  DLCO uncorrected ml/min/mmHg 22.98  DLCO UNC% % 124  DLCO corrected ml/min/mmHg 22.98  DLCO COR %Predicted % 124  DLVA Predicted % 129  PFT 04/20/21: within normal limits  Echo 02/08/20:  1. Left ventricular ejection fraction, by estimation, is 60 to 65%. The  left ventricle has normal function. The left ventricle has no regional  wall motion abnormalities. There is mild left ventricular hypertrophy.  Left ventricular diastolic parameters  were normal.   2. Right ventricular systolic function is normal. The right ventricular  size is normal. There is normal pulmonary artery systolic pressure.   3. The mitral valve is grossly normal.  Trivial mitral valve  regurgitation.   4. The aortic valve is tricuspid. Aortic valve regurgitation is not  visualized.   5. The inferior vena cava is normal in size with greater than 50%  respiratory variability, suggesting right atrial pressure of 3 mmHg.  24Hr Holter Monitor 12/11/17 Normal sinus rhythm with normal HR range Rare isolated PACs and PVCs No junctional rhythm seen  Assessment & Plan:   Mild intermittent asthma without complication  Discussion: Lisa Benjamin is a 66 year old woman, never smoker who returns to pulmonary clinic for asthma follow up.   She has mild intermittent asthma.  She is doing well on as needed albuterol at this time after tapering off Symbicort 160-4.5 mcg inhaler after last visit due to an increase in reactive airways symptoms from a recent COVID infection.  She is to continue as needed albuterol.  If symptoms increase she is to notify our clinic and we will send in a prescription for her to continue on Symbicort.  Follow-up in 1 year.  Freda Jackson, MD [facility] Office: [phone]   Current Outpatient Medications:    albuterol (VENTOLIN HFA) 108 (90 Base) MCG/ACT inhaler, Inhale 1-2 puffs into the lungs every 6 (six) hours as needed for wheezing or shortness of breath., Disp: 18 g, Rfl: 0   calcium carbonate (TUMS - DOSED IN MG ELEMENTAL CALCIUM) 500 MG chewable tablet, Chew 2 tablets by mouth daily. , Disp: , Rfl:    Multiple Vitamin (MULTIVITAMIN) tablet, Take 1 tablet by mouth daily., Disp: , Rfl:    Spacer/Aero-Holding Chambers (AEROCHAMBER MV) inhaler, Use as instructed, Disp: 1 each, Rfl: 0   VITAMIN D PO, Take 2,000 Units by mouth daily. , Disp: , Rfl:

## 2021-12-06 ENCOUNTER — Encounter: Payer: Self-pay | Admitting: Pulmonary Disease

## 2021-12-06 ENCOUNTER — Ambulatory Visit: Payer: Medicare HMO

## 2021-12-06 DIAGNOSIS — G8929 Other chronic pain: Secondary | ICD-10-CM

## 2021-12-06 DIAGNOSIS — M6281 Muscle weakness (generalized): Secondary | ICD-10-CM

## 2021-12-06 DIAGNOSIS — M25552 Pain in left hip: Secondary | ICD-10-CM | POA: Diagnosis not present

## 2021-12-06 DIAGNOSIS — M545 Low back pain, unspecified: Secondary | ICD-10-CM | POA: Diagnosis not present

## 2021-12-06 NOTE — Therapy (Signed)
OUTPATIENT PHYSICAL THERAPY TREATMENT NOTE   Patient Name: Lisa Benjamin MRN: [id number] DOB:04-Feb-1956, 66 y.o., female Today's Date: 12/06/2021  PCP: Harlan Stains, MD REFERRING PROVIDER: Inez Catalina, MD   PT End of Session - 12/06/21 1217     Visit Number 4    Number of Visits 8    Date for PT Re-Evaluation 12/23/21    Authorization Type [employer]na [facility]    Authorization Time Period 11/23/21-12/30/21    Progress Note Due on Visit 8    PT Start Time 1215    PT Stop Time 1300    PT Time Calculation (min) 45 min    Activity Tolerance Patient tolerated treatment well    Behavior During Therapy [facility] for tasks assessed/performed              Past Medical History:  Diagnosis Date   Microscopic hematuria 11/04   negative work up with Dr. Risa Grill   Osteopenia    Vitamin D deficiency    Past Surgical History:  Procedure Laterality Date   CESAREAN SECTION     x2   ROTATOR CUFF REPAIR Left 12/2005   TUBAL LIGATION Bilateral 1991   There are no problems to display for this patient.   REFERRING DIAG: L hip pain  THERAPY DIAG:  Pain in left hip  Muscle weakness (generalized)  Chronic bilateral low back pain, unspecified whether sciatica present  PERTINENT HISTORY: [place] in October 2022, has had R and L hip pain since, received steroid injections which offered temporary relief.   PRECAUTIONS: None  ONSET DATE: October 2022  SUBJECTIVE: Patient has been performing flexion based lumbar exercises as prescribed by neuro surgeon including child's pose, cat/camel and prone extension, verbally reviewed these with patient.  L hip pain minimal today, worse with prolonged walking and standing however.  PAIN:  Are you having pain? No current NPRS scale: Current 0/10 Worst 3/10 Pain location: L hip/Greater trochanter Pain orientation: Left  PAIN TYPE: aching, burning, and throbbing Pain description: intermittent, burning, and tingling  Aggravating factors: standing and  walking Relieving factors: sitting   OBJECTIVE:    DIAGNOSTIC FINDINGS: 1. Multilevel degenerative changes of the lumbar spine as described. Moderate to severe spinal canal stenosis at L3-L4. 2. Small posterior and right lateral subdural fluid collection extending from the visualized lower thoracic spine to S1, suspicious for a dural defect. The site of possible defect is unclear. MRI of the thoracic spine is recommended to evaluate the cranial extent of the subdural fluid. Neurosurgical consultation is also recommended.   PATIENT SURVEYS:  FOTO 54   COGNITION:          Overall cognitive status: Within functional limits for tasks assessed                        SENSATION:          Light touch: Appears intact             MUSCLE LENGTH: Hamstrings: Right 80 deg; Left 80 deg   POSTURE:  WFL   PALPATION: TTP L PSIS/piriformis   LE AROM/PROM: WFL throughout   A/PROM Right 11/23/2021 Left 11/23/2021  Hip flexion      Hip extension      Hip abduction      Hip adduction      Hip internal rotation      Hip external rotation      Knee flexion      Knee extension  Ankle dorsiflexion      Ankle plantarflexion      Ankle inversion      Ankle eversion        LE MMT:   MMT Right 11/23/2021 Left 11/23/2021  Hip flexion      Hip extension      Hip abduction 5 4  Hip adduction      Hip internal rotation      Hip external rotation      Knee flexion      Knee extension      Ankle dorsiflexion      Ankle plantarflexion      Ankle inversion      Ankle eversion       (Blank rows = not tested)   LOWER EXTREMITY SPECIAL TESTS:  Hip special tests: Saralyn Pilar (FABER) test: negative, Trendelenburg test: negative, Piriformis test: positive , and stork test : negative    FUNCTIONAL TESTS:  5 times sit to stand: TBD 11/28/2021: 7.2 sec   GAIT: Distance walked: 173f Assistive device utilized: None Level of assistance: Complete Independence       TODAY'S  TREATMENT: [facility] Adult PT Treatment:                                                DATE: 12/06/21 Therapeutic Exercise: Nustep level 4 x 8 mins  Curl ups 15x PPT 15x Supine march alt 15x each L clamshells in sidelie 30x to fatigue L ITB stretch in supine and sidelie w/o symptoms L abduction in sodelie with PT maintaining slight hip extension to isolate gluteus medius 30x Manual Therapy: STM to L piriformis in R SL 8 min   [facility] Adult PT Treatment:                                                DATE: 12/01/2021 Therapeutic Exercise: Nustep level 5 x 5 mins while gathering subjective Standing hip abduction 17.5# 2 x 10 L Standing hip extension 17.5# 2 x 10 L Hookying clamshell GTB 2 x 10 Bridge 2 x 10 Supine figure 4 piriformis stretch 2 x 30 sec BIL Supine hamstring stretch with strap 2 x 30" BIL Supine ITB stretch with strap 2 x 30" BIL Supine butterfly stretch 2 x 30" STS 2 x 10 Manual Therapy: Tiger tail and STM on L glute and ITB x 8 mins   [facility] Adult PT Treatment:                                                DATE: 11/28/2021 Therapeutic Exercise: Nustep level 4 x 5 mins while gathering subjective Standing hip abduction 17.5# 2 x 10 L Standing hip extension 17.5# 2 x 10 L Hookying clamshell RTB 2 x 10 Hoolying march RTB 2 x 10 BIL Hip adduction ball squeeze 5 sec hold 2 x 10 Bridge with ball squeeze x 10 Supine figure 4 piriformis stretch 2 x 30 sec BIL Supine hamstring stretch with strap 2 x 30" BIL Supine ITB stretch with strap 2 x 30" BIL Supine butterfly stretch 2 x 30" STS x  5 (7.2 sec) Manual Therapy: Tiger tail and STM on L glute and ITB x 8 mins   [facility] Adult PT Treatment:                                                DATE: 11/23/2021 11/23/21 HEP and Eval     PATIENT EDUCATION:  Education details: Assessed HEP response, Discussed eval findings, rehab rationale and POC and patient is in agreement  Person educated: Patient Education method: Explanation,  Demonstration, Verbal cues, and Handouts Education comprehension: verbalized understanding, returned demonstration, and needs further education     HOME EXERCISE PROGRAM: Access Code: [email or web address] URL: [email or web address] Date: 12/06/2021 Prepared by: Sharlynn Oliphant  Exercises Clamshell - 2 x daily - 7 x weekly - 2 sets - 15 reps Supine Figure 4 Piriformis Stretch - 2 x daily - 7 x weekly - 1 sets - 3 reps - 30s hold Curl Up with Arms Crossed - 2 x daily - 7 x weekly - 2 sets - 15 reps Supine Posterior Pelvic Tilt - 2 x daily - 7 x weekly - 2 sets - 15 reps      ASSESSMENT:   CLINICAL IMPRESSION: HEP reviewed and modified as noted to address underlying spondylolisthesis(per neurosurgeon notes).  Still point tender over piriformis L and less so over greater trochanter.  No symptoms with ITB stretch.       REHAB POTENTIAL: Good   CLINICAL DECISION MAKING: Stable/uncomplicated   EVALUATION COMPLEXITY: Low     GOALS: Goals reviewed with patient? Yes   SHORT TERM GOALS:   STG Name Target Date Goal status  1 Patient to demonstrate independence in HEP  Baseline: [id number] 12/07/2021 MET  2 Capture 5x STS Baseline: 7.2 sec 11/28/2021 12/09/2021 CAPTURED  LONG TERM GOALS:    LTG Name Target Date Goal status  1 Minimize L piriformis/PSIS tenderness Baseline:Moderate tenderness 12/23/2021 INITIAL  2 Ensure pelvic alignment is equal Baseline:TBD 12/23/2021 INITIAL  3 Increase L hip abduction strength to 4+5 Baseline: 4/5 L hip abduction strength 12/23/2021 INITIAL  PLAN: PT FREQUENCY: 2x/week   PT DURATION: 4 weeks   PLANNED INTERVENTIONS: Therapeutic exercises, Therapeutic activity, Neuro Muscular re-education, Balance training, Gait training, Patient/Family education, Joint mobilization, Stair training, Aquatic Therapy, and Manual therapy   PLAN FOR NEXT SESSION: L hip strengthening, stretching, stabilization, flexibility and core tasks, piriformis release, focus  on abduction strength and avoid excess lumbar extenison    Lanice Shirts, PT 12/06/2021, 12:19 PM

## 2021-12-07 ENCOUNTER — Ambulatory Visit: Payer: Medicare HMO

## 2021-12-07 ENCOUNTER — Other Ambulatory Visit: Payer: Self-pay

## 2021-12-07 DIAGNOSIS — M545 Low back pain, unspecified: Secondary | ICD-10-CM | POA: Diagnosis not present

## 2021-12-07 DIAGNOSIS — G8929 Other chronic pain: Secondary | ICD-10-CM | POA: Diagnosis not present

## 2021-12-07 DIAGNOSIS — M6281 Muscle weakness (generalized): Secondary | ICD-10-CM

## 2021-12-07 DIAGNOSIS — M25552 Pain in left hip: Secondary | ICD-10-CM

## 2021-12-07 NOTE — Therapy (Signed)
OUTPATIENT PHYSICAL THERAPY TREATMENT NOTE   Patient Name: Lisa Benjamin MRN: [id number] DOB:September 07, 1956, 66 y.o., female Today's Date: 12/07/2021  PCP: Harlan Stains, MD REFERRING PROVIDER: Harlan Stains, MD   PT End of Session - 12/07/21 1046     Visit Number 5    Number of Visits 8    Date for PT Re-Evaluation 12/23/21    Authorization Type Aetna [facility]    Authorization Time Period 11/23/21-12/30/21    PT Start Time 1045    PT Stop Time 1130    PT Time Calculation (min) 45 min    Activity Tolerance Patient tolerated treatment well    Behavior During Therapy [facility] for tasks assessed/performed               Past Medical History:  Diagnosis Date   Microscopic hematuria 11/04   negative work up with Dr. Risa Grill   Osteopenia    Vitamin D deficiency    Past Surgical History:  Procedure Laterality Date   CESAREAN SECTION     x2   ROTATOR CUFF REPAIR Left 12/2005   TUBAL LIGATION Bilateral 1991   There are no problems to display for this patient.   REFERRING DIAG: L hip pain  THERAPY DIAG:  Pain in left hip  Muscle weakness (generalized)  Chronic bilateral low back pain, unspecified whether sciatica present  PERTINENT HISTORY: [place] in October 2022, has had R and L hip pain since, received steroid injections which offered temporary relief.   PRECAUTIONS: None  ONSET DATE: October 2022  SUBJECTIVE: I've been having some soreness in my back and I got a muscle cramp in my inner thigh yesterday doing the other doctors exercises.  PAIN:  Are you having pain? No current NPRS scale: Current 0/10 Worst 3/10 Pain location: L hip/Greater trochanter Pain orientation: Left  PAIN TYPE: aching, burning, and throbbing Pain description: intermittent, burning, and tingling  Aggravating factors: standing and walking Relieving factors: sitting   OBJECTIVE:    DIAGNOSTIC FINDINGS: 1. Multilevel degenerative changes of the lumbar spine as described. Moderate to severe  spinal canal stenosis at L3-L4. 2. Small posterior and right lateral subdural fluid collection extending from the visualized lower thoracic spine to S1, suspicious for a dural defect. The site of possible defect is unclear. MRI of the thoracic spine is recommended to evaluate the cranial extent of the subdural fluid. Neurosurgical consultation is also recommended.   PATIENT SURVEYS:  FOTO 54   COGNITION:          Overall cognitive status: Within functional limits for tasks assessed                        SENSATION:          Light touch: Appears intact             MUSCLE LENGTH: Hamstrings: Right 80 deg; Left 80 deg   POSTURE:  WFL   PALPATION: TTP L PSIS/piriformis   LE AROM/PROM: WFL throughout   A/PROM Right 11/23/2021 Left 11/23/2021  Hip flexion      Hip extension      Hip abduction      Hip adduction      Hip internal rotation      Hip external rotation      Knee flexion      Knee extension      Ankle dorsiflexion      Ankle plantarflexion      Ankle inversion  Ankle eversion        LE MMT:   MMT Right 11/23/2021 Left 11/23/2021  Hip flexion      Hip extension      Hip abduction 5 4  Hip adduction      Hip internal rotation      Hip external rotation      Knee flexion      Knee extension      Ankle dorsiflexion      Ankle plantarflexion      Ankle inversion      Ankle eversion       (Blank rows = not tested)   LOWER EXTREMITY SPECIAL TESTS:  Hip special tests: Saralyn Pilar (FABER) test: negative, Trendelenburg test: negative, Piriformis test: positive , and stork test : negative    FUNCTIONAL TESTS:  5 times sit to stand: TBD 11/28/2021: 7.2 sec   GAIT: Distance walked: 123f Assistive device utilized: None Level of assistance: Complete Independence       TODAY'S TREATMENT: [facility] Adult PT Treatment:                                                DATE: 12/07/2021 Therapeutic Exercise: Nustep level 6 x 5 mins while gathering  subjective Standing hip abduction 17.5# 2 x 10 L Standing hip extension 17.5# 2 x 10 L Hookying clamshell BlueTB 2 x 10 Supine hip adduction ball squeeze 5" hold 2 x 10 Supine PPT x 15 Supine figure 4 piriformis stretch 2 x 30 sec BIL Supine hamstring stretch with strap 2 x 30" BIL Supine ITB stretch with strap 2 x 30" BIL Supine butterfly stretch 2 x 30" STS x 10 w/10# KB Manual Therapy: Tiger tail and STM on L glute and ITB in R SL x 8 mins   [facility] Adult PT Treatment:                                                DATE: 12/06/21 Therapeutic Exercise: Nustep level 4 x 8 mins  Curl ups 15x PPT 15x Supine march alt 15x each L clamshells in sidelie 30x to fatigue L ITB stretch in supine and sidelie w/o symptoms L abduction in sodelie with PT maintaining slight hip extension to isolate gluteus medius 30x Manual Therapy: STM to L piriformis in R SL 8 min   [facility] Adult PT Treatment:                                                DATE: 12/01/2021 Therapeutic Exercise: Nustep level 5 x 5 mins while gathering subjective Standing hip abduction 17.5# 2 x 10 L Standing hip extension 17.5# 2 x 10 L Hookying clamshell GTB 2 x 10 Bridge 2 x 10 Supine figure 4 piriformis stretch 2 x 30 sec BIL Supine hamstring stretch with strap 2 x 30" BIL Supine ITB stretch with strap 2 x 30" BIL Supine butterfly stretch 2 x 30" STS 2 x 10 Manual Therapy: Tiger tail and STM on L glute and ITB x 8 mins      PATIENT EDUCATION:  Education details:  Assessed HEP response, Discussed eval findings, rehab rationale and POC and patient is in agreement  Person educated: Patient Education method: Explanation, Demonstration, Verbal cues, and Handouts Education comprehension: verbalized understanding, returned demonstration, and needs further education     HOME EXERCISE PROGRAM: Access Code: [email or web address] URL: [email or web address] Date: 12/06/2021 Prepared by: Sharlynn Oliphant  Exercises Clamshell - 2 x daily - 7 x weekly - 2 sets - 15 reps Supine Figure 4 Piriformis Stretch - 2 x daily - 7 x weekly - 1 sets - 3 reps - 30s hold Curl Up with Arms Crossed - 2 x daily - 7 x weekly - 2 sets - 15 reps Supine Posterior Pelvic Tilt - 2 x daily - 7 x weekly - 2 sets - 15 reps      ASSESSMENT:   CLINICAL IMPRESSION:  Patient presents to PT with no pain, just muscle soreness in lower back and L hip area. She reports HEP compliance. She still exhibits tender points over L piriformis. Focus of session on bilateral LE and core strengthening and she was able to complete all prescribed exercises with no adverse effects. Patient continues to benefit from skilled PT services and should be progressed as able to improve functional independence.     REHAB POTENTIAL: Good   CLINICAL DECISION MAKING: Stable/uncomplicated   EVALUATION COMPLEXITY: Low     GOALS: Goals reviewed with patient? Yes   SHORT TERM GOALS:   STG Name Target Date Goal status  1 Patient to demonstrate independence in HEP  Baseline: [id number] 12/07/2021 MET  2 Capture 5x STS Baseline: 7.2 sec 11/28/2021 12/09/2021 CAPTURED  LONG TERM GOALS:    LTG Name Target Date Goal status  1 Minimize L piriformis/PSIS tenderness Baseline:Moderate tenderness 12/23/2021 INITIAL  2 Ensure pelvic alignment is equal Baseline:TBD 12/23/2021 INITIAL  3 Increase L hip abduction strength to 4+5 Baseline: 4/5 L hip abduction strength 12/23/2021 INITIAL  PLAN: PT FREQUENCY: 2x/week   PT DURATION: 4 weeks   PLANNED INTERVENTIONS: Therapeutic exercises, Therapeutic activity, Neuro Muscular re-education, Balance training, Gait training, Patient/Family education, Joint mobilization, Stair training, Aquatic Therapy, and Manual therapy   PLAN FOR NEXT SESSION: L hip strengthening, stretching, stabilization, flexibility and core tasks, piriformis release, focus on abduction strength and avoid excess lumbar  extenison    Evelene Croon, PTA 12/07/2021, 11:35 AM

## 2021-12-11 ENCOUNTER — Ambulatory Visit: Payer: Medicare HMO

## 2021-12-11 ENCOUNTER — Other Ambulatory Visit: Payer: Self-pay

## 2021-12-11 DIAGNOSIS — M25552 Pain in left hip: Secondary | ICD-10-CM | POA: Diagnosis not present

## 2021-12-11 DIAGNOSIS — M545 Low back pain, unspecified: Secondary | ICD-10-CM | POA: Diagnosis not present

## 2021-12-11 DIAGNOSIS — M6281 Muscle weakness (generalized): Secondary | ICD-10-CM

## 2021-12-11 DIAGNOSIS — G8929 Other chronic pain: Secondary | ICD-10-CM | POA: Diagnosis not present

## 2021-12-11 NOTE — Therapy (Addendum)
OUTPATIENT PHYSICAL THERAPY TREATMENT NOTE   Patient Name: Lisa Benjamin MRN: [id number] DOB:June 13, 1956, 66 y.o., female Today's Date: 12/11/2021  PCP: Harlan Stains, MD REFERRING PROVIDER: Harlan Stains, MD   PT End of Session - 12/11/21 1219     Visit Number 6    Number of Visits 8    Date for PT Re-Evaluation 12/23/21    Authorization Type Aetna [facility]    Authorization Time Period 11/23/21-12/30/21    Progress Note Due on Visit 8    PT Start Time 1220    PT Stop Time 1300    PT Time Calculation (min) 40 min    Activity Tolerance Patient tolerated treatment well    Behavior During Therapy [facility] for tasks assessed/performed               Past Medical History:  Diagnosis Date   Microscopic hematuria 11/04   negative work up with Dr. Risa Grill   Osteopenia    Vitamin D deficiency    Past Surgical History:  Procedure Laterality Date   CESAREAN SECTION     x2   ROTATOR CUFF REPAIR Left 12/2005   TUBAL LIGATION Bilateral 1991   There are no problems to display for this patient.   REFERRING DIAG: L hip pain  THERAPY DIAG:  Pain in left hip  Muscle weakness (generalized)  PERTINENT HISTORY: [place] in October 2022, has had R and L hip pain since, received steroid injections which offered temporary relief.   PRECAUTIONS: None  ONSET DATE: October 2022  SUBJECTIVE: Continues with lumbosacral discomfort, worst in AM and improves throughout the day.  Does not relate any particular exercise or activity that causes discomfort.  PAIN:  Are you having pain? No current NPRS scale: Current 0/10 Worst 3/10 Pain location: L hip/Greater trochanter Pain orientation: Left  PAIN TYPE: aching, burning, and throbbing Pain description: intermittent, burning, and tingling  Aggravating factors: standing and walking Relieving factors: sitting   OBJECTIVE:    DIAGNOSTIC FINDINGS: 1. Multilevel degenerative changes of the lumbar spine as described. Moderate to severe spinal  canal stenosis at L3-L4. 2. Small posterior and right lateral subdural fluid collection extending from the visualized lower thoracic spine to S1, suspicious for a dural defect. The site of possible defect is unclear. MRI of the thoracic spine is recommended to evaluate the cranial extent of the subdural fluid. Neurosurgical consultation is also recommended.   PATIENT SURVEYS:  FOTO 54; 12/11/21 72   COGNITION:          Overall cognitive status: Within functional limits for tasks assessed                        SENSATION:          Light touch: Appears intact             MUSCLE LENGTH: Hamstrings: Right 80 deg; Left 80 deg   POSTURE:  WFL   PALPATION: TTP L PSIS/piriformis   LE AROM/PROM: WFL throughout   A/PROM Right 11/23/2021 Left 11/23/2021  Hip flexion      Hip extension      Hip abduction      Hip adduction      Hip internal rotation      Hip external rotation      Knee flexion      Knee extension      Ankle dorsiflexion      Ankle plantarflexion      Ankle inversion  Ankle eversion        LE MMT:   MMT Right 11/23/2021 Left 11/23/2021  Hip flexion      Hip extension      Hip abduction 5 4  Hip adduction      Hip internal rotation      Hip external rotation      Knee flexion      Knee extension      Ankle dorsiflexion      Ankle plantarflexion      Ankle inversion      Ankle eversion       (Blank rows = not tested)   LOWER EXTREMITY SPECIAL TESTS:  Hip special tests: Saralyn Pilar (FABER) test: negative, Trendelenburg test: negative, Piriformis test: positive , and stork test : negative    FUNCTIONAL TESTS:  5 times sit to stand: TBD 11/28/2021: 7.2 sec   GAIT: Distance walked: 121ft Assistive device utilized: None Level of assistance: Complete Independence       TODAY'S TREATMENT: [facility] Adult PT Treatment:                                                DATE: 12/11/21 Therapeutic Exercise: Nustep level 4 x 8 mins  Curl ups 15x PPT  15x Supine march alt 15x each with PPT L clamshells in sidelie 30x to fatigue L hip flexor stretch in supine and sidelie w/o symptoms 30s hold L abduction in sidelie with PT maintaining slight hip extension to isolate gluteus medius 30x Glute sets with ball squeeze, 2s hold 15x Standing opposite hip/shoulder abduction 10x each STS from airex, arms crossed 10x Manual Therapy: STM to L piriformis in R SL 8 min  [facility] Adult PT Treatment:                                                DATE: 12/07/2021 Therapeutic Exercise: Nustep level 6 x 5 mins while gathering subjective Standing hip abduction 17.5# 2 x 10 L Standing hip extension 17.5# 2 x 10 L Hookying clamshell BlueTB 2 x 10 Supine hip adduction ball squeeze 5" hold 2 x 10 Supine PPT x 15 Supine figure 4 piriformis stretch 2 x 30 sec BIL Supine hamstring stretch with strap 2 x 30" BIL Supine ITB stretch with strap 2 x 30" BIL Supine butterfly stretch 2 x 30" STS x 10 w/10# KB Manual Therapy: Tiger tail and STM on L glute and ITB in R SL x 8 mins   [facility] Adult PT Treatment:                                                DATE: 12/06/21 Therapeutic Exercise: Nustep level 4 x 8 mins  Curl ups 15x PPT 15x Supine march alt 15x each L clamshells in sidelie 30x to fatigue L ITB stretch in supine and sidelie w/o symptoms L abduction in sidelie with PT maintaining slight hip extension to isolate gluteus medius 30x Manual Therapy: STM to L piriformis in R SL 8 min      PATIENT EDUCATION:  Education details: Assessed HEP response, Discussed  eval findings, rehab rationale and POC and patient is in agreement  Person educated: Patient Education method: Explanation, Demonstration, Verbal cues, and Handouts Education comprehension: verbalized understanding, returned demonstration, and needs further education     HOME EXERCISE PROGRAM: Access Code: [email or web address] URL: [email or web address] Date: 12/06/2021 Prepared by: Sharlynn Oliphant  Exercises Clamshell - 2 x daily - 7 x weekly - 2 sets - 15 reps Supine Figure 4 Piriformis Stretch - 2 x daily - 7 x weekly - 1 sets - 3 reps - 30s hold Curl Up with Arms Crossed - 2 x daily - 7 x weekly - 2 sets - 15 reps Supine Posterior Pelvic Tilt - 2 x daily - 7 x weekly - 2 sets - 15 reps      ASSESSMENT:   CLINICAL IMPRESSION: Awakes with soreness that improves with activity, still noted LLE radicular symptoms after 20 min walking suggesting soft tissue dysfunction vs structural.  Able to tolerate increased difficulty and challenge w/o increase symptoms, piriformis irritation less.  FOTO core increased form 54 to 72      REHAB POTENTIAL: Good   CLINICAL DECISION MAKING: Stable/uncomplicated   EVALUATION COMPLEXITY: Low     GOALS: Goals reviewed with patient? Yes   SHORT TERM GOALS:   STG Name Target Date Goal status  1 Patient to demonstrate independence in HEP  Baseline: [id number] 12/07/2021 MET  2 Capture 5x STS Baseline: 7.2 sec 11/28/2021 12/09/2021 CAPTURED  LONG TERM GOALS:    LTG Name Target Date Goal status  1 Minimize L piriformis/PSIS tenderness Baseline:Moderate tenderness 12/23/2021 INITIAL  2 Ensure pelvic alignment is equal Baseline:TBD 12/23/2021 INITIAL  3 Increase L hip abduction strength to 4+5 Baseline: 4/5 L hip abduction strength 12/23/2021 INITIAL  PLAN: PT FREQUENCY: 2x/week   PT DURATION: 4 weeks   PLANNED INTERVENTIONS: Therapeutic exercises, Therapeutic activity, Neuro Muscular re-education, Balance training, Gait training, Patient/Family education, Joint mobilization, Stair training, Aquatic Therapy, and Manual therapy   PLAN FOR NEXT SESSION: L hip strengthening, stretching, stabilization, flexibility and core tasks, piriformis release, focus on abduction strength and avoid excess lumbar extension, LS stabilization    Lanice Shirts, PT 12/11/2021, 12:19 PM

## 2021-12-15 ENCOUNTER — Ambulatory Visit: Payer: Medicare HMO | Attending: Sports Medicine

## 2021-12-15 ENCOUNTER — Other Ambulatory Visit: Payer: Self-pay

## 2021-12-15 DIAGNOSIS — M6281 Muscle weakness (generalized): Secondary | ICD-10-CM | POA: Insufficient documentation

## 2021-12-15 DIAGNOSIS — M25551 Pain in right hip: Secondary | ICD-10-CM | POA: Insufficient documentation

## 2021-12-15 DIAGNOSIS — G8929 Other chronic pain: Secondary | ICD-10-CM | POA: Diagnosis not present

## 2021-12-15 DIAGNOSIS — M545 Low back pain, unspecified: Secondary | ICD-10-CM | POA: Insufficient documentation

## 2021-12-15 DIAGNOSIS — M25552 Pain in left hip: Secondary | ICD-10-CM | POA: Diagnosis not present

## 2021-12-15 NOTE — Therapy (Signed)
OUTPATIENT PHYSICAL THERAPY TREATMENT NOTE   Patient Name: Lisa Benjamin MRN: [id number] DOB:September 13, 1956, 66 y.o., female Today's Date: 12/15/2021  PCP: Harlan Stains, MD REFERRING PROVIDER: Inez Catalina, MD   PT End of Session - 12/15/21 0900     Visit Number 7    Number of Visits 8    Date for PT Re-Evaluation 12/23/21    Authorization Type Aetna [facility]    Authorization Time Period 11/23/21-12/30/21    Progress Note Due on Visit 8    PT Start Time 0900    PT Stop Time 0944    PT Time Calculation (min) 44 min    Activity Tolerance Patient tolerated treatment well    Behavior During Therapy [facility] for tasks assessed/performed                Past Medical History:  Diagnosis Date   Microscopic hematuria 11/04   negative work up with Dr. Risa Grill   Osteopenia    Vitamin D deficiency    Past Surgical History:  Procedure Laterality Date   CESAREAN SECTION     x2   ROTATOR CUFF REPAIR Left 12/2005   TUBAL LIGATION Bilateral 1991   There are no problems to display for this patient.   REFERRING DIAG: L hip pain  THERAPY DIAG:  Pain in left hip  Muscle weakness (generalized)  Chronic bilateral low back pain, unspecified whether sciatica present  Pain in right hip  PERTINENT HISTORY: [facility] in October 2022, has had R and L hip pain since, received steroid injections which offered temporary relief.   PRECAUTIONS: None  ONSET DATE: October 2022  SUBJECTIVE: I'm not really having any pain lately, just muscle soreness.  PAIN:  Are you having pain? No current NPRS scale: Current 0/10 Worst 3/10 Pain location: L hip/Greater trochanter Pain orientation: Left  PAIN TYPE: aching, burning, and throbbing Pain description: intermittent, burning, and tingling  Aggravating factors: standing and walking Relieving factors: sitting   OBJECTIVE:    DIAGNOSTIC FINDINGS: 1. Multilevel degenerative changes of the lumbar spine as described. Moderate to severe spinal canal  stenosis at L3-L4. 2. Small posterior and right lateral subdural fluid collection extending from the visualized lower thoracic spine to S1, suspicious for a dural defect. The site of possible defect is unclear. MRI of the thoracic spine is recommended to evaluate the cranial extent of the subdural fluid. Neurosurgical consultation is also recommended.   PATIENT SURVEYS:  FOTO 54; 12/11/21 72   COGNITION:          Overall cognitive status: Within functional limits for tasks assessed                        SENSATION:          Light touch: Appears intact             MUSCLE LENGTH: Hamstrings: Right 80 deg; Left 80 deg   POSTURE:  WFL   PALPATION: TTP L PSIS/piriformis   LE AROM/PROM: WFL throughout   A/PROM Right 11/23/2021 Left 11/23/2021  Hip flexion      Hip extension      Hip abduction      Hip adduction      Hip internal rotation      Hip external rotation      Knee flexion      Knee extension      Ankle dorsiflexion      Ankle plantarflexion      Ankle inversion  Ankle eversion        LE MMT:   MMT Right 11/23/2021 Left 11/23/2021  Hip flexion      Hip extension      Hip abduction 5 4  Hip adduction      Hip internal rotation      Hip external rotation      Knee flexion      Knee extension      Ankle dorsiflexion      Ankle plantarflexion      Ankle inversion      Ankle eversion       (Blank rows = not tested)   LOWER EXTREMITY SPECIAL TESTS:  Hip special tests: Saralyn Pilar (FABER) test: negative, Trendelenburg test: negative, Piriformis test: positive , and stork test : negative    FUNCTIONAL TESTS:  5 times sit to stand: TBD 11/28/2021: 7.2 sec   GAIT: Distance walked: 116f Assistive device utilized: None Level of assistance: Complete Independence       TODAY'S TREATMENT: [facility] Adult PT Treatment:                                                DATE: 12/15/2021 Therapeutic Exercise: Nustep level 6 x 5 mins  Cybex hip abduction 17.5# 2 x 10  L Cybex hip extension 17.5# 2 x 10 L PPT 2 x 10 Hookyling clamshell BTB 2 x 10 L abduction in sidelie with PT maintaining slight hip extension to isolate gluteus medius 30x Glute sets with ball squeeze, 2s hold 2 x 10 Supine figure 4 piriformis stretch 2 x 30 sec BIL Supine hamstring stretch with strap 2 x 30" BIL Supine ITB stretch with strap 2 x 30" BIL Supine butterfly stretch 2 x 30" Modified thomas stretch 2 x 30" BIL with strap STS 2 x 10 w/10# KB Manual Therapy: Tiger tail and STM on L glute, piriformis, ITB in R SL x 10 min   [facility] Adult PT Treatment:                                                DATE: 12/11/21 Therapeutic Exercise: Nustep level 4 x 8 mins  Curl ups 15x PPT 15x Supine march alt 15x each with PPT L clamshells in sidelie 30x to fatigue L hip flexor stretch in supine and sidelie w/o symptoms 30s hold L abduction in sidelie with PT maintaining slight hip extension to isolate gluteus medius 30x Glute sets with ball squeeze, 2s hold 15x Standing opposite hip/shoulder abduction 10x each STS from airex, arms crossed 10x Manual Therapy: STM to L piriformis in R SL 8 min  [facility] Adult PT Treatment:                                                DATE: 12/07/2021 Therapeutic Exercise: Nustep level 6 x 5 mins while gathering subjective Standing hip abduction 17.5# 2 x 10 L Standing hip extension 17.5# 2 x 10 L Hookying clamshell BlueTB 2 x 10 Supine hip adduction ball squeeze 5" hold 2 x 10 Supine PPT x 15 Supine figure 4 piriformis stretch  2 x 30 sec BIL Supine hamstring stretch with strap 2 x 30" BIL Supine ITB stretch with strap 2 x 30" BIL Supine butterfly stretch 2 x 30" STS x 10 w/10# KB Manual Therapy: Tiger tail and STM on L glute and ITB in R SL x 8 mins      PATIENT EDUCATION:  Education details: Assessed HEP response, Discussed eval findings, rehab rationale and POC and patient is in agreement  Person educated: Patient Education method:  Explanation, Demonstration, Verbal cues, and Handouts Education comprehension: verbalized understanding, returned demonstration, and needs further education     HOME EXERCISE PROGRAM: Access Code: [email or web address] URL: [email or web address] Date: 12/06/2021 Prepared by: Sharlynn Oliphant  Exercises Clamshell - 2 x daily - 7 x weekly - 2 sets - 15 reps Supine Figure 4 Piriformis Stretch - 2 x daily - 7 x weekly - 1 sets - 3 reps - 30s hold Curl Up with Arms Crossed - 2 x daily - 7 x weekly - 2 sets - 15 reps Supine Posterior Pelvic Tilt - 2 x daily - 7 x weekly - 2 sets - 15 reps      ASSESSMENT:   CLINICAL IMPRESSION:  Patient presents to PT with no current pain and reports only mild soreness in her L hip when she stands for prolonged periods. She was able to complete all prescribed exercises with no adverse effects. Today's session focused on LE and core strengthening. Patient continues to benefit from skilled PT services and should be progressed as able to improve functional independence.      REHAB POTENTIAL: Good   CLINICAL DECISION MAKING: Stable/uncomplicated   EVALUATION COMPLEXITY: Low     GOALS: Goals reviewed with patient? Yes   SHORT TERM GOALS:   STG Name Target Date Goal status  1 Patient to demonstrate independence in HEP  Baseline: [id number] 12/07/2021 MET  2 Capture 5x STS Baseline: 7.2 sec 11/28/2021 12/09/2021 CAPTURED  LONG TERM GOALS:    LTG Name Target Date Goal status  1 Minimize L piriformis/PSIS tenderness Baseline:Moderate tenderness 12/23/2021 INITIAL  2 Ensure pelvic alignment is equal Baseline:TBD 12/23/2021 INITIAL  3 Increase L hip abduction strength to 4+5 Baseline: 4/5 L hip abduction strength 12/23/2021 INITIAL  PLAN: PT FREQUENCY: 2x/week   PT DURATION: 4 weeks   PLANNED INTERVENTIONS: Therapeutic exercises, Therapeutic activity, Neuro Muscular re-education, Balance training, Gait training, Patient/Family education, Joint  mobilization, Stair training, Aquatic Therapy, and Manual therapy   PLAN FOR NEXT SESSION: L hip strengthening, stretching, stabilization, flexibility and core tasks, piriformis release, focus on abduction strength and avoid excess lumbar extension, LS stabilization    Evelene Croon, PTA 12/15/2021, 9:48 AM

## 2021-12-20 ENCOUNTER — Ambulatory Visit: Payer: Medicare HMO

## 2021-12-20 ENCOUNTER — Other Ambulatory Visit: Payer: Self-pay

## 2021-12-20 DIAGNOSIS — M545 Low back pain, unspecified: Secondary | ICD-10-CM | POA: Diagnosis not present

## 2021-12-20 DIAGNOSIS — G8929 Other chronic pain: Secondary | ICD-10-CM | POA: Diagnosis not present

## 2021-12-20 DIAGNOSIS — M6281 Muscle weakness (generalized): Secondary | ICD-10-CM | POA: Diagnosis not present

## 2021-12-20 DIAGNOSIS — M25551 Pain in right hip: Secondary | ICD-10-CM | POA: Diagnosis not present

## 2021-12-20 DIAGNOSIS — M25552 Pain in left hip: Secondary | ICD-10-CM

## 2021-12-20 NOTE — Therapy (Signed)
?OUTPATIENT PHYSICAL THERAPY TREATMENT NOTE ? ? ?Patient Name: Lisa Benjamin ?MRN: [id number] ?DOB:October 30, 1955, 66 y.o., female ?Today's Date: 12/20/2021 ? ?PCP: Harlan Stains, MD ?REFERRING PROVIDER: Inez Catalina, MD ? ? PT End of Session - 12/20/21 1318   ? ? Visit Number 8   ? Number of Visits 9   ? Date for PT Re-Evaluation 12/23/21   ? Authorization Type [employer]na [facility]   ? Authorization Time Period 11/23/21-12/30/21   ? Progress Note Due on Visit 9   ? PT Start Time 1320   ? PT Stop Time 1400   ? PT Time Calculation (min) 40 min   ? Activity Tolerance Patient tolerated treatment well   ? Behavior During Therapy [facility] for tasks assessed/performed   ? ?  ?  ? ?  ? ? ? ? ? ?Past Medical History:  ?Diagnosis Date  ? Microscopic hematuria 11/04  ? negative work up with Dr. Risa Grill  ? Osteopenia   ? Vitamin D deficiency   ? ?Past Surgical History:  ?Procedure Laterality Date  ? CESAREAN SECTION    ? x2  ? ROTATOR CUFF REPAIR Left 12/2005  ? TUBAL LIGATION Bilateral 1991  ? ?There are no problems to display for this patient. ? ? ?REFERRING DIAG: L hip pain ? ?THERAPY DIAG:  ?Pain in left hip ? ?Muscle weakness (generalized) ? ?PERTINENT HISTORY: [facility] in October 2022, has had R and L hip pain since, received steroid injections which offered temporary relief.  ? ?PRECAUTIONS: None ? ?ONSET DATE: October 2022 ? ?SUBJECTIVE: No L hip pain reported today.  Has been able to push a stroller w/o any symptom aggravation  ? ?PAIN:  ?Are you having pain? No current ?NPRS scale: Current 0/10 ?Pain location: L hip/Greater trochanter ?Pain orientation: Left  ?PAIN TYPE: aching, burning, and throbbing ?Pain description: intermittent, burning, and tingling  ?Aggravating factors: standing and walking ?Relieving factors: sitting ? ? ?OBJECTIVE:  ?  ?DIAGNOSTIC FINDINGS: 1. Multilevel degenerative changes of the lumbar spine as described. ?Moderate to severe spinal canal stenosis at L3-L4. ?2. Small posterior and right lateral subdural fluid  collection ?extending from the visualized lower thoracic spine to S1, suspicious ?for a dural defect. The site of possible defect is unclear. MRI of ?the thoracic spine is recommended to evaluate the cranial extent of ?the subdural fluid. Neurosurgical consultation is also recommended. ?  ?PATIENT SURVEYS:  ?FOTO 54; 12/11/21 72 ?  ?COGNITION: ?         Overall cognitive status: Within functional limits for tasks assessed              ?          ?SENSATION: ?         Light touch: Appears intact ?          ?  ?MUSCLE LENGTH: ?Hamstrings: Right 80 deg; Left 80 deg ?  ?POSTURE:  ?WFL ?  ?PALPATION: ?TTP L PSIS/piriformis ?  ?LE AROM/PROM: WFL throughout ?  ?A/PROM Right ?11/23/2021 Left ?11/23/2021  ?Hip flexion      ?Hip extension      ?Hip abduction      ?Hip adduction      ?Hip internal rotation      ?Hip external rotation      ?Knee flexion      ?Knee extension      ?Ankle dorsiflexion      ?Ankle plantarflexion      ?Ankle inversion      ?Ankle eversion      ?  ?  LE MMT: ?  ?MMT Right ?11/23/2021 Left ?11/23/2021  ?Hip flexion      ?Hip extension      ?Hip abduction 5 4  ?Hip adduction      ?Hip internal rotation      ?Hip external rotation      ?Knee flexion      ?Knee extension      ?Ankle dorsiflexion      ?Ankle plantarflexion      ?Ankle inversion      ?Ankle eversion      ? (Blank rows = not tested) ?  ?LOWER EXTREMITY SPECIAL TESTS:  ?Hip special tests: Saralyn Pilar (FABER) test: negative, Trendelenburg test: negative, Piriformis test: positive , and stork test : negative  ?  ?FUNCTIONAL TESTS:  ?5 times sit to stand: TBD ?11/28/2021: 7.2 sec ?  ?GAIT: ?Distance walked: 181f ?Assistive device utilized: None ?Level of assistance: Complete Independence ?  ?  ?  ?[facility]                                                [facility] ?Therapeutic Exercise: ?Nustep level 4 x 8 mins  ?Curl ups 15x2 ?Supine DKTC with ball squeeze ?L clamshells in sidelie 30x to fatigue BTB ?L hip flexor  stretch in supine and sidelie w/o symptoms 30s hold ?L abduction in sidelie with PT maintaining slight hip extension to isolate gluteus medius 30x ?STS from airex, arms crossed 10x 10# KB ?Supine figure 4 piriformis stretch 2 x 30 sec BIL ?Supine hamstring stretch with strap 1 x 30" BIL ?Supine ITB stretch with strap 1 x 30" BIL ?Supine butterfly stretch 1 x 30" ? [facility]                                                [facility]: 12[facility]15[facility]2021 ?Therapeutic Exercise: ?Nustep level 6 x 5 mins  ?Cybex hip abduction 17.5# 2 x 10 L ?Cybex hip extension 17.5# 2 x 10 L ?PPT 2 x 10 ?Hookyling clamshell BTB 2 x 10 ?L abduction in sidelie with PT maintaining slight hip extension to isolate gluteus medius 30x ?Glute sets with ball squeeze, 2s hold 2 x 10 ?Supine figure 4 piriformis stretch 2 x 30 sec BIL ?Supine hamstring stretch with strap 2 x 30" BIL ?Supine ITB stretch with strap 2 x 30" BIL ?Supine butterfly stretch 2 x 30" ?Modified thomas stretch 2 x 30" BIL with strap ?STS 2 x 10 w/10# KB ?Manual Therapy: ?Tiger tail and STM on L glute, piriformis, ITB in R SL x 10 min ? ? [facility]                                                DATE: 12[facility]11[facility]21 ?Therapeutic Exercise: ?Nustep level 4 x 8 mins  ?Curl ups 15x ?PPT 15x ?Supine march alt 15x each with PPT ?L clamshells in sidelie 30x to fatigue ?L hip flexor stretch in supine and sidelie w/o symptoms 30s hold ?L abduction in sidelie with PT maintaining slight hip extension to isolate gluteus  medius 30x ?Glute sets with ball squeeze, 2s hold 15x ?Standing opposite hip/shoulder abduction 10x each ?STS from airex, arms crossed 10x ?Manual Therapy: ?STM to L piriformis in R SL 8 min ? ? ?  ?  ?PATIENT EDUCATION:  ?Education details: Assessed HEP response, Discussed eval findings, rehab rationale and POC and patient is in agreement  ?Person educated: Patient ?Education method: Explanation, Demonstration, Verbal cues, and Handouts ?Education comprehension:  verbalized understanding, returned demonstration, and needs further education ?  ?  ?HOME EXERCISE PROGRAM: ?Access Code: [email or web address] ?URL: [email or web address] ?Date: 12/06/2021 ?Prepared by: Sharlynn Oliphant ? ?Exercises ?Clamshell - 2 x daily - 7 x weekly - 2 sets - 15 reps ?Supine Figure 4 Piriformis Stretch - 2 x daily - 7 x weekly - 1 sets - 3 reps - 30s hold ?Curl Up with Arms Crossed - 2 x daily - 7 x weekly - 2 sets - 15 reps ?Supine Posterior Pelvic Tilt - 2 x daily - 7 x weekly - 2 sets - 15 reps ? ?  ?  ?ASSESSMENT: ?  ?CLINICAL IMPRESSION: Patient reporting little to no pain or dysfunction in low back.  Still notes a lumbosacral discomfort when standing for prolonged periods or having to walk long distances.  Held manual interventions due to pending DC.  Feels she is doing well and receptive to DC to HEP at next session. ?  ?REHAB POTENTIAL: Good ?  ?CLINICAL DECISION MAKING: Stable/uncomplicated ?  ?EVALUATION COMPLEXITY: Low ?  ?  ?GOALS: ?Goals reviewed with patient? Yes ?  ?SHORT TERM GOALS: ?  ?STG Name Target Date Goal status  ?1 Patient to demonstrate independence in HEP  ?Baseline: [id number] 12/07/2021 MET  ?2 Capture 5x STS ?Baseline: 7.2 sec 11/28/2021 12/09/2021 CAPTURED  ?LONG TERM GOALS:  ?  ?LTG Name Target Date Goal status  ?1 Minimize L piriformis/PSIS tenderness ?Baseline:Moderate tenderness 12/23/2021 INITIAL  ?2 Ensure pelvic alignment is equal ?Baseline:TBD 12/23/2021 INITIAL  ?3 Increase L hip abduction strength to 4+5 ?Baseline: 4/5 L hip abduction strength 12/23/2021 INITIAL  ?PLAN: ?PT FREQUENCY: 2x/week ?  ?PT DURATION: 4 weeks ?  ?PLANNED INTERVENTIONS: Therapeutic exercises, Therapeutic activity, Neuro Muscular re-education, Balance training, Gait training, Patient/Family education, Joint mobilization, Stair training, Aquatic Therapy, and Manual therapy ?  ?PLAN FOR NEXT SESSION: Assess goal progress for pending DC ? ? ? ?Lanice Shirts, PT ?12/20/2021, 3:49 PM ?  ? ?

## 2021-12-21 ENCOUNTER — Ambulatory Visit: Payer: Medicare HMO

## 2021-12-21 DIAGNOSIS — M25552 Pain in left hip: Secondary | ICD-10-CM

## 2021-12-21 DIAGNOSIS — M25551 Pain in right hip: Secondary | ICD-10-CM | POA: Diagnosis not present

## 2021-12-21 DIAGNOSIS — G8929 Other chronic pain: Secondary | ICD-10-CM | POA: Diagnosis not present

## 2021-12-21 DIAGNOSIS — M6281 Muscle weakness (generalized): Secondary | ICD-10-CM | POA: Diagnosis not present

## 2021-12-21 DIAGNOSIS — M545 Low back pain, unspecified: Secondary | ICD-10-CM | POA: Diagnosis not present

## 2021-12-21 NOTE — Therapy (Signed)
OUTPATIENT PHYSICAL THERAPY TREATMENT NOTE   Patient Name: Lisa Benjamin MRN: [id number] DOB:03/11/1956, 66 y.o., female Today's Date: 12/21/2021  PCP: Harlan Stains, MD REFERRING PROVIDER: Harlan Stains, MD PHYSICAL THERAPY DISCHARGE SUMMARY  Visits from Start of Care: 9  Current functional level related to goals / functional outcomes: Goals met   Remaining deficits: None noted   Education / Equipment: HEP   Patient agrees to discharge. Patient goals were met. Patient is being discharged due to being pleased with the current functional level.   PT End of Session - 12/21/21 1000     Visit Number 9    Number of Visits 9    Date for PT Re-Evaluation 12/23/21    Authorization Type Aetna [facility]    Authorization Time Period 11/23/21-12/30/21    Progress Note Due on Visit 9    PT Start Time 0920    PT Stop Time 1000    PT Time Calculation (min) 40 min    Activity Tolerance Patient tolerated treatment well    Behavior During Therapy [facility] for tasks assessed/performed                 Past Medical History:  Diagnosis Date   Microscopic hematuria 11/04   negative work up with Dr. Risa Grill   Osteopenia    Vitamin D deficiency    Past Surgical History:  Procedure Laterality Date   CESAREAN SECTION     x2   ROTATOR CUFF REPAIR Left 12/2005   TUBAL LIGATION Bilateral 1991   There are no problems to display for this patient.   REFERRING DIAG: L hip pain  THERAPY DIAG:  Pain in left hip  Muscle weakness (generalized)  PERTINENT HISTORY: [facility] in October 2022, has had R and L hip pain since, received steroid injections which offered temporary relief.   PRECAUTIONS: None  ONSET DATE: October 2022  SUBJECTIVE: No L hip pain reported today.  Her only limitations are sweeping and vacuuming which do not bother her L hip, just her low back  PAIN:  Are you having pain? No current NPRS scale: Current 0/10 Pain location: L hip/Greater trochanter Pain orientation:  Left  PAIN TYPE: aching, burning, and throbbing Pain description: intermittent, burning, and tingling  Aggravating factors: standing and walking Relieving factors: sitting   OBJECTIVE:    DIAGNOSTIC FINDINGS: 1. Multilevel degenerative changes of the lumbar spine as described. Moderate to severe spinal canal stenosis at L3-L4. 2. Small posterior and right lateral subdural fluid collection extending from the visualized lower thoracic spine to S1, suspicious for a dural defect. The site of possible defect is unclear. MRI of the thoracic spine is recommended to evaluate the cranial extent of the subdural fluid. Neurosurgical consultation is also recommended.   PATIENT SURVEYS:  FOTO 54; 12/11/21 72; 12/21/21 84 out of predicted 65   COGNITION:          Overall cognitive status: Within functional limits for tasks assessed                        SENSATION:          Light touch: Appears intact             MUSCLE LENGTH: Hamstrings: Right 80 deg; Left 80 deg   POSTURE:  WFL   PALPATION: TTP L PSIS/piriformis   LE AROM/PROM: WFL throughout   A/PROM Right 11/23/2021 Left 11/23/2021  Hip flexion      Hip extension  Hip abduction      Hip adduction      Hip internal rotation      Hip external rotation      Knee flexion      Knee extension      Ankle dorsiflexion      Ankle plantarflexion      Ankle inversion      Ankle eversion        LE MMT:   MMT Right 11/23/2021 Left 12/21/2021  Hip flexion      Hip extension      Hip abduction 5 4+  Hip adduction      Hip internal rotation      Hip external rotation      Knee flexion      Knee extension      Ankle dorsiflexion      Ankle plantarflexion      Ankle inversion      Ankle eversion       (Blank rows = not tested)   LOWER EXTREMITY SPECIAL TESTS:  Hip special tests: Saralyn Pilar (FABER) test: negative, Trendelenburg test: negative, Piriformis test: positive , and stork test : negative    FUNCTIONAL TESTS:  5  times sit to stand: TBD 11/28/2021: 7.2 sec   GAIT: Distance walked: 156f Assistive device utilized: None Level of assistance: Complete Independence       TODAY'S TREATMENT:  [facility] Adult PT Treatment:                                                DATE: 12/21/21 Therapeutic Exercise: Nustep level 4 x 8 mins  Curl ups 15x2 L clamshells in sidelie 30x to fatigue RTB Supine alt marches with PPT 15/15 L abduction in sidelie with PT maintaining slight hip extension to isolate gluteus medius 30x STS from airex, arms crossed 10x 10# KB Supine figure 4 piriformis stretch 3 x 30 sec L   [facility] Adult PT Treatment:                                                DATE: 12/20/21 Therapeutic Exercise: Nustep level 4 x 8 mins  Curl ups 15x2 Supine DKTC with ball squeeze L clamshells in sidelie 30x to fatigue BTB L hip flexor stretch in supine and sidelie w/o symptoms 30s hold L abduction in sidelie with PT maintaining slight hip extension to isolate gluteus medius 30x STS from airex, arms crossed 10x 10# KB Supine figure 4 piriformis stretch 2 x 30 sec BIL Supine hamstring stretch with strap 1 x 30" BIL Supine ITB stretch with strap 1 x 30" BIL Supine butterfly stretch 1 x 30"  [facility] Adult PT Treatment:                                                DATE: 12/15/2021 Therapeutic Exercise: Nustep level 6 x 5 mins  Cybex hip abduction 17.5# 2 x 10 L Cybex hip extension 17.5# 2 x 10 L PPT 2 x 10 Hookyling clamshell BTB 2 x 10 L abduction in sidelie with PT maintaining slight hip extension  to isolate gluteus medius 30x Glute sets with ball squeeze, 2s hold 2 x 10 Supine figure 4 piriformis stretch 2 x 30 sec BIL Supine hamstring stretch with strap 2 x 30" BIL Supine ITB stretch with strap 2 x 30" BIL Supine butterfly stretch 2 x 30" Modified thomas stretch 2 x 30" BIL with strap STS 2 x 10 w/10# KB Manual Therapy: Tiger tail and STM on L glute, piriformis, ITB in R SL x 10 min        PATIENT EDUCATION:  Education details: Assessed HEP response, Discussed eval findings, rehab rationale and POC and patient is in agreement  Person educated: Patient Education method: Explanation, Demonstration, Verbal cues, and Handouts Education comprehension: verbalized understanding, returned demonstration, and needs further education     HOME EXERCISE PROGRAM: Access Code: [email or web address] URL: [email or web address] Date: 12/21/2021 Prepared by: Sharlynn Oliphant  Exercises Clamshell - 2 x daily - 7 x weekly - 2 sets - 15 reps Supine Figure 4 Piriformis Stretch - 2 x daily - 7 x weekly - 1 sets - 3 reps - 30s hold Curl Up with Arms Crossed - 2 x daily - 7 x weekly - 2 sets - 15 reps Supine Posterior Pelvic Tilt - 2 x daily - 7 x weekly - 2 sets - 15 reps Sidelying Open Book Thoracic Lumbar Rotation and Extension - 2 x daily - 7 x weekly - 3 sets - 10 reps - 30s hold      ASSESSMENT:   CLINICAL IMPRESSION: rehab goals met, L hip pain and function greatly improved, HEP finalized and reviewed   REHAB POTENTIAL: Good   CLINICAL DECISION MAKING: Stable/uncomplicated   EVALUATION COMPLEXITY: Low     GOALS: Goals reviewed with patient? Yes   SHORT TERM GOALS:   STG Name Target Date Goal status  1 Patient to demonstrate independence in HEP  Baseline: [id number] 12/07/2021 MET  2 Capture 5x STS Baseline: 7.2 sec 11/28/2021 12/09/2021 Met  LONG TERM GOALS:    LTG Name Target Date Goal status  1 Minimize L piriformis/PSIS tenderness Baseline:Moderate tenderness; 01/10/22 minimal to no tenderness 12/23/2021 Met  2 Ensure pelvic alignment is equal Baseline:TBD; 12/21/21 Pelvic alignment equal in static and dynamic positions 12/23/2021 Met  3 Increase L hip abduction strength to 4+5 Baseline: 4/5 L hip abduction strength 12/23/2021 Met  PLAN: PT FREQUENCY: 2x/week   PT DURATION: 4 weeks   PLANNED INTERVENTIONS: Therapeutic exercises, Therapeutic activity, Neuro Muscular  re-education, Balance training, Gait training, Patient/Family education, Joint mobilization, Stair training, Aquatic Therapy, and Manual therapy   PLAN FOR NEXT SESSION:  DC    Lanice Shirts, PT 12/21/2021, 10:01 AM

## 2022-01-23 DIAGNOSIS — M4316 Spondylolisthesis, lumbar region: Secondary | ICD-10-CM | POA: Diagnosis not present

## 2022-01-23 DIAGNOSIS — Z6832 Body mass index (BMI) 32.0-32.9, adult: Secondary | ICD-10-CM | POA: Diagnosis not present

## 2022-01-24 DIAGNOSIS — J45901 Unspecified asthma with (acute) exacerbation: Secondary | ICD-10-CM | POA: Diagnosis not present

## 2022-01-24 DIAGNOSIS — J019 Acute sinusitis, unspecified: Secondary | ICD-10-CM | POA: Diagnosis not present

## 2022-02-20 ENCOUNTER — Ambulatory Visit (INDEPENDENT_AMBULATORY_CARE_PROVIDER_SITE_OTHER): Payer: Medicare HMO | Admitting: Radiology

## 2022-02-20 ENCOUNTER — Encounter: Payer: Self-pay | Admitting: Radiology

## 2022-02-20 VITALS — Ht 62.25 in | Wt 179.0 lb

## 2022-02-20 DIAGNOSIS — Z01419 Encounter for gynecological examination (general) (routine) without abnormal findings: Secondary | ICD-10-CM | POA: Diagnosis not present

## 2022-02-20 NOTE — Progress Notes (Signed)
? ?  Lisa Benjamin 1956-08-04 [id number] ? ? ?History: Postmenopausal 66 y.o. presents for breast and pelvic. No gyn concerns ? ? ?Gynecologic History ?Postmenopausal ?Last Pap: 11/02/18. Results were: normal ?Last mammogram: 7/22. Results were: normal ?Last colonoscopy: 2017 ?HRT use: none ?Sexually active: yes, no pain ? ?Obstetric History ?OB History  ?Gravida Para Term Preterm AB Living  ?2 2 2  0 0 2  ?SAB IAB Ectopic Multiple Live Births  ?0 0 0 0 2  ?  ?# Outcome Date GA Lbr Len/2nd Weight Sex Delivery Anes PTL Lv  ?2 Term 21    M CS-Classical   LIV  ?1 Term 8    F CS-Classical   LIV  ? ? ? ?The following portions of the patient's history were reviewed and updated as appropriate: allergies, current medications, past family history, past medical history, past social history, past surgical history, and problem list. ? ?Review of Systems ?Pertinent items noted in HPI and remainder of comprehensive ROS otherwise negative.  ?Past medical history, past surgical history, family history and social history were all reviewed and documented in the EPIC chart. ? ?Exam: ? ?Vitals:  ? 02/20/22 0901  ?Weight: 179 lb (81.2 kg)  ?Height: 5' 2.25" (1.581 m)  ? ?Body mass index is 32.48 kg/m?. ? ?General appearance:  Normal ?Thyroid:  Symmetrical, normal in size, without palpable masses or nodularity. ?Respiratory ? Auscultation:  Clear without wheezing or rhonchi ?Cardiovascular ? Auscultation:  Regular rate, without rubs, murmurs or gallops ? Edema/varicosities:  Not grossly evident ?Abdominal ? Soft,nontender, without masses, guarding or rebound. ? Liver/spleen:  No organomegaly noted ? Hernia:  None appreciated ? Skin ? Inspection:  Grossly normal ?Breasts: Examined lying and sitting.  ? Right: Without masses, retractions, nipple discharge or axillary adenopathy. ? ? Left: Without masses, retractions, nipple discharge or axillary adenopathy. ?Genitourinary  ? Inguinal/mons:  Normal without inguinal adenopathy ? External  genitalia:  Normal appearing vulva with no masses, tenderness, or lesions ? BUS/Urethra/Skene's glands:  Normal ? Vagina:  Normal appearing with normal color and discharge, no lesions. Atrophy: mild  ? Cervix:  Normal appearing without discharge or lesions ? Uterus:  Normal in size, shape and contour.  Midline and mobile, nontender ? Adnexa/parametria:   ?  Rt: Normal in size, without masses or tenderness. ?  Lt: Normal in size, without masses or tenderness. ? Anus and perineum: Normal ?  ? ?Patient informed chaperone available to be present for breast and pelvic exam. Patient has requested no chaperone to be present. Patient has been advised what will be completed during breast and pelvic exam.  ? ?Assessment/Plan:   ? ?1. Well woman exam with routine gynecological exam ?Paps d/c'd ?F/u 2 years for breast and pelvic ? ?Discussed SBE, colonoscopy and DEXA screening as directed. Recommend 04/22/22 of exercise weekly, including weight bearing exercise. Encouraged the use of seatbelts and sunscreen.  ?Return in 1 year for annual or sooner prn. ? ? B WHNP-BC, 9:12 AM 02/20/2022  ?

## 2022-03-12 DIAGNOSIS — R252 Cramp and spasm: Secondary | ICD-10-CM | POA: Diagnosis not present

## 2022-03-12 DIAGNOSIS — I872 Venous insufficiency (chronic) (peripheral): Secondary | ICD-10-CM | POA: Diagnosis not present

## 2022-03-12 DIAGNOSIS — I83813 Varicose veins of bilateral lower extremities with pain: Secondary | ICD-10-CM | POA: Diagnosis not present

## 2022-03-23 NOTE — Progress Notes (Unsigned)
Cardiology Office Note   Date:  03/23/2022   ID:  Lisa Benjamin, DOB 27-Jan-1956, MRN [id number]  PCP:  Harlan Stains, MD  Cardiologist:   Lakayla Barrington [place], MD   No chief complaint on file.     History of Present Illness: Lisa Benjamin is a 66 y.o. female who is seen for follow up of palpitations.  Last seen in April 2021. She is a Marine scientist at [employer]. She was seen in the past for a new RBBB.  She reports a history of a "functional" murmur in the past. She had an Echo in 1998 that was normal except for mild TR. She had an Ecg that showed a Junctional rhythm. She was asymptomatic. A Holter monitor was done showing no Junctional rhythm. Very rare PACs and PVCs. No further work up needed.   She was seen in the ED on 01/18/20 with palpitations. Ecg showed NSR with PVCs. RBBB. Creatinine was 1.21. she was given Toprol XL 25 mg once. On follow up  she denied any further symptoms.  Echo showed mild LVH otherwise normal.   She is now retired. Was seen at Urgent care yesterday with an asthma exacerbation. Given albuterol and steroid taper. She has very rare palpitations. Under a lot of stress  recently with husband being ill.     Past Medical History:  Diagnosis Date   Microscopic hematuria 11/04   negative work up with Dr. Risa Grill   Osteopenia    Vitamin D deficiency     Past Surgical History:  Procedure Laterality Date   CESAREAN SECTION     x2   ROTATOR CUFF REPAIR Left 12/2005   TUBAL LIGATION Bilateral 1991     Current Outpatient Medications  Medication Sig Dispense Refill   Acetaminophen (TYLENOL) 325 MG CAPS 1 capsule as needed     albuterol (VENTOLIN HFA) 108 (90 Base) MCG/ACT inhaler Inhale 1-2 puffs into the lungs every 6 (six) hours as needed for wheezing or shortness of breath. 18 g 0   calcium carbonate (TUMS - DOSED IN MG ELEMENTAL CALCIUM) 500 MG chewable tablet Chew 2 tablets by mouth daily.      MELATONIN PO Take by mouth.     Multiple Vitamin (MULTIVITAMIN)  tablet Take 1 tablet by mouth daily.     Spacer/Aero-Holding Chambers (AEROCHAMBER MV) inhaler Use as instructed 1 each 0   VITAMIN D PO Take 2,000 Units by mouth daily.      No current facility-administered medications for this visit.    Allergies:   Patient has no known allergies.    Social History:  The patient  reports that she has never smoked. She has never used smokeless tobacco. She reports current alcohol use. She reports that she does not use drugs.   Family History:  The patient's family history includes Breast cancer (age of onset: 65) in her mother; COPD in her father; Colon cancer in her maternal grandmother and mother; Diabetes in her brother, mother, and sister; Heart disease (age of onset: 8) in her brother; Heart failure in her father; Hypertension in her sister.    ROS:  Please see the history of present illness.   Otherwise, review of systems are positive for none.   All other systems are reviewed and negative.    PHYSICAL EXAM: VS:  LMP 06/14/2010 (Approximate)  , BMI There is no height or weight on file to calculate BMI. GEN: Well nourished, well developed, in no acute distress  HEENT: normal  Neck: no  JVD, carotid bruits, or masses Cardiac: RRR; there is a soft systolic murmur [id number] LUSB. No rubs, or gallops,no edema  Respiratory:  clear to auscultation bilaterally, normal work of breathing GI: soft, nontender, nondistended, + BS MS: no deformity or atrophy  Skin: warm and dry, no rash Neuro:  Strength and sensation are intact Psych: euthymic mood, full affect   EKG:  EKG is not ordered today.   EKG on 4/6/21showed NSR with occ PVC.  RBBB. I have personally reviewed and interpreted this study.    Recent Labs: No results found for requested labs within last 365 days.    Lipid Panel No results found for: "CHOL", "TRIG", "HDL", "CHOLHDL", "VLDL", "LDLCALC", "LDLDIRECT"    Wt Readings from Last 3 Encounters:  02/20/22 179 lb (81.2 kg)  12/05/21 176  lb 12.8 oz (80.2 kg)  04/20/21 181 lb (82.1 kg)      Other studies Reviewed: Additional studies/ records that were reviewed today include:  Labs dated 11/10/17: cholesterol 167, triglycerides 74, HDL 58, LDL 94. Hgb 12.5. Creatinine 1.15. Dated 07/05/21: cholesterol 185, triglycerides 70, HDL 63, LDL 109. Creatinine 1.11. otherwise CBC, CMET and TFTs normal.   Echo 02/08/20: IMPRESSIONS     1. Left ventricular ejection fraction, by estimation, is 60 to 65%. The  left ventricle has normal function. The left ventricle has no regional  wall motion abnormalities. There is mild left ventricular hypertrophy.  Left ventricular diastolic parameters  were normal.   2. Right ventricular systolic function is normal. The right ventricular  size is normal. There is normal pulmonary artery systolic pressure.   3. The mitral valve is grossly normal. Trivial mitral valve  regurgitation.   4. The aortic valve is tricuspid. Aortic valve regurgitation is not  visualized.   5. The inferior vena cava is normal in size with greater than 50%  respiratory variability, suggesting right atrial pressure of 3 mmHg.   ASSESSMENT AND PLAN:  1.  RBBB. This is a benign and chronic.  No symptoms.  2.  PVCs with associated palpitations. I suspect this was triggered by reduced sleep in the past. Rare recurrence. Could avoid beta blockers with Asthma.  Normal Echo in past.  3. Asthma.    Current medicines are reviewed at length with the patient today.  The patient does not have concerns regarding medicines.  The following changes have been made:  no change  Labs/ tests ordered today include:   No orders of the defined types were placed in this encounter.    Disposition:   FU PRN  Signed, Sindhu Nguyen [place], MD  03/23/2022 10:48 AM    [facility] [address], [place], [place], [address] Phone [phone], Fax [phone]

## 2022-03-26 ENCOUNTER — Other Ambulatory Visit: Payer: Self-pay

## 2022-03-26 ENCOUNTER — Encounter: Payer: Self-pay | Admitting: Emergency Medicine

## 2022-03-26 ENCOUNTER — Ambulatory Visit
Admission: EM | Admit: 2022-03-26 | Discharge: 2022-03-26 | Disposition: A | Discharge status: Home / Self Care | Payer: Medicare HMO | Attending: Internal Medicine | Admitting: Internal Medicine

## 2022-03-26 DIAGNOSIS — J4521 Mild intermittent asthma with (acute) exacerbation: Secondary | ICD-10-CM | POA: Diagnosis not present

## 2022-03-26 DIAGNOSIS — R062 Wheezing: Secondary | ICD-10-CM

## 2022-03-26 MED ORDER — PREDNISONE 20 MG PO TABS: 40.0000 mg | ORAL_TABLET | Freq: Every day | ORAL | 0 refills | Status: AC

## 2022-03-26 MED ORDER — ALBUTEROL SULFATE (2.5 MG/3ML) 0.083% IN NEBU
2.5000 mg | INHALATION_SOLUTION | Freq: Once | RESPIRATORY_TRACT | Status: AC
  Administered 2022-03-26: 2.5 mg via RESPIRATORY_TRACT

## 2022-03-26 NOTE — Discharge Instructions (Signed)
You have been prescribed prednisone steroid to alleviate your asthma flareup.  Please follow-up if symptoms persist or worsen.

## 2022-03-26 NOTE — ED Triage Notes (Signed)
Pt here for worsening asthma sx x 2 days

## 2022-03-26 NOTE — ED Provider Notes (Signed)
[name]    CSN: [id number] Arrival date & time: 03/26/22  1723      History   Chief Complaint Chief Complaint  Patient presents with   Asthma    HPI Lisa Benjamin is a 66 y.o. female.   Patient presents for concern for asthma exacerbation.  She reports that something spilled in her laundry room which triggered chest tightness and shortness of breath approximately 3 days ago.  She has used her albuterol inhaler multiple times with minimal improvement.   Asthma    Past Medical History:  Diagnosis Date   Microscopic hematuria 11/04   negative work up with Dr. Isabel Caprice   Osteopenia    Vitamin D deficiency     There are no problems to display for this patient.   Past Surgical History:  Procedure Laterality Date   CESAREAN SECTION     x2   ROTATOR CUFF REPAIR Left 12/2005   TUBAL LIGATION Bilateral 1991    OB History     Gravida  2   Para  2   Term  2   Preterm  0   AB  0   Living  2      SAB  0   IAB  0   Ectopic  0   Multiple  0   Live Births  2            Home Medications    Prior to Admission medications   Medication Sig Start Date End Date Taking? Authorizing Provider  Acetaminophen (TYLENOL) 325 MG CAPS 1 capsule as needed    [provider]  albuterol (VENTOLIN HFA) 108 (90 Base) MCG/ACT inhaler Inhale 1-2 puffs into the lungs every 6 (six) hours as needed for wheezing or shortness of breath. 11/29/21   Martina Sinner, MD  calcium carbonate (TUMS - DOSED IN MG ELEMENTAL CALCIUM) 500 MG chewable tablet Chew 2 tablets by mouth daily.     [provider]  MELATONIN PO Take by mouth.    [provider]  Multiple Vitamin (MULTIVITAMIN) tablet Take 1 tablet by mouth daily.    [provider]  predniSONE (DELTASONE) 20 MG tablet Take 2 tablets (40 mg total) by mouth daily for 5 days. 03/26/22 03/31/22 Yes Gustavus Bryant, FNP  Spacer/Aero-Holding Chambers (AEROCHAMBER MV) inhaler Use as  instructed 01/02/21   Martina Sinner, MD  VITAMIN D PO Take 2,000 Units by mouth daily.     [provider]    Family History Family History  Problem Relation Age of Onset   Breast cancer Mother 45   Diabetes Mother    Colon cancer Mother    Heart failure Father        CHF   COPD Father    Diabetes Brother    Hypertension Sister        maybe some BP issues   Diabetes Sister    Heart disease Brother 20       angioplasty   Colon cancer Maternal Grandmother     Social History Social History   Tobacco Use   Smoking status: Never   Smokeless tobacco: Never  Vaping Use   Vaping Use: Never used  Substance Use Topics   Alcohol use: Yes    Comment: occasionally   Drug use: No     Allergies   Patient has no known allergies.   Review of Systems Review of Systems Per HPI  Physical Exam Triage Vital Signs ED Triage  Vitals [03/26/22 1749]  Enc Vitals Group     BP (!) 162/75     Pulse Rate 79     Resp 18     Temp 98.1 F (36.7 C)     Temp Source Oral     SpO2 99 %     Weight      Height      Head Circumference      Peak Flow      Pain Score 2     Pain Loc      Pain Edu?      Excl. in GC?    No data found.  Updated Vital Signs BP (!) 162/75 (BP Location: Left Arm)   Pulse 79   Temp 98.1 F (36.7 C) (Oral)   Resp 18   LMP 06/14/2010 (Approximate)   SpO2 99%   Visual Acuity Right Eye Distance:   Left Eye Distance:   Bilateral Distance:    Right Eye Near:   Left Eye Near:    Bilateral Near:     Physical Exam Constitutional:      General: She is not in acute distress.    Appearance: Normal appearance. She is not toxic-appearing or diaphoretic.  HENT:     Head: Normocephalic and atraumatic.  Eyes:     Extraocular Movements: Extraocular movements intact.     Conjunctiva/sclera: Conjunctivae normal.  Cardiovascular:     Rate and Rhythm: Normal rate and regular rhythm.     Pulses: Normal pulses.     Heart sounds: Normal heart  sounds.  Pulmonary:     Effort: Pulmonary effort is normal.     Breath sounds: Wheezing present.     Comments: Mild wheezing noted on exam.  Neurological:     General: No focal deficit present.     Mental Status: She is alert and oriented to person, place, and time. Mental status is at baseline.  Psychiatric:        Mood and Affect: Mood normal.        Behavior: Behavior normal.        Thought Content: Thought content normal.        Judgment: Judgment normal.      UC Treatments / Results  Labs (all labs ordered are listed, but only abnormal results are displayed) Labs Reviewed - No data to display  EKG   Radiology No results found.  Procedures Procedures (including critical care time)  Medications Ordered in UC Medications  albuterol (PROVENTIL) (2.5 MG/3ML) 0.083% nebulizer solution 2.5 mg (2.5 mg Nebulization Given 03/26/22 1757)    Initial Impression / Assessment and Plan / UC Course  I have reviewed the triage vital signs and the nursing notes.  Pertinent labs & imaging results that were available during my care of the patient were reviewed by me and considered in my medical decision making (see chart for details).     Albuterol nebulizer treatment administered in urgent care with improvement in lung sounds and patient stating that she felt better.  Oxygen is normal and patient is not tachypneic so do not think that emergent evaluation at the hospital is necessary at this time.  Will prescribe prednisone steroid for asthma flareup.  Discussed return and ER precautions.  Patient verbalized understanding and was agreeable with plan. Final Clinical Impressions(s) / UC Diagnoses   Final diagnoses:  Mild intermittent asthma with acute exacerbation  Wheezing     Discharge Instructions      You have been prescribed prednisone  steroid to alleviate your asthma flareup.  Please follow-up if symptoms persist or worsen.     ED Prescriptions     Medication Sig  Dispense Auth. Provider   predniSONE (DELTASONE) 20 MG tablet Take 2 tablets (40 mg total) by mouth daily for 5 days. 10 tablet Gustavus Bryant, [place]      PDMP not reviewed this encounter.   Gustavus Bryant, [place] 03/26/22 [phone]

## 2022-03-27 ENCOUNTER — Ambulatory Visit: Payer: Medicare HMO | Admitting: Cardiology

## 2022-03-27 ENCOUNTER — Encounter: Payer: Self-pay | Admitting: Cardiology

## 2022-03-27 VITALS — BP 108/73 | HR 87 | Ht 62.0 in | Wt 178.6 lb

## 2022-03-27 DIAGNOSIS — I493 Ventricular premature depolarization: Secondary | ICD-10-CM | POA: Diagnosis not present

## 2022-03-27 DIAGNOSIS — I4510 Unspecified right bundle-branch block: Secondary | ICD-10-CM

## 2022-03-27 NOTE — Patient Instructions (Addendum)
Medication Instructions:   NO CHANGES   Lab Work:  NOT NEEDED   Testing/Procedures: NOT NEEDED   Follow-Up: At [facility], you and your health needs are our priority.  As part of our continuing mission to provide you with exceptional heart care, we have created designated Provider Care Teams.  These Care Teams include your primary Cardiologist (physician) and Advanced Practice Providers (APPs -  Physician Assistants and Nurse Practitioners) who all work together to provide you with the care you need, when you need it.     Your next appointment:     AS NEEDED  The format for your next appointment:   In Person  Provider:   Peter [place], MD

## 2022-04-10 DIAGNOSIS — J45909 Unspecified asthma, uncomplicated: Secondary | ICD-10-CM | POA: Diagnosis not present

## 2022-04-10 DIAGNOSIS — E669 Obesity, unspecified: Secondary | ICD-10-CM | POA: Diagnosis not present

## 2022-04-10 DIAGNOSIS — Z6832 Body mass index (BMI) 32.0-32.9, adult: Secondary | ICD-10-CM | POA: Diagnosis not present

## 2022-04-19 ENCOUNTER — Ambulatory Visit
Admission: EM | Admit: 2022-04-19 | Discharge: 2022-04-19 | Disposition: A | Discharge status: Home / Self Care | Payer: Medicare HMO | Attending: Internal Medicine | Admitting: Internal Medicine

## 2022-04-19 DIAGNOSIS — M549 Dorsalgia, unspecified: Secondary | ICD-10-CM

## 2022-04-19 MED ORDER — METHOCARBAMOL 500 MG PO TABS: 500.0000 mg | ORAL_TABLET | Freq: Every evening | ORAL | 0 refills | Status: DC | PRN

## 2022-04-19 MED ORDER — TRAMADOL HCL 50 MG PO TABS: 50.0000 mg | ORAL_TABLET | Freq: Four times a day (QID) | ORAL | 0 refills | Status: DC | PRN

## 2022-04-19 NOTE — Discharge Instructions (Signed)
Gentle stretching exercises Take medications as prescribed Heating pad use only 20 minutes on-20 minutes off cycle Do not drive or operate heavy machinery after taking muscle relaxants because they will make you drowsy Do not need x-rays at this time Return to urgent care if you have worsening pain.

## 2022-04-19 NOTE — ED Triage Notes (Signed)
Pt c/o falling in shower yesterday morning. States she hit under her right arm onto the shower chair as she fell, and fell onto her right side. Concerned today that her left neck is hurting. States right body pain was immediate, and neck pain started today.

## 2022-04-23 NOTE — ED Provider Notes (Signed)
[name]    CSN: [id number] Arrival date & time: 04/19/22  1509      History   Chief Complaint Chief Complaint  Patient presents with   fall at home    HPI Lisa Benjamin is a 66 y.o. female comes to the urgent care accompanied by her husband for right upper back pain which happened after she fell in the shower this morning.  She slipped and fell in the bathtub.  Patient denies hitting her head.  No dizziness, near syncope or syncopal episodes.  No nausea or vomiting at this time.  Pain is mainly in the right upper back and neck.  Pain is sharp and aggravated by movement and palpation.  No known relieving factors.  It is associated with some stiffness in the right upper back.  No shortness of breath or wheezing.  No nausea or vomiting.Marland Kitchen   HPI  Past Medical History:  Diagnosis Date   Microscopic hematuria 11/04   negative work up with Dr. Isabel Caprice   Osteopenia    Vitamin D deficiency     There are no problems to display for this patient.   Past Surgical History:  Procedure Laterality Date   CESAREAN SECTION     x2   ROTATOR CUFF REPAIR Left 12/2005   TUBAL LIGATION Bilateral 1991    OB History     Gravida  2   Para  2   Term  2   Preterm  0   AB  0   Living  2      SAB  0   IAB  0   Ectopic  0   Multiple  0   Live Births  2            Home Medications    Prior to Admission medications   Medication Sig Start Date End Date Taking? Authorizing Provider  methocarbamol (ROBAXIN) 500 MG tablet Take 1 tablet (500 mg total) by mouth at bedtime as needed for muscle spasms. 04/19/22  Yes Tahjae Durr, Britta Mccreedy, MD  traMADol (ULTRAM) 50 MG tablet Take 1 tablet (50 mg total) by mouth every 6 (six) hours as needed. 04/19/22  Yes Collan Schoenfeld, Britta Mccreedy, MD  Acetaminophen (TYLENOL) 325 MG CAPS 1 capsule as needed    [provider]  albuterol (VENTOLIN HFA) 108 (90 Base) MCG/ACT inhaler Inhale 1-2 puffs into the lungs every 6 (six) hours as needed  for wheezing or shortness of breath. 11/29/21   Martina Sinner, MD  calcium carbonate (TUMS - DOSED IN MG ELEMENTAL CALCIUM) 500 MG chewable tablet Chew 2 tablets by mouth daily.     [provider]  chlorhexidine (PERIDEX) 0.12 % solution 2 (two) times daily. 12/12/21   [provider]  fluticasone (FLONASE) 50 MCG/ACT nasal spray Place into both nostrils. 01/22/22   [provider]  Multiple Vitamin (MULTIVITAMIN) tablet Take 1 tablet by mouth daily.    [provider]  [facility] injection  12/09/21   [provider]  Spacer/Aero-Holding Chambers (AEROCHAMBER MV) inhaler Use as instructed 01/02/21   Martina Sinner, MD  VITAMIN D PO Take 2,000 Units by mouth daily.     [provider]    Family History Family History  Problem Relation Age of Onset   Breast cancer Mother 39   Diabetes Mother    Colon cancer Mother    Heart failure Father        CHF   COPD Father  Diabetes Brother    Hypertension Sister        maybe some BP issues   Diabetes Sister    Heart disease Brother 25       angioplasty   Colon cancer Maternal Grandmother     Social History Social History   Tobacco Use   Smoking status: Never   Smokeless tobacco: Never  Vaping Use   Vaping Use: Never used  Substance Use Topics   Alcohol use: Yes    Comment: occasionally   Drug use: No     Allergies   Patient has no known allergies.   Review of Systems Review of Systems  Respiratory: Negative.    Cardiovascular: Negative.   Gastrointestinal: Negative.   Musculoskeletal:  Positive for arthralgias and myalgias.  Neurological: Negative.      Physical Exam Triage Vital Signs ED Triage Vitals  Enc Vitals Group     BP 04/19/22 1545 (!) 150/70     Pulse Rate 04/19/22 1545 65     Resp 04/19/22 1545 18     Temp 04/19/22 1545 98 F (36.7 C)     Temp Source 04/19/22 1545 Oral     SpO2 04/19/22 1545 98 %     Weight --      Height --      Head  Circumference --      Peak Flow --      Pain Score 04/19/22 1546 0     Pain Loc --      Pain Edu? --      Excl. in GC? --    No data found.  Updated Vital Signs BP (!) 150/70 (BP Location: Left Arm)   Pulse 65   Temp 98 F (36.7 C) (Oral)   Resp 18   LMP 06/14/2010 (Approximate)   SpO2 98%   Visual Acuity Right Eye Distance:   Left Eye Distance:   Bilateral Distance:    Right Eye Near:   Left Eye Near:    Bilateral Near:     Physical Exam Vitals and nursing note reviewed.  Constitutional:      General: She is not in acute distress.    Appearance: She is not ill-appearing.  Cardiovascular:     Rate and Rhythm: Normal rate and regular rhythm.  Pulmonary:     Effort: Pulmonary effort is normal.     Breath sounds: Normal breath sounds.  Musculoskeletal:        General: Tenderness present. No swelling. Normal range of motion.  Skin:    General: Skin is warm.     Capillary Refill: Capillary refill takes less than 2 seconds.     Coloration: Skin is not pale.     Findings: No bruising or erythema.  Neurological:     Mental Status: She is alert.      UC Treatments / Results  Labs (all labs ordered are listed, but only abnormal results are displayed) Labs Reviewed - No data to display  EKG   Radiology No results found.  Procedures Procedures (including critical care time)  Medications Ordered in UC Medications - No data to display  Initial Impression / Assessment and Plan / UC Course  I have reviewed the triage vital signs and the nursing notes.  Pertinent labs & imaging results that were available during my care of the patient were reviewed by me and considered in my medical decision making (see chart for details).     1.  Right upper back pain: Heating  pad use only 20 minutes on-20 minutes of cycle Gentle range of motion exercises Tramadol 50 mg every 6 hours as needed for pain Patient is unable to take ibuprofen because of chronic kidney  disease Robaxin at bedtime as needed for muscle stiffness Medication precautions given X-ray is not indicated at this time Return precautions given. Final Clinical Impressions(s) / UC Diagnoses   Final diagnoses:  Upper back pain on right side     Discharge Instructions      Gentle stretching exercises Take medications as prescribed Heating pad use only 20 minutes on-20 minutes off cycle Do not drive or operate heavy machinery after taking muscle relaxants because they will make you drowsy Do not need x-rays at this time Return to urgent care if you have worsening pain.   ED Prescriptions     Medication Sig Dispense Auth. Provider   traMADol (ULTRAM) 50 MG tablet Take 1 tablet (50 mg total) by mouth every 6 (six) hours as needed. 10 tablet Donta Fuster, Britta Mccreedy, MD   methocarbamol (ROBAXIN) 500 MG tablet Take 1 tablet (500 mg total) by mouth at bedtime as needed for muscle spasms. 5 tablet Kaneshia Cater, Britta Mccreedy, MD      I have reviewed the PDMP during this encounter.   Merrilee Jansky, MD 04/23/22 [phone]

## 2022-04-26 DIAGNOSIS — Z1231 Encounter for screening mammogram for malignant neoplasm of breast: Secondary | ICD-10-CM | POA: Diagnosis not present

## 2022-05-13 DIAGNOSIS — L03119 Cellulitis of unspecified part of limb: Secondary | ICD-10-CM | POA: Diagnosis not present

## 2022-06-13 ENCOUNTER — Other Ambulatory Visit: Payer: Self-pay | Admitting: Pulmonary Disease

## 2022-07-15 DIAGNOSIS — R69 Illness, unspecified: Secondary | ICD-10-CM | POA: Diagnosis not present

## 2022-07-15 DIAGNOSIS — Z0000 Encounter for general adult medical examination without abnormal findings: Secondary | ICD-10-CM | POA: Diagnosis not present

## 2022-07-15 DIAGNOSIS — M25561 Pain in right knee: Secondary | ICD-10-CM | POA: Diagnosis not present

## 2022-07-15 DIAGNOSIS — I493 Ventricular premature depolarization: Secondary | ICD-10-CM | POA: Diagnosis not present

## 2022-07-15 DIAGNOSIS — E559 Vitamin D deficiency, unspecified: Secondary | ICD-10-CM | POA: Diagnosis not present

## 2022-07-15 DIAGNOSIS — J4540 Moderate persistent asthma, uncomplicated: Secondary | ICD-10-CM | POA: Diagnosis not present

## 2022-07-15 DIAGNOSIS — R030 Elevated blood-pressure reading, without diagnosis of hypertension: Secondary | ICD-10-CM | POA: Diagnosis not present

## 2022-07-15 DIAGNOSIS — E785 Hyperlipidemia, unspecified: Secondary | ICD-10-CM | POA: Diagnosis not present

## 2022-07-15 DIAGNOSIS — M8588 Other specified disorders of bone density and structure, other site: Secondary | ICD-10-CM | POA: Diagnosis not present

## 2022-07-16 ENCOUNTER — Other Ambulatory Visit: Payer: Self-pay | Admitting: Family Medicine

## 2022-07-16 DIAGNOSIS — M8580 Other specified disorders of bone density and structure, unspecified site: Secondary | ICD-10-CM

## 2022-07-25 DIAGNOSIS — M25561 Pain in right knee: Secondary | ICD-10-CM | POA: Diagnosis not present

## 2022-08-13 DIAGNOSIS — F411 Generalized anxiety disorder: Secondary | ICD-10-CM | POA: Diagnosis not present

## 2022-08-13 DIAGNOSIS — R69 Illness, unspecified: Secondary | ICD-10-CM | POA: Diagnosis not present

## 2022-08-14 DIAGNOSIS — I83812 Varicose veins of left lower extremities with pain: Secondary | ICD-10-CM | POA: Diagnosis not present

## 2022-08-14 DIAGNOSIS — R252 Cramp and spasm: Secondary | ICD-10-CM | POA: Diagnosis not present

## 2022-08-19 DIAGNOSIS — I83812 Varicose veins of left lower extremities with pain: Secondary | ICD-10-CM | POA: Diagnosis not present

## 2022-08-27 DIAGNOSIS — R252 Cramp and spasm: Secondary | ICD-10-CM | POA: Diagnosis not present

## 2022-08-27 DIAGNOSIS — I83892 Varicose veins of left lower extremities with other complications: Secondary | ICD-10-CM | POA: Diagnosis not present

## 2022-09-03 DIAGNOSIS — I83892 Varicose veins of left lower extremities with other complications: Secondary | ICD-10-CM | POA: Diagnosis not present

## 2022-09-03 DIAGNOSIS — I87392 Chronic venous hypertension (idiopathic) with other complications of left lower extremity: Secondary | ICD-10-CM | POA: Diagnosis not present

## 2022-09-11 DIAGNOSIS — Z23 Encounter for immunization: Secondary | ICD-10-CM | POA: Diagnosis not present

## 2022-09-11 DIAGNOSIS — W19XXXA Unspecified fall, initial encounter: Secondary | ICD-10-CM | POA: Diagnosis not present

## 2022-09-11 DIAGNOSIS — S80212A Abrasion, left knee, initial encounter: Secondary | ICD-10-CM | POA: Diagnosis not present

## 2022-09-11 DIAGNOSIS — M25511 Pain in right shoulder: Secondary | ICD-10-CM | POA: Diagnosis not present

## 2022-09-11 DIAGNOSIS — S60032A Contusion of left middle finger without damage to nail, initial encounter: Secondary | ICD-10-CM | POA: Diagnosis not present

## 2022-09-12 DIAGNOSIS — R69 Illness, unspecified: Secondary | ICD-10-CM | POA: Diagnosis not present

## 2022-09-12 DIAGNOSIS — F411 Generalized anxiety disorder: Secondary | ICD-10-CM | POA: Diagnosis not present

## 2022-09-13 DIAGNOSIS — M25511 Pain in right shoulder: Secondary | ICD-10-CM | POA: Diagnosis not present

## 2022-09-20 ENCOUNTER — Ambulatory Visit (INDEPENDENT_AMBULATORY_CARE_PROVIDER_SITE_OTHER): Payer: Medicare HMO

## 2022-09-20 ENCOUNTER — Ambulatory Visit
Admission: RE | Admit: 2022-09-20 | Discharge: 2022-09-20 | Disposition: A | Discharge status: Home / Self Care | Payer: Medicare HMO | Source: Ambulatory Visit | Attending: Physician Assistant | Admitting: Physician Assistant

## 2022-09-20 VITALS — BP 178/71 | HR 61 | Temp 97.8°F | Resp 17

## 2022-09-20 DIAGNOSIS — M79644 Pain in right finger(s): Secondary | ICD-10-CM | POA: Diagnosis not present

## 2022-09-20 DIAGNOSIS — S67190A Crushing injury of right index finger, initial encounter: Secondary | ICD-10-CM

## 2022-09-20 DIAGNOSIS — S6991XA Unspecified injury of right wrist, hand and finger(s), initial encounter: Secondary | ICD-10-CM | POA: Diagnosis not present

## 2022-09-20 NOTE — ED Provider Notes (Signed)
[name]    CSN: [id number] Arrival date & time: 09/20/22  1149      History   Chief Complaint Chief Complaint  Patient presents with   APPT: Finger Injury    HPI Lisa Benjamin is a 66 y.o. female.   Patient here today for evaluation of right index finger injury that occurred yesterday when she accidentally smashed her finger in a car door.  She reports that she broke the same finger 25 years ago by smashing it in a car door as well.  She does not report any numbness.  She does note pain in the area of injury.  She does not report treatment.  The history is provided by the patient.    Past Medical History:  Diagnosis Date   Microscopic hematuria 11/04   negative work up with Dr. Isabel Caprice   Osteopenia    Vitamin D deficiency     There are no problems to display for this patient.   Past Surgical History:  Procedure Laterality Date   CESAREAN SECTION     x2   ROTATOR CUFF REPAIR Left 12/2005   TUBAL LIGATION Bilateral 1991    OB History     Gravida  2   Para  2   Term  2   Preterm  0   AB  0   Living  2      SAB  0   IAB  0   Ectopic  0   Multiple  0   Live Births  2            Home Medications    Prior to Admission medications   Medication Sig Start Date End Date Taking? Authorizing Provider  Acetaminophen (TYLENOL) 325 MG CAPS 1 capsule as needed    [provider]  albuterol (VENTOLIN HFA) 108 (90 Base) MCG/ACT inhaler INHALE 1 TO 2 PUFFS BY MOUTH EVERY 6 HOURS AS NEEDED FOR WHEEZING OR  SHORTNESS  OF  BREATH 06/14/22   Martina Sinner, MD  calcium carbonate (TUMS - DOSED IN MG ELEMENTAL CALCIUM) 500 MG chewable tablet Chew 2 tablets by mouth daily.     [provider]  chlorhexidine (PERIDEX) 0.12 % solution 2 (two) times daily. 12/12/21   [provider]  fluticasone (FLONASE) 50 MCG/ACT nasal spray Place into both nostrils. 01/22/22   [provider]  methocarbamol (ROBAXIN) 500 MG  tablet Take 1 tablet (500 mg total) by mouth at bedtime as needed for muscle spasms. 04/19/22   Merrilee Jansky, MD  Multiple Vitamin (MULTIVITAMIN) tablet Take 1 tablet by mouth daily.    [provider]  [facility] injection  12/09/21   [provider]  Spacer/Aero-Holding Chambers (AEROCHAMBER MV) inhaler Use as instructed 01/02/21   Martina Sinner, MD  traMADol (ULTRAM) 50 MG tablet Take 1 tablet (50 mg total) by mouth every 6 (six) hours as needed. 04/19/22   LampteyBritta Mccreedy, MD  VITAMIN D PO Take 2,000 Units by mouth daily.     [provider]    Family History Family History  Problem Relation Age of Onset   Breast cancer Mother 16   Diabetes Mother    Colon cancer Mother    Heart failure Father        CHF   COPD Father    Diabetes Brother    Hypertension Sister        maybe some BP issues   Diabetes Sister  Heart disease Brother 10       angioplasty   Colon cancer Maternal Grandmother     Social History Social History   Tobacco Use   Smoking status: Never   Smokeless tobacco: Never  Vaping Use   Vaping Use: Never used  Substance Use Topics   Alcohol use: Yes    Comment: occasionally   Drug use: No     Allergies   Patient has no known allergies.   Review of Systems Review of Systems  Constitutional:  Negative for chills and fever.  Eyes:  Negative for discharge and redness.  Musculoskeletal:  Positive for arthralgias. Negative for joint swelling.  Skin:  Negative for color change and wound.  Neurological:  Negative for numbness.     Physical Exam Triage Vital Signs ED Triage Vitals  Enc Vitals Group     BP      Pulse      Resp      Temp      Temp src      SpO2      Weight      Height      Head Circumference      Peak Flow      Pain Score      Pain Loc      Pain Edu?      Excl. in GC?    No data found.  Updated Vital Signs BP (!) 178/71 (BP Location: Left Arm)   Pulse 61   Temp 97.8 F (36.6 C) (Oral)    Resp 17   LMP 06/14/2010 (Approximate)   SpO2 99%       Physical Exam Vitals and nursing note reviewed.  Constitutional:      General: She is not in acute distress.    Appearance: Normal appearance. She is not ill-appearing.  HENT:     Head: Normocephalic and atraumatic.  Eyes:     Conjunctiva/sclera: Conjunctivae normal.  Cardiovascular:     Rate and Rhythm: Normal rate.  Pulmonary:     Effort: Pulmonary effort is normal.  Musculoskeletal:     Comments: Mild swelling noted between right index  PIP, DIP- TTP to same, Decreased ROM of right index DIP, PIP, normal ROM Of right index MCP.   Skin:    General: Skin is warm and dry.     Capillary Refill: Normal cap refill to right index finger Neurological:     Mental Status: She is alert.     Comments: Gross sensation intact to distal right index finger  Psychiatric:        Mood and Affect: Mood normal.        Behavior: Behavior normal.        Thought Content: Thought content normal.      UC Treatments / Results  Labs (all labs ordered are listed, but only abnormal results are displayed) Labs Reviewed - No data to display  EKG   Radiology DG Finger Index Right  Result Date: 09/20/2022 CLINICAL DATA:  Injury. Slammed index finger in car door. Pain at the D IP joint and middle phalanx. EXAM: RIGHT INDEX FINGER 2+V COMPARISON:  None Available. FINDINGS: There is no evidence of fracture or dislocation. There is no evidence of arthropathy or other focal bone abnormality. Soft tissues are unremarkable. IMPRESSION: Negative. Electronically Signed   By: Signa Kell M.D.   On: 09/20/2022 13:25    Procedures Procedures (including critical care time)  Medications Ordered in UC Medications - No data  to display  Initial Impression / Assessment and Plan / UC Course  I have reviewed the triage vital signs and the nursing notes.  Pertinent labs & imaging results that were available during my care of the patient were reviewed by  me and considered in my medical decision making (see chart for details).    Xray with no fracture noted. Recommend continued OTC medication if needed. Encourage follow up with any further concerns.   Final Clinical Impressions(s) / UC Diagnoses   Final diagnoses:  Crushing injury of right index finger, initial encounter     Discharge Instructions      No fracture on xray.   Recommend ibuprofen or tylenol if needed for pain.   Encourage follow up if no improvement over the next week or two.      ED Prescriptions   None    PDMP not reviewed this encounter.   Francene Finders, PA-C 09/20/22 1738

## 2022-09-20 NOTE — ED Triage Notes (Signed)
Pt presents with right index finger injury after smashing it in car door yesterday.

## 2022-09-20 NOTE — Discharge Instructions (Signed)
No fracture on xray.   Recommend ibuprofen or tylenol if needed for pain.   Encourage follow up if no improvement over the next week or two.

## 2022-10-13 DIAGNOSIS — R69 Illness, unspecified: Secondary | ICD-10-CM | POA: Diagnosis not present

## 2022-10-13 DIAGNOSIS — F411 Generalized anxiety disorder: Secondary | ICD-10-CM | POA: Diagnosis not present

## 2022-10-15 DIAGNOSIS — H0288A Meibomian gland dysfunction right eye, upper and lower eyelids: Secondary | ICD-10-CM | POA: Diagnosis not present

## 2022-10-15 DIAGNOSIS — H04123 Dry eye syndrome of bilateral lacrimal glands: Secondary | ICD-10-CM | POA: Diagnosis not present

## 2022-10-15 DIAGNOSIS — H0288B Meibomian gland dysfunction left eye, upper and lower eyelids: Secondary | ICD-10-CM | POA: Diagnosis not present

## 2022-10-15 DIAGNOSIS — H25813 Combined forms of age-related cataract, bilateral: Secondary | ICD-10-CM | POA: Diagnosis not present

## 2022-10-15 DIAGNOSIS — H1045 Other chronic allergic conjunctivitis: Secondary | ICD-10-CM | POA: Diagnosis not present

## 2022-10-15 DIAGNOSIS — H524 Presbyopia: Secondary | ICD-10-CM | POA: Diagnosis not present

## 2022-11-13 DIAGNOSIS — F411 Generalized anxiety disorder: Secondary | ICD-10-CM | POA: Diagnosis not present

## 2022-11-13 DIAGNOSIS — R69 Illness, unspecified: Secondary | ICD-10-CM | POA: Diagnosis not present

## 2022-12-09 ENCOUNTER — Encounter: Payer: Self-pay | Admitting: Pulmonary Disease

## 2022-12-09 ENCOUNTER — Ambulatory Visit: Payer: Medicare HMO | Admitting: Pulmonary Disease

## 2022-12-09 ENCOUNTER — Ambulatory Visit (INDEPENDENT_AMBULATORY_CARE_PROVIDER_SITE_OTHER): Payer: Medicare HMO

## 2022-12-09 VITALS — BP 142/74 | HR 73 | Ht 62.0 in | Wt 181.6 lb

## 2022-12-09 DIAGNOSIS — R051 Acute cough: Secondary | ICD-10-CM | POA: Diagnosis not present

## 2022-12-09 DIAGNOSIS — J4520 Mild intermittent asthma, uncomplicated: Secondary | ICD-10-CM | POA: Diagnosis not present

## 2022-12-09 DIAGNOSIS — R059 Cough, unspecified: Secondary | ICD-10-CM | POA: Diagnosis not present

## 2022-12-09 NOTE — Patient Instructions (Signed)
We will check a chest x-ray for your cough symptoms  Try using albuterol scheduled every 6 hours, 2 puffs for the next 24 to 48 hours and monitor for improvement in your cough.   If symptoms persist, please message or call us and we will send in a prescription for symbicort to use until symptoms resolved.  Follow up in 1 year, or sooner if needed

## 2022-12-09 NOTE — Progress Notes (Unsigned)
Synopsis: Referred in March 2022 by Benjiman Core, PA for asthma  Subjective:   PATIENT ID: Lisa Benjamin GENDER: female DOB: Aug 19, 1956, MRN: [id number]  HPI  Chief Complaint  Patient presents with   Follow-up    67yrf/u for asthma. States she has doing well since last visit. Using albuterol a few times a year.    Lisa Benjamin a 67year old woman, never smoker who returns to pulmonary clinic for asthma follow up.   She has been doing well since last year but has been having increase in cough symptoms recently. No mucous production, fevers or chills. She has not been using her albuterol frequently.  OV 12/05/2021 She has been doing well since last visit and has not required Symbicort inhaler as needed.  She is using albuterol inhaler very infrequently at this time.  She denies any cough, wheezing or shortness of breath at this time.  She denies issues with seasonal allergies.  OV 04/20/21 Patient is doing significantly better since last visit.  She has been on Symbicort 2 puffs twice daily and as needed albuterol.  She reports increased use of albuterol during the month of June due to the hot humid weather where she used it 3-4 times.  She otherwise denies any nighttime awakenings.  She denies any cough, wheezing or shortness of breath at this time.  OV 01/02/21 She reports having history of asthma in her childhood which improved after she was 67. She reports a couple episodes over recent years where she required steroids and antibiotics for bouts of bronchitis.   She was ill with covid 19 in early January and since then has been having issues with wheezing, cough and exertional dyspnea. She has been treated with 3 rounds of steroids, the most recent being earlier this month. She reports feeling better after this third round. She is using symbicort 160-4.530m 2 puffs twice daily and as needed albuterol. She continues to experience exeritonal dyspnea with walking. She was planning to  participate in a running race this July and was hoping to start training for that soon.   She denies lower extremity edema, redness or pain. She denies chest pain or discomfort.   Past Medical History:  Diagnosis Date   Microscopic hematuria 11/04   negative work up with Dr. GrRisa Grill Osteopenia    Vitamin D deficiency      Family History  Problem Relation Age of Onset   Breast cancer Mother [id number] Diabetes Mother    Colon cancer Mother    Heart failure Father        CHF   COPD Father    Diabetes Brother    Hypertension Sister        maybe some BP issues   Diabetes Sister    Heart disease Brother [id number]     angioplasty   Colon cancer Maternal Grandmother      Social History   Socioeconomic History   Marital status: Married    Spouse name: Not on file   Number of children: 2   Years of education: Not on file   Highest education level: Not on file  Occupational History    [employer]er: [employer]   Smoking status: Never   Smokeless tobacco: Never  Vaping Use   Vaping Use: Never used  Substance and Sexual Activity   Alcohol use: Yes    Comment: occasionally   Drug use: No   Sexual activity:  Yes    Partners: Male    Birth control/protection: Post-menopausal    Comment: medicare questions reviewed:  all negative  Other Topics Concern   Not on file  Social History Narrative   Not on file   Social Determinants of Health   Financial Resource Strain: Not on file  Food Insecurity: Not on file  Transportation Needs: Not on file  Physical Activity: Not on file  Stress: Not on file  Social Connections: Not on file  Intimate Partner Violence: Not on file     No Known Allergies   Outpatient Medications Prior to Visit  Medication Sig Dispense Refill   Acetaminophen (TYLENOL) 325 MG CAPS 1 capsule as needed     albuterol (VENTOLIN HFA) 108 (90 Base) MCG/ACT inhaler INHALE 1 TO 2 PUFFS BY MOUTH EVERY 6 HOURS AS NEEDED FOR WHEEZING OR   SHORTNESS  OF  BREATH 18 g 2   calcium carbonate (TUMS - DOSED IN MG ELEMENTAL CALCIUM) 500 MG chewable tablet Chew 2 tablets by mouth daily.      fluticasone (FLONASE) 50 MCG/ACT nasal spray Place into both nostrils.     Multiple Vitamin (MULTIVITAMIN) tablet Take 1 tablet by mouth daily.     SHINGRIX injection      Spacer/Aero-Holding Chambers (AEROCHAMBER MV) inhaler Use as instructed 1 each 0   VITAMIN D PO Take 2,000 Units by mouth daily.      chlorhexidine (PERIDEX) 0.12 % solution 2 (two) times daily.     methocarbamol (ROBAXIN) 500 MG tablet Take 1 tablet (500 mg total) by mouth at bedtime as needed for muscle spasms. 5 tablet 0   traMADol (ULTRAM) 50 MG tablet Take 1 tablet (50 mg total) by mouth every 6 (six) hours as needed. 10 tablet 0   No facility-administered medications prior to visit.    Review of Systems  Constitutional:  Negative for chills, fever, malaise/fatigue and weight loss.  HENT:  Negative for congestion, sinus pain and sore throat.   Eyes: Negative.   Respiratory:  Positive for cough. Negative for hemoptysis, sputum production, shortness of breath and wheezing.   Cardiovascular:  Negative for chest pain, palpitations, orthopnea, claudication and leg swelling.  Gastrointestinal:  Negative for abdominal pain, heartburn, nausea and vomiting.  Genitourinary: Negative.   Musculoskeletal:  Negative for joint pain and myalgias.  Skin:  Negative for rash.  Neurological:  Negative for weakness.  Endo/Heme/Allergies: Negative.   Psychiatric/Behavioral: Negative.      Objective:   Vitals:   12/09/22 1502  BP: (!) 142/74  Pulse: 73  SpO2: 100%  Weight: 181 lb 9.6 oz (82.4 kg)  Height: '5\' 2"'$  (1.575 m)     Physical Exam Constitutional:      General: She is not in acute distress.    Appearance: Normal appearance. She is not ill-appearing.  HENT:     Head: Normocephalic and atraumatic.  Eyes:     General: No scleral icterus.    Conjunctiva/sclera:  Conjunctivae normal.     Pupils: Pupils are equal, round, and reactive to light.  Cardiovascular:     Rate and Rhythm: Normal rate and regular rhythm.     Pulses: Normal pulses.     Heart sounds: Normal heart sounds. No murmur heard. Pulmonary:     Effort: Pulmonary effort is normal.     Breath sounds: Normal breath sounds. No wheezing, rhonchi or rales.  Musculoskeletal:     Right lower leg: No edema.     Left lower leg:  No edema.  Skin:    General: Skin is warm and dry.  Neurological:     General: No focal deficit present.     Mental Status: She is alert.    CBC    Component Value Date/Time   WBC 5.6 01/18/2020 0948   RBC 4.23 01/18/2020 0948   HGB 12.8 01/18/2020 0948   HGB 12.0 09/27/2013 1346   HCT 39.1 01/18/2020 0948   PLT 327 01/18/2020 0948   MCV 92.4 01/18/2020 0948   MCH 30.3 01/18/2020 0948   MCHC 32.7 01/18/2020 0948   RDW 13.9 01/18/2020 0948      Latest Ref Rng & Units 01/18/2020    9:48 AM  BMP  Glucose 70 - 99 mg/dL 108   BUN 8 - 23 mg/dL 17   Creatinine 0.44 - 1.00 mg/dL 1.21   Sodium 135 - 145 mmol/L 141   Potassium 3.5 - 5.1 mmol/L 3.9   Chloride 98 - 111 mmol/L 106   CO2 22 - 32 mmol/L 26   Calcium 8.9 - 10.3 mg/dL 9.4    Chest imaging: CXR 12/09/22 Stable cardiomediastinal silhouette. Both lungs are clear. The visualized skeletal structures are unremarkable.  CXR 12/12/20 The cardiomediastinal contours are normal. The lungs are clear. Pulmonary vasculature is normal. No consolidation, pleural effusion, or pneumothorax. No acute osseous abnormalities are seen.  PFT:    Latest Ref Rng & Units 04/20/2021   10:47 AM  PFT Results  FVC-Pre L 2.77   FVC-Predicted Pre % 122   FVC-Post L 2.67   FVC-Predicted Post % 117   Pre FEV1/FVC % % 91   Post FEV1/FCV % % 90   FEV1-Pre L 2.51   FEV1-Predicted Pre % 142   FEV1-Post L 2.41   DLCO uncorrected ml/min/mmHg 22.98   DLCO UNC% % 124   DLCO corrected ml/min/mmHg 22.98   DLCO COR %Predicted %  124   DLVA Predicted % 129   PFT 04/20/21: within normal limits  Echo 02/08/20:  1. Left ventricular ejection fraction, by estimation, is 60 to 65%. The  left ventricle has normal function. The left ventricle has no regional  wall motion abnormalities. There is mild left ventricular hypertrophy.  Left ventricular diastolic parameters  were normal.   2. Right ventricular systolic function is normal. The right ventricular  size is normal. There is normal pulmonary artery systolic pressure.   3. The mitral valve is grossly normal. Trivial mitral valve  regurgitation.   4. The aortic valve is tricuspid. Aortic valve regurgitation is not  visualized.   5. The inferior vena cava is normal in size with greater than 50%  respiratory variability, suggesting right atrial pressure of 3 mmHg.  24Hr Holter Monitor 12/11/17 Normal sinus rhythm with normal HR range Rare isolated PACs and PVCs No junctional rhythm seen  Assessment & Plan:   Mild intermittent asthma without complication  Acute cough - Plan: DG Chest 2 View  Discussion: Lisa Benjamin is a 67 year old woman, never smoker who returns to pulmonary clinic for asthma follow up.   She has mild intermittent asthma.  She is doing well on as needed albuterol. She is to use albuterol scheduled every 6 hours over the next couple of days due to her increase in cough symptoms. She is to call us if her symptoms progress and we will likely resume ICS/LABA inhaler therapy for her.   Chest x-ray is unremarkable today.  Follow-up in 1 year.  Freda Jackson, MD [facility] Office: [phone]   Current Outpatient Medications:    Acetaminophen (TYLENOL) 325 MG CAPS, 1 capsule as needed, Disp: , Rfl:    albuterol (VENTOLIN HFA) 108 (90 Base) MCG/ACT inhaler, INHALE 1 TO 2 PUFFS BY MOUTH EVERY 6 HOURS AS NEEDED FOR WHEEZING OR  SHORTNESS  OF  BREATH, Disp: 18 g, Rfl: 2   calcium carbonate (TUMS - DOSED IN MG ELEMENTAL  CALCIUM) 500 MG chewable tablet, Chew 2 tablets by mouth daily. , Disp: , Rfl:    fluticasone (FLONASE) 50 MCG/ACT nasal spray, Place into both nostrils., Disp: , Rfl:    Multiple Vitamin (MULTIVITAMIN) tablet, Take 1 tablet by mouth daily., Disp: , Rfl:    SHINGRIX injection, , Disp: , Rfl:    Spacer/Aero-Holding Chambers (AEROCHAMBER MV) inhaler, Use as instructed, Disp: 1 each, Rfl: 0   VITAMIN D PO, Take 2,000 Units by mouth daily. , Disp: , Rfl:

## 2022-12-10 DIAGNOSIS — I872 Venous insufficiency (chronic) (peripheral): Secondary | ICD-10-CM | POA: Diagnosis not present

## 2022-12-10 DIAGNOSIS — I83892 Varicose veins of left lower extremities with other complications: Secondary | ICD-10-CM | POA: Diagnosis not present

## 2022-12-12 ENCOUNTER — Encounter: Payer: Self-pay | Admitting: Pulmonary Disease

## 2022-12-12 DIAGNOSIS — R69 Illness, unspecified: Secondary | ICD-10-CM | POA: Diagnosis not present

## 2022-12-12 DIAGNOSIS — F411 Generalized anxiety disorder: Secondary | ICD-10-CM | POA: Diagnosis not present

## 2022-12-13 ENCOUNTER — Other Ambulatory Visit (HOSPITAL_COMMUNITY): Payer: Self-pay

## 2022-12-13 MED ORDER — BUDESONIDE-FORMOTEROL FUMARATE 160-4.5 MCG/ACT IN AERO
2.0000 | INHALATION_SPRAY | Freq: Two times a day (BID) | RESPIRATORY_TRACT | 6 refills | Status: DC
  Filled 2022-12-13: qty 10.2, 30d supply, fill #0
  Filled 2023-01-27 – 2023-02-06 (×2): qty 10.2, 30d supply, fill #1
  Filled 2023-03-03: qty 10.2, 30d supply, fill #2
  Filled 2023-05-12 – 2023-06-23 (×2): qty 10.2, 30d supply, fill #3
  Filled 2023-08-27: qty 10.2, 30d supply, fill #4
  Filled 2023-10-13 – 2023-10-28 (×2): qty 10.2, 30d supply, fill #5

## 2023-01-06 DIAGNOSIS — M25511 Pain in right shoulder: Secondary | ICD-10-CM | POA: Diagnosis not present

## 2023-01-09 ENCOUNTER — Other Ambulatory Visit: Payer: Self-pay | Admitting: Sports Medicine

## 2023-01-09 ENCOUNTER — Ambulatory Visit
Admission: RE | Admit: 2023-01-09 | Discharge: 2023-01-09 | Disposition: A | Discharge status: Home / Self Care | Payer: Medicare HMO | Source: Ambulatory Visit | Attending: Sports Medicine | Admitting: Sports Medicine

## 2023-01-09 DIAGNOSIS — M25511 Pain in right shoulder: Secondary | ICD-10-CM

## 2023-01-09 DIAGNOSIS — M19011 Primary osteoarthritis, right shoulder: Secondary | ICD-10-CM | POA: Diagnosis not present

## 2023-01-12 DIAGNOSIS — F411 Generalized anxiety disorder: Secondary | ICD-10-CM | POA: Diagnosis not present

## 2023-01-12 DIAGNOSIS — R69 Illness, unspecified: Secondary | ICD-10-CM | POA: Diagnosis not present

## 2023-01-14 ENCOUNTER — Ambulatory Visit
Admission: RE | Admit: 2023-01-14 | Discharge: 2023-01-14 | Disposition: A | Discharge status: Home / Self Care | Payer: Medicare HMO | Source: Ambulatory Visit | Attending: Family Medicine | Admitting: Family Medicine

## 2023-01-14 DIAGNOSIS — M8589 Other specified disorders of bone density and structure, multiple sites: Secondary | ICD-10-CM | POA: Diagnosis not present

## 2023-01-14 DIAGNOSIS — M8580 Other specified disorders of bone density and structure, unspecified site: Secondary | ICD-10-CM

## 2023-01-14 DIAGNOSIS — Z780 Asymptomatic menopausal state: Secondary | ICD-10-CM | POA: Diagnosis not present

## 2023-01-15 ENCOUNTER — Ambulatory Visit: Payer: Medicare HMO | Attending: Sports Medicine

## 2023-01-15 ENCOUNTER — Other Ambulatory Visit: Payer: Self-pay

## 2023-01-15 DIAGNOSIS — M6281 Muscle weakness (generalized): Secondary | ICD-10-CM | POA: Diagnosis not present

## 2023-01-15 DIAGNOSIS — M25511 Pain in right shoulder: Secondary | ICD-10-CM | POA: Diagnosis not present

## 2023-01-15 DIAGNOSIS — R600 Localized edema: Secondary | ICD-10-CM | POA: Diagnosis not present

## 2023-01-15 DIAGNOSIS — G8929 Other chronic pain: Secondary | ICD-10-CM | POA: Diagnosis not present

## 2023-01-15 NOTE — Therapy (Signed)
OUTPATIENT PHYSICAL THERAPY SHOULDER EVALUATION   Patient Name: Lisa Benjamin MRN: [id number] DOB:04/16/1956, 67 y.o., female Today's Date: 01/15/2023  END OF SESSION:  PT End of Session - 01/15/23 1042     Visit Number 1    Number of Visits 17    Date for PT Re-Evaluation 03/12/23    Authorization Type [employer] MCR    PT Start Time 1000    PT Stop Time 1035    PT Time Calculation (min) 35 min    Activity Tolerance Patient tolerated treatment well    Behavior During Therapy [facility] for tasks assessed/performed             Past Medical History:  Diagnosis Date   Microscopic hematuria 11/04   negative work up with Dr. Risa Grill   Osteopenia    Vitamin D deficiency    Past Surgical History:  Procedure Laterality Date   CESAREAN SECTION     x2   ROTATOR CUFF REPAIR Left 12/2005   TUBAL LIGATION Bilateral 1991   There are no problems to display for this patient.   PCP: Harlan Stains, MD  REFERRING PROVIDER: Inez Catalina, MD  REFERRING DIAG: [phone] (ICD-10-CM) - Pain in right shoulder   THERAPY DIAG:  Chronic right shoulder pain - Plan: PT plan of care cert/re-cert  Muscle weakness (generalized) - Plan: PT plan of care cert/re-cert  Localized edema - Plan: PT plan of care cert/re-cert  Rationale for Evaluation and Treatment: Rehabilitation  ONSET DATE: November 2023  SUBJECTIVE:                                                                                                                                                                                      SUBJECTIVE STATEMENT: Pt presents to PT with reports of acute on chronic R shoulder pain and discomfort after fall in November 2023. Received an injection last week and notes pain has decreased pain but notes weakness and limited ROM. Denies N/T down R UE, does promote pain in anterior shoulder made worse with reaching behind back. Pt notes that her husband has an advanced stage basal ganglia disease and she has  to perform a number of care giving duties that are uncomfortable for her R shoulder.   Hand dominance: Left  PERTINENT HISTORY: Osteopenia; L RTC Repair 2007  PAIN:  Are you having pain?  Yes: NPRS scale: 5/10 Worst: 10/10 Pain location: R anterior  Pain description: sharp Aggravating factors: reaching behind Relieving factors: injection, rest  PRECAUTIONS: None  WEIGHT BEARING RESTRICTIONS: No  FALLS:  Has patient fallen in last 6 months? Yes. Number of falls - one fall in November while carrying groceries  LIVING ENVIRONMENT: Lives with: lives with their family Lives in: House/apartment  OCCUPATION: Retired Therapist, sports  PLOF: Independent  PATIENT GOALS:decrease pain with ADL and when reaching behind back  OBJECTIVE:   DIAGNOSTIC FINDINGS:  CLINICAL DATA:  Pain   EXAM: RIGHT SHOULDER - 2+ VIEW   COMPARISON:  None Available.   FINDINGS: Mild AC joint degenerative changes. Mild glenohumeral degenerative changes without loss of joint space. No other bony or soft tissue abnormalities.   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  PATIENT SURVEYS:  FOTO: 72% function; 70% predicted  COGNITION: Overall cognitive status: Within functional limits for tasks assessed     SENSATION: WFL  POSTURE: Rounded shoulders and increased thoracic kyphosis  UPPER EXTREMITY ROM:   Active ROM Right eval Left eval  Shoulder flexion 130 WNL  Shoulder extension    Shoulder abduction 120 WNL  Shoulder adduction    Shoulder internal rotation 45 WNL  Shoulder external rotation 70 WNL  Elbow flexion    Elbow extension    Wrist flexion    Wrist extension    Wrist ulnar deviation    Wrist radial deviation    Wrist pronation    Wrist supination    (Blank rows = not tested)  UPPER EXTREMITY MMT:  MMT Right eval Left eval  Shoulder flexion    Shoulder extension    Shoulder abduction    Shoulder adduction    Shoulder internal rotation 4/5 p!  5/5  Shoulder external rotation 4/5  5/5  Middle trapezius    Lower trapezius    Elbow flexion    Elbow extension    Wrist flexion    Wrist extension    Wrist ulnar deviation    Wrist radial deviation    Wrist pronation    Wrist supination    Grip strength (lbs)    (Blank rows = not tested)  SHOULDER SPECIAL TESTS: Impingement tests: Neer impingement test: positive  and Hawkins/Kennedy impingement test: positive   PALPATION:  TTP to L subscapularis    TREATMENT: [facility] Adult PT Treatment:                                                DATE: 01/15/2023 Therapeutic Exercise: Row x 10 blue band R shoulder IR/ER isometric x 5 - 5" hold Supine dow flexion x 5 - AAROM Corner pec stretch x 20"   PATIENT EDUCATION: Education details: eval findings, FOTO, HEP, POC Person educated: Patient Education method: Explanation, Demonstration, and Handouts Education comprehension: verbalized understanding and returned demonstration  HOME EXERCISE PROGRAM: Access Code: [email or web address] URL: [email or web address] Date: 01/15/2023 Prepared by: Octavio Manns  Exercises - Standing Shoulder Row with Anchored Resistance  - 1 x daily - 7 x weekly - 3 sets - 10 reps - blue band hold - Standing Isometric Shoulder Internal Rotation with Towel Roll at Doorway  - 1 x daily - 7 x weekly - 2 sets - 10 reps - 5 sec hold - Standing Isometric Shoulder External Rotation with Doorway and Towel Roll  - 1 x daily - 7 x weekly - 2 sets - 10 reps - 5 sec hold - Supine Shoulder Flexion Extension AAROM with Dowel  - 1 x daily - 7 x weekly - 2 sets - 10 reps - 5 sec hold - Corner Pec Major Stretch  - 1 x daily -  7 x weekly - 2 reps - 20 sec hold  ASSESSMENT:  CLINICAL IMPRESSION: Patient is a 67 y.o. F who was seen today for physical therapy evaluation and treatment for acute on chronic R shoulder pain. Physical findings are consistent with MD impression as pt demonstrates decrease in R shoulder strength and  ROM. She would benefit from skilled PT services working on improving dynamic stabilization and manual therapy for decreasing shoulder pain and discomfort.    OBJECTIVE IMPAIRMENTS: decreased activity tolerance, decreased mobility, difficulty walking, decreased ROM, decreased strength, and pain.   ACTIVITY LIMITATIONS: carrying, lifting, dressing, reach over head, and caring for others  PARTICIPATION LIMITATIONS: driving, shopping, community activity, and yard work  PERSONAL FACTORS: Time since onset of injury/illness/exacerbation and 1 comorbidity: Osteopenia; L RTC Repair 2007  are also affecting patient's functional outcome.   REHAB POTENTIAL: Excellent  CLINICAL DECISION MAKING: Stable/uncomplicated  EVALUATION COMPLEXITY: Low   GOALS: Goals reviewed with patient? No  SHORT TERM GOALS: Target date: 02/05/2023   Pt will be compliant and knowledgeable with initial HEP for improved comfort and carryover Baseline: initial HEP given  Goal status: INITIAL  2.  Pt will self report right shoulder pain no greater than 6/10 for improved comfort and functional ability Baseline: 10/10 at worst Goal status: INITIAL   LONG TERM GOALS: Target date: 03/12/2023   1.  Pt will self report right shoulder pain no greater than 3/10 for improved comfort and functional ability Baseline: 10/10 at worst Goal status: INITIAL   2.  Pt will improve R shoulder flex/abd AROM to no less than 140 degrees for improved functional ability with home ADLs and community activity Baseline: see chart Goal status: INITIAL  3.  Pt will be able to perform care giving duties for husband not limited by R shoulder pain for improved comfort and function Baseline: unable Goal status: INITIAL  5.  Pt will improve R shoulder IR AROM to no less than 70 for improved comfort and function when reaching behind back Baseline: 45 Goal status: INITIAL  PLAN:  PT FREQUENCY: 1-2x/week  PT DURATION: 8 weeks  PLANNED  INTERVENTIONS: Therapeutic exercises, Therapeutic activity, Neuromuscular re-education, Balance training, Gait training, Patient/Family education, Self Care, Joint mobilization, Dry Needling, Electrical stimulation, Cryotherapy, Moist heat, Vasopneumatic device, Manual therapy, and Re-evaluation  PLAN FOR NEXT SESSION: assess HEP response, RTC and periscapular strength   Ward Chatters, PT 01/15/2023, 10:43 AM

## 2023-01-29 ENCOUNTER — Ambulatory Visit: Payer: Medicare HMO

## 2023-01-29 DIAGNOSIS — G8929 Other chronic pain: Secondary | ICD-10-CM | POA: Diagnosis not present

## 2023-01-29 DIAGNOSIS — M6281 Muscle weakness (generalized): Secondary | ICD-10-CM

## 2023-01-29 DIAGNOSIS — M25511 Pain in right shoulder: Secondary | ICD-10-CM | POA: Diagnosis not present

## 2023-01-29 DIAGNOSIS — R600 Localized edema: Secondary | ICD-10-CM | POA: Diagnosis not present

## 2023-01-29 NOTE — Therapy (Signed)
OUTPATIENT PHYSICAL THERAPY TREATMENT NOTE   Patient Name: Lisa Benjamin MRN: [id number] DOB:Oct 17, 1955, 67 y.o., female Today's Date: 01/29/2023  PCP: Laurann Montana, MD  REFERRING PROVIDER: Christena Deem, MD   END OF SESSION:   PT End of Session - 01/29/23 0827     Visit Number 2    Number of Visits 17    Date for PT Re-Evaluation 03/12/23    Authorization Type [employer] MCR    PT Start Time 0830    PT Stop Time 0910    PT Time Calculation (min) 40 min    Activity Tolerance Patient tolerated treatment well    Behavior During Therapy [facility] for tasks assessed/performed             Past Medical History:  Diagnosis Date   Microscopic hematuria 11/04   negative work up with Dr. Isabel Caprice   Osteopenia    Vitamin D deficiency    Past Surgical History:  Procedure Laterality Date   CESAREAN SECTION     x2   ROTATOR CUFF REPAIR Left 12/2005   TUBAL LIGATION Bilateral 1991   There are no problems to display for this patient.   REFERRING DIAG: M25.511 (ICD-10-CM) - Pain in right shoulder   THERAPY DIAG:  Chronic right shoulder pain  Muscle weakness (generalized)  Rationale for Evaluation and Treatment Rehabilitation  PERTINENT HISTORY: Osteopenia; L RTC Repair 2007   PRECAUTIONS: None   SUBJECTIVE:                                                                                                                                                                                      SUBJECTIVE STATEMENT:  Pt presents to PT noting that she is doing pretty well. Has been compliant with HEP with no adverse effect.    PAIN:  Are you having pain?  Yes: NPRS scale: 0/10 Worst: 10/10 Pain location: R anterior  Pain description: sharp Aggravating factors: reaching behind Relieving factors: injection, rest   OBJECTIVE: (objective measures completed at initial evaluation unless otherwise dated)  PATIENT SURVEYS:  FOTO: 72% function; 70% predicted   UPPER EXTREMITY ROM:     Active ROM Right eval Left eval  Shoulder flexion 130 WNL  Shoulder extension      Shoulder abduction 120 WNL  Shoulder adduction      Shoulder internal rotation 45 WNL  Shoulder external rotation 70 WNL  Elbow flexion      Elbow extension      Wrist flexion      Wrist extension      Wrist ulnar deviation      Wrist radial deviation      Wrist pronation  Wrist supination      (Blank rows = not tested)   UPPER EXTREMITY MMT:   MMT Right eval Left eval  Shoulder flexion      Shoulder extension      Shoulder abduction      Shoulder adduction      Shoulder internal rotation 4/5 p! 5/5  Shoulder external rotation 4/5  5/5  Middle trapezius      Lower trapezius      Elbow flexion      Elbow extension      Wrist flexion      Wrist extension      Wrist ulnar deviation      Wrist radial deviation      Wrist pronation      Wrist supination      Grip strength (lbs)      (Blank rows = not tested)               TREATMENT: [facility] Adult PT Treatment:                                                DATE: 01/29/2023 Therapeutic Exercise: UBE lvl 1.5 2'/2' while taking subjective Row 2x10 17# Shoulder ext 2x10 17#  R shoulder IR/ER isometric 2x10 - 5" hold R shoulder IR/ER 2x10 RTB  Supine dow flexion x 10 - AAROM Supine horizontal abd 2x10- GTB Seated bilateral ER 2x10 YTB Corner pec stretch x 30" Therapeutic Exercise: ER stretching to R shoulder AP mobs to R shoulder grade II  [facility] Adult PT Treatment:                                                DATE: 01/15/2023 Therapeutic Exercise: Row x 10 blue band R shoulder IR/ER isometric x 5 - 5" hold Supine dow flexion x 5 - AAROM Corner pec stretch x 20"    PATIENT EDUCATION: Education details: eval findings, FOTO, HEP, POC Person educated: Patient Education method: Explanation, Demonstration, and Handouts Education comprehension: verbalized understanding and returned demonstration   HOME EXERCISE  PROGRAM: Access Code: [email or web address] URL: [email or web address] Date: 01/15/2023 Prepared by: Edwinna Areola   Exercises - Standing Shoulder Row with Anchored Resistance  - 1 x daily - 7 x weekly - 3 sets - 10 reps - blue band hold - Standing Isometric Shoulder Internal Rotation with Towel Roll at Doorway  - 1 x daily - 7 x weekly - 2 sets - 10 reps - 5 sec hold - Standing Isometric Shoulder External Rotation with Doorway and Towel Roll  - 1 x daily - 7 x weekly - 2 sets - 10 reps - 5 sec hold - Supine Shoulder Flexion Extension AAROM with Dowel  - 1 x daily - 7 x weekly - 2 sets - 10 reps - 5 sec hold - Corner Pec Major Stretch  - 1 x daily - 7 x weekly - 2 reps - 20 sec hold   ASSESSMENT:   CLINICAL IMPRESSION: Pt was able to complete all prescribed exercises with no adverse effect. Therpay focused on improving periscapular and RTC strength in order to decrease pain and improve function. Pt continues to benefit from skilled PT services and will continue to be seen  and progressed as able.    OBJECTIVE IMPAIRMENTS: decreased activity tolerance, decreased mobility, difficulty walking, decreased ROM, decreased strength, and pain.    ACTIVITY LIMITATIONS: carrying, lifting, dressing, reach over head, and caring for others   PARTICIPATION LIMITATIONS: driving, shopping, community activity, and yard work   PERSONAL FACTORS: Time since onset of injury/illness/exacerbation and 1 comorbidity: Osteopenia; L RTC Repair 2007  are also affecting patient's functional outcome.     GOALS: Goals reviewed with patient? No   SHORT TERM GOALS: Target date: 02/05/2023   Pt will be compliant and knowledgeable with initial HEP for improved comfort and carryover Baseline: initial HEP given  Goal status: INITIAL   2.  Pt will self report right shoulder pain no greater than 6/10 for improved comfort and functional ability Baseline: 10/10 at worst Goal status: INITIAL    LONG TERM GOALS: Target  date: 03/12/2023   1.  Pt will self report right shoulder pain no greater than 3/10 for improved comfort and functional ability Baseline: 10/10 at worst Goal status: INITIAL    2.  Pt will improve R shoulder flex/abd AROM to no less than 140 degrees for improved functional ability with home ADLs and community activity Baseline: see chart Goal status: INITIAL   3.  Pt will be able to perform care giving duties for husband not limited by R shoulder pain for improved comfort and function Baseline: unable Goal status: INITIAL   5.  Pt will improve R shoulder IR AROM to no less than 70 for improved comfort and function when reaching behind back Baseline: 45 Goal status: INITIAL   PLAN:   PT FREQUENCY: 1-2x/week   PT DURATION: 8 weeks   PLANNED INTERVENTIONS: Therapeutic exercises, Therapeutic activity, Neuromuscular re-education, Balance training, Gait training, Patient/Family education, Self Care, Joint mobilization, Dry Needling, Electrical stimulation, Cryotherapy, Moist heat, Vasopneumatic device, Manual therapy, and Re-evaluation   PLAN FOR NEXT SESSION: assess HEP response, RTC and periscapular strength   Eloy End, PT 01/29/2023, 9:14 AM

## 2023-01-30 ENCOUNTER — Ambulatory Visit: Payer: Medicare HMO

## 2023-01-30 DIAGNOSIS — G8929 Other chronic pain: Secondary | ICD-10-CM | POA: Diagnosis not present

## 2023-01-30 DIAGNOSIS — M25511 Pain in right shoulder: Secondary | ICD-10-CM | POA: Diagnosis not present

## 2023-01-30 DIAGNOSIS — R600 Localized edema: Secondary | ICD-10-CM | POA: Diagnosis not present

## 2023-01-30 DIAGNOSIS — M6281 Muscle weakness (generalized): Secondary | ICD-10-CM | POA: Diagnosis not present

## 2023-01-30 NOTE — Therapy (Signed)
OUTPATIENT PHYSICAL THERAPY TREATMENT NOTE   Patient Name: Lisa Benjamin MRN: [id number] DOB:[birth year], 67 y.o., female Today's Date: 01/30/2023  PCP: Laurann Montana, MD  REFERRING PROVIDER: Christena Deem, MD   END OF SESSION:   PT End of Session - 01/30/23 0913     Visit Number 3    Number of Visits 17    Date for PT Re-Evaluation 03/12/23    Authorization Type Aetna [facility]    PT Start Time 0915    PT Stop Time 0955    PT Time Calculation (min) 40 min    Activity Tolerance Patient tolerated treatment well    Behavior During Therapy [facility] for tasks assessed/performed              Past Medical History:  Diagnosis Date   Microscopic hematuria 11/04   negative work up with Dr. Isabel Caprice   Osteopenia    Vitamin D deficiency    Past Surgical History:  Procedure Laterality Date   CESAREAN SECTION     x2   ROTATOR CUFF REPAIR Left 12/2005   TUBAL LIGATION Bilateral 1991   There are no problems to display for this patient.   REFERRING DIAG: M25.511 (ICD-10-CM) - Pain in right shoulder   THERAPY DIAG:  Chronic right shoulder pain  Muscle weakness (generalized)  Localized edema  Rationale for Evaluation and Treatment Rehabilitation  PERTINENT HISTORY: Osteopenia; L RTC Repair 2007   PRECAUTIONS: None   SUBJECTIVE:                                                                                                                                                                                      SUBJECTIVE STATEMENT:  Pt presents to PT with reports of continued improvement in R shoulder symptoms. Has been compliant with HEP with no adverse effect.    PAIN:  Are you having pain?  Yes: NPRS scale: 0/10 Worst: 10/10 Pain location: R anterior  Pain description: sharp Aggravating factors: reaching behind Relieving factors: injection, rest   OBJECTIVE: (objective measures completed at initial evaluation unless otherwise dated)  PATIENT SURVEYS:  FOTO: 72%  function; 70% predicted   UPPER EXTREMITY ROM:    Active ROM Right eval Left eval  Shoulder flexion 130 WNL  Shoulder extension      Shoulder abduction 120 WNL  Shoulder adduction      Shoulder internal rotation 45 WNL  Shoulder external rotation 70 WNL  Elbow flexion      Elbow extension      Wrist flexion      Wrist extension      Wrist ulnar deviation      Wrist radial deviation  Wrist pronation      Wrist supination      (Blank rows = not tested)   UPPER EXTREMITY MMT:   MMT Right eval Left eval  Shoulder flexion      Shoulder extension      Shoulder abduction      Shoulder adduction      Shoulder internal rotation 4/5 p! 5/5  Shoulder external rotation 4/5  5/5  Middle trapezius      Lower trapezius      Elbow flexion      Elbow extension      Wrist flexion      Wrist extension      Wrist ulnar deviation      Wrist radial deviation      Wrist pronation      Wrist supination      Grip strength (lbs)      (Blank rows = not tested)               TREATMENT: [facility] Adult PT Treatment:                                                DATE: 01/30/2023 Therapeutic Exercise: UBE lvl 1.5 2'/2' while taking subjective Row 2x10 20# Shoulder ext 2x10 20#  R shoulder IR/ER 2x10 3# Supine horizontal abd 2x15 GTB Supine serratus punch 2x10 2# each Supine shoulder flexion 2x10 2# Seated bilateral ER 2x15 GTB Corner pec stretch x 30"  [facility] Adult PT Treatment:                                                DATE: 01/29/2023 Therapeutic Exercise: UBE lvl 1.5 2'/2' while taking subjective Row 2x10 17# Shoulder ext 2x10 17#  R shoulder IR/ER isometric 2x10 - 5" hold R shoulder IR/ER 2x10 RTB  Supine dow flexion x 10 - AAROM Supine horizontal abd 2x10- GTB Seated bilateral ER 2x10 YTB Corner pec stretch x 30" Manual Therapy: ER stretching to R shoulder AP mobs to R shoulder grade II  [facility] Adult PT Treatment:                                                 DATE: 01/15/2023 Therapeutic Exercise: Row x 10 blue band R shoulder IR/ER isometric x 5 - 5" hold Supine dow flexion x 5 - AAROM Corner pec stretch x 20"    PATIENT EDUCATION: Education details: eval findings, FOTO, HEP, POC Person educated: Patient Education method: Explanation, Demonstration, and Handouts Education comprehension: verbalized understanding and returned demonstration   HOME EXERCISE PROGRAM: Access Code: [email or web address] URL: [email or web address] Date: 01/15/2023 Prepared by: Edwinna Areola   Exercises - Standing Shoulder Row with Anchored Resistance  - 1 x daily - 7 x weekly - 3 sets - 10 reps - blue band hold - Standing Isometric Shoulder Internal Rotation with Towel Roll at Doorway  - 1 x daily - 7 x weekly - 2 sets - 10 reps - 5 sec hold - Standing Isometric Shoulder External Rotation with Doorway and Towel Roll  - 1 x daily - 7  x weekly - 2 sets - 10 reps - 5 sec hold - Supine Shoulder Flexion Extension AAROM with Dowel  - 1 x daily - 7 x weekly - 2 sets - 10 reps - 5 sec hold - Corner Pec Major Stretch  - 1 x daily - 7 x weekly - 2 reps - 20 sec hold   ASSESSMENT:   CLINICAL IMPRESSION: Pt was able to complete all prescribed exercises with no adverse effect. Therpay focused on improving periscapular and RTC strength in order to decrease pain and improve function. Pt continues to progress well with therapy. Will continue to progress as able per POC.    OBJECTIVE IMPAIRMENTS: decreased activity tolerance, decreased mobility, difficulty walking, decreased ROM, decreased strength, and pain.    ACTIVITY LIMITATIONS: carrying, lifting, dressing, reach over head, and caring for others   PARTICIPATION LIMITATIONS: driving, shopping, community activity, and yard work   PERSONAL FACTORS: Time since onset of injury/illness/exacerbation and 1 comorbidity: Osteopenia; L RTC Repair 2007  are also affecting patient's functional outcome.     GOALS: Goals reviewed  with patient? No   SHORT TERM GOALS: Target date: 02/05/2023   Pt will be compliant and knowledgeable with initial HEP for improved comfort and carryover Baseline: initial HEP given  Goal status: INITIAL   2.  Pt will self report right shoulder pain no greater than 6/10 for improved comfort and functional ability Baseline: 10/10 at worst Goal status: INITIAL    LONG TERM GOALS: Target date: 03/12/2023   1.  Pt will self report right shoulder pain no greater than 3/10 for improved comfort and functional ability Baseline: 10/10 at worst Goal status: INITIAL    2.  Pt will improve R shoulder flex/abd AROM to no less than 140 degrees for improved functional ability with home ADLs and community activity Baseline: see chart Goal status: INITIAL   3.  Pt will be able to perform care giving duties for husband not limited by R shoulder pain for improved comfort and function Baseline: unable Goal status: INITIAL   5.  Pt will improve R shoulder IR AROM to no less than 70 for improved comfort and function when reaching behind back Baseline: 45 Goal status: INITIAL   PLAN:   PT FREQUENCY: 1-2x/week   PT DURATION: 8 weeks   PLANNED INTERVENTIONS: Therapeutic exercises, Therapeutic activity, Neuromuscular re-education, Balance training, Gait training, Patient/Family education, Self Care, Joint mobilization, Dry Needling, Electrical stimulation, Cryotherapy, Moist heat, Vasopneumatic device, Manual therapy, and Re-evaluation   PLAN FOR NEXT SESSION: assess HEP response, RTC and periscapular strength   Eloy End, PT 01/30/2023, 9:57 AM

## 2023-02-04 ENCOUNTER — Ambulatory Visit: Payer: Medicare HMO

## 2023-02-05 NOTE — Therapy (Signed)
OUTPATIENT PHYSICAL THERAPY TREATMENT NOTE   Patient Name: Lisa Benjamin MRN: [id number] DOB:[birth year], 67 y.o., female Today's Date: 02/06/2023  PCP: Laurann Montana, MD  REFERRING PROVIDER: Christena Deem, MD   END OF SESSION:   PT End of Session - 02/06/23 0915     Visit Number 4    Number of Visits 17    Date for PT Re-Evaluation 03/12/23    Authorization Type [employer] [facility]    PT Start Time 0915    PT Stop Time 0955    PT Time Calculation (min) 40 min    Activity Tolerance Patient tolerated treatment well    Behavior During Therapy [facility] for tasks assessed/performed               Past Medical History:  Diagnosis Date   Microscopic hematuria 11/04   negative work up with Dr. Isabel Caprice   Osteopenia    Vitamin D deficiency    Past Surgical History:  Procedure Laterality Date   CESAREAN SECTION     x2   ROTATOR CUFF REPAIR Left 12/2005   TUBAL LIGATION Bilateral 1991   There are no problems to display for this patient.   REFERRING DIAG: M25.511 (ICD-10-CM) - Pain in right shoulder   THERAPY DIAG:  Chronic right shoulder pain  Muscle weakness (generalized)  Rationale for Evaluation and Treatment Rehabilitation  PERTINENT HISTORY: Osteopenia; L RTC Repair 2007   PRECAUTIONS: None   SUBJECTIVE:                                                                                                                                                                                      SUBJECTIVE STATEMENT:  Pt presents to PT with no current R shoulder pain. Does note that her back is bothering her more with lifting her husband.    PAIN:  Are you having pain?  Yes: NPRS scale: 0/10 Worst: 10/10 Pain location: R anterior  Pain description: sharp Aggravating factors: reaching behind Relieving factors: injection, rest   OBJECTIVE: (objective measures completed at initial evaluation unless otherwise dated)  PATIENT SURVEYS:  FOTO: 72% function; 70% predicted    UPPER EXTREMITY ROM:    Active ROM Right eval Left eval  Shoulder flexion 130 WNL  Shoulder extension      Shoulder abduction 120 WNL  Shoulder adduction      Shoulder internal rotation 45 WNL  Shoulder external rotation 70 WNL  Elbow flexion      Elbow extension      Wrist flexion      Wrist extension      Wrist ulnar deviation      Wrist radial deviation  Wrist pronation      Wrist supination      (Blank rows = not tested)   UPPER EXTREMITY MMT:   MMT Right eval Left eval  Shoulder flexion      Shoulder extension      Shoulder abduction      Shoulder adduction      Shoulder internal rotation 4/5 p! 5/5  Shoulder external rotation 4/5  5/5  Middle trapezius      Lower trapezius      Elbow flexion      Elbow extension      Wrist flexion      Wrist extension      Wrist ulnar deviation      Wrist radial deviation      Wrist pronation      Wrist supination      Grip strength (lbs)      (Blank rows = not tested)               TREATMENT: [facility] Adult PT Treatment:                                                DATE: 02/06/2023 Therapeutic Exercise: UBE lvl 1.5 2'/2' while taking subjective Row 2x10 20# Shoulder ext 2x10 20# R shoulder IR/ER 2x10 3# Supine horizontal abd 2x15 GTB Supine serratus punch 2x10 2# R Seated bilateral ER 2x15 GTB Corner pec stretch x 45" Seated row 2x10 20# Lat pulldown x 10 20#  [facility] Adult PT Treatment:                                                DATE: 01/30/2023 Therapeutic Exercise: UBE lvl 1.5 2'/2' while taking subjective Row 2x10 20# Shoulder ext 2x10 20#  R shoulder IR/ER 2x10 3# Supine horizontal abd 2x15 GTB Supine serratus punch 2x10 2# each Supine shoulder flexion 2x10 2# Seated bilateral ER 2x15 GTB Corner pec stretch x 30"  [facility] Adult PT Treatment:                                                DATE: 01/29/2023 Therapeutic Exercise: UBE lvl 1.5 2'/2' while taking subjective Row 2x10 17# Shoulder ext  2x10 17#  R shoulder IR/ER isometric 2x10 - 5" hold R shoulder IR/ER 2x10 RTB  Supine dow flexion x 10 - AAROM Supine horizontal abd 2x10- GTB Seated bilateral ER 2x10 YTB Corner pec stretch x 30" Manual Therapy: ER stretching to R shoulder AP mobs to R shoulder grade II  [facility] Adult PT Treatment:                                                DATE: 01/15/2023 Therapeutic Exercise: Row x 10 blue band R shoulder IR/ER isometric x 5 - 5" hold Supine dow flexion x 5 - AAROM Corner pec stretch x 20"    PATIENT EDUCATION: Education details: eval findings, FOTO, HEP, POC Person educated: Patient Education  method: Explanation, Demonstration, and Handouts Education comprehension: verbalized understanding and returned demonstration   HOME EXERCISE PROGRAM: Access Code: [email or web address] URL: [email or web address] Date: 01/15/2023 Prepared by: Edwinna Areola   Exercises - Standing Shoulder Row with Anchored Resistance  - 1 x daily - 7 x weekly - 3 sets - 10 reps - blue band hold - Standing Isometric Shoulder Internal Rotation with Towel Roll at Doorway  - 1 x daily - 7 x weekly - 2 sets - 10 reps - 5 sec hold - Standing Isometric Shoulder External Rotation with Doorway and Towel Roll  - 1 x daily - 7 x weekly - 2 sets - 10 reps - 5 sec hold - Supine Shoulder Flexion Extension AAROM with Dowel  - 1 x daily - 7 x weekly - 2 sets - 10 reps - 5 sec hold - Corner Pec Major Stretch  - 1 x daily - 7 x weekly - 2 reps - 20 sec hold   ASSESSMENT:   CLINICAL IMPRESSION: Pt was able to complete all prescribed exercises with no adverse effect. Therpay focused on improving periscapular and RTC strength in order to decrease pain and improve function. She continues to progress really well with therapy, will assess goals next session and update HEP appropriately. Will continue to progress as able per POC.    OBJECTIVE IMPAIRMENTS: decreased activity tolerance, decreased mobility, difficulty walking,  decreased ROM, decreased strength, and pain.    ACTIVITY LIMITATIONS: carrying, lifting, dressing, reach over head, and caring for others   PARTICIPATION LIMITATIONS: driving, shopping, community activity, and yard work   PERSONAL FACTORS: Time since onset of injury/illness/exacerbation and 1 comorbidity: Osteopenia; L RTC Repair 2007  are also affecting patient's functional outcome.     GOALS: Goals reviewed with patient? No   SHORT TERM GOALS: Target date: 02/05/2023   Pt will be compliant and knowledgeable with initial HEP for improved comfort and carryover Baseline: initial HEP given  Goal status: INITIAL   2.  Pt will self report right shoulder pain no greater than 6/10 for improved comfort and functional ability Baseline: 10/10 at worst Goal status: INITIAL    LONG TERM GOALS: Target date: 03/12/2023   1.  Pt will self report right shoulder pain no greater than 3/10 for improved comfort and functional ability Baseline: 10/10 at worst Goal status: INITIAL    2.  Pt will improve R shoulder flex/abd AROM to no less than 140 degrees for improved functional ability with home ADLs and community activity Baseline: see chart Goal status: INITIAL   3.  Pt will be able to perform care giving duties for husband not limited by R shoulder pain for improved comfort and function Baseline: unable Goal status: INITIAL   5.  Pt will improve R shoulder IR AROM to no less than 70 for improved comfort and function when reaching behind back Baseline: 45 Goal status: INITIAL   PLAN:   PT FREQUENCY: 1-2x/week   PT DURATION: 8 weeks   PLANNED INTERVENTIONS: Therapeutic exercises, Therapeutic activity, Neuromuscular re-education, Balance training, Gait training, Patient/Family education, Self Care, Joint mobilization, Dry Needling, Electrical stimulation, Cryotherapy, Moist heat, Vasopneumatic device, Manual therapy, and Re-evaluation   PLAN FOR NEXT SESSION: assess HEP response, RTC and  periscapular strength   Eloy End, PT 02/06/2023, 10:00 AM

## 2023-02-06 ENCOUNTER — Other Ambulatory Visit (HOSPITAL_COMMUNITY): Payer: Self-pay

## 2023-02-06 ENCOUNTER — Ambulatory Visit: Payer: Medicare HMO

## 2023-02-06 DIAGNOSIS — M6281 Muscle weakness (generalized): Secondary | ICD-10-CM | POA: Diagnosis not present

## 2023-02-06 DIAGNOSIS — R600 Localized edema: Secondary | ICD-10-CM | POA: Diagnosis not present

## 2023-02-06 DIAGNOSIS — M25511 Pain in right shoulder: Secondary | ICD-10-CM | POA: Diagnosis not present

## 2023-02-06 DIAGNOSIS — G8929 Other chronic pain: Secondary | ICD-10-CM | POA: Diagnosis not present

## 2023-02-11 ENCOUNTER — Ambulatory Visit: Payer: Medicare HMO

## 2023-02-11 DIAGNOSIS — F411 Generalized anxiety disorder: Secondary | ICD-10-CM | POA: Diagnosis not present

## 2023-02-11 DIAGNOSIS — R600 Localized edema: Secondary | ICD-10-CM | POA: Diagnosis not present

## 2023-02-11 DIAGNOSIS — M25511 Pain in right shoulder: Secondary | ICD-10-CM | POA: Diagnosis not present

## 2023-02-11 DIAGNOSIS — M6281 Muscle weakness (generalized): Secondary | ICD-10-CM

## 2023-02-11 DIAGNOSIS — G8929 Other chronic pain: Secondary | ICD-10-CM

## 2023-02-11 NOTE — Therapy (Signed)
OUTPATIENT PHYSICAL THERAPY TREATMENT NOTE   Patient Name: Lisa Benjamin MRN: [id number] DOB:Jul 05, 1956, 67 y.o., female Today's Date: 02/11/2023  PCP: Laurann Montana, MD  REFERRING PROVIDER: Christena Deem, MD   END OF SESSION:   PT End of Session - 02/11/23 0914     Visit Number 5    Number of Visits 17    Date for PT Re-Evaluation 03/12/23    Authorization Type [employer] [facility]    PT Start Time 0915    PT Stop Time 0954    PT Time Calculation (min) 39 min    Activity Tolerance Patient tolerated treatment well    Behavior During Therapy [facility] for tasks assessed/performed                Past Medical History:  Diagnosis Date   Microscopic hematuria 11/04   negative work up with Dr. Isabel Caprice   Osteopenia    Vitamin D deficiency    Past Surgical History:  Procedure Laterality Date   CESAREAN SECTION     x2   ROTATOR CUFF REPAIR Left 12/2005   TUBAL LIGATION Bilateral 1991   There are no problems to display for this patient.   REFERRING DIAG: M25.511 (ICD-10-CM) - Pain in right shoulder   THERAPY DIAG:  Chronic right shoulder pain  Muscle weakness (generalized)  Rationale for Evaluation and Treatment Rehabilitation  PERTINENT HISTORY: Osteopenia; L RTC Repair 2007   PRECAUTIONS: None   SUBJECTIVE:                                                                                                                                                                                      SUBJECTIVE STATEMENT:  Pt presents to PT with reports of R shoulder soreness. Has been compliant with HEP, notes soreness with corner stretch.    PAIN:  Are you having pain?  Yes: NPRS scale: 2/10 Worst: 10/10 Pain location: R anterior  Pain description: sharp Aggravating factors: reaching behind Relieving factors: injection, rest   OBJECTIVE: (objective measures completed at initial evaluation unless otherwise dated)  PATIENT SURVEYS:  FOTO: 72% function; 70% predicted    UPPER EXTREMITY ROM:    Active ROM Right eval Right 02/11/23  Shoulder flexion 130 150  Shoulder extension      Shoulder abduction 120 170  Shoulder adduction      Shoulder internal rotation 45 80  Shoulder external rotation 70   Elbow flexion      Elbow extension      Wrist flexion      Wrist extension      Wrist ulnar deviation      Wrist radial deviation  Wrist pronation      Wrist supination      (Blank rows = not tested)   UPPER EXTREMITY MMT:   MMT Right eval Left eval  Shoulder flexion      Shoulder extension      Shoulder abduction      Shoulder adduction      Shoulder internal rotation 4/5 p! 5/5  Shoulder external rotation 4/5  5/5  Middle trapezius      Lower trapezius      Elbow flexion      Elbow extension      Wrist flexion      Wrist extension      Wrist ulnar deviation      Wrist radial deviation      Wrist pronation      Wrist supination      Grip strength (lbs)      (Blank rows = not tested)               TREATMENT: [facility] Adult PT Treatment:                                                DATE: 02/11/2023 Therapeutic Exercise: UBE lvl 1.5 2'/2' while taking subjective Seated low row 2x10 25# Row 2x10 20# Shoulder ext 2x10 20# R shoulder IR/ER 2x10 GTB Supine dow flexion 2x15 2# Supine dow chest press 2x10 2# Seated horizontal abd 2x15 TTB Seated bilateral ER 2x15 GTB  [facility] Adult PT Treatment:                                                DATE: 02/06/2023 Therapeutic Exercise: UBE lvl 1.5 2'/2' while taking subjective Row 2x10 20# Shoulder ext 2x10 20# R shoulder IR/ER 2x10 3# Supine horizontal abd 2x15 GTB Supine serratus punch 2x10 2# R Seated bilateral ER 2x15 GTB Corner pec stretch x 45" Seated row 2x10 20# Lat pulldown x 10 20#  [facility] Adult PT Treatment:                                                DATE: 01/30/2023 Therapeutic Exercise: UBE lvl 1.5 2'/2' while taking subjective Row 2x10 20# Shoulder ext 2x10  20#  R shoulder IR/ER 2x10 3# Supine horizontal abd 2x15 GTB Supine serratus punch 2x10 2# each Supine shoulder flexion 2x10 2# Seated bilateral ER 2x15 GTB Corner pec stretch x 30"  [facility] Adult PT Treatment:                                                DATE: 01/29/2023 Therapeutic Exercise: UBE lvl 1.5 2'/2' while taking subjective Row 2x10 17# Shoulder ext 2x10 17#  R shoulder IR/ER isometric 2x10 - 5" hold R shoulder IR/ER 2x10 RTB  Supine dow flexion x 10 - AAROM Supine horizontal abd 2x10- GTB Seated bilateral ER 2x10 YTB Corner pec stretch x 30" Manual Therapy: ER stretching to R shoulder AP mobs to R  shoulder grade II   PATIENT EDUCATION: Education details: continue HEP Person educated: Patient Education method: Explanation, Demonstration, and Handouts Education comprehension: verbalized understanding and returned demonstration   HOME EXERCISE PROGRAM: Access Code: [email or web address] URL: [email or web address] Date: 02/11/2023 Prepared by: Edwinna Areola  Exercises - Standing Shoulder Row with Anchored Resistance  - 1 x daily - 7 x weekly - 3 sets - 10 reps - blue band hold - Standing Isometric Shoulder Internal Rotation with Towel Roll at Doorway  - 1 x daily - 7 x weekly - 2 sets - 10 reps - 5 sec hold - Standing Isometric Shoulder External Rotation with Doorway and Towel Roll  - 1 x daily - 7 x weekly - 2 sets - 10 reps - 5 sec hold - Supine Shoulder Flexion Extension AAROM with Dowel  - 1 x daily - 7 x weekly - 2 sets - 10 reps - 5 sec hold - Corner Pec Major Stretch  - 1 x daily - 7 x weekly - 2 reps - 20 sec hold - Shoulder Internal Rotation with Resistance  - 1 x daily - 7 x weekly - 3 sets - 10 reps - green band hold - Shoulder External Rotation with Anchored Resistance  - 1 x daily - 7 x weekly - 3 sets - 10 reps - green band hold   ASSESSMENT:   CLINICAL IMPRESSION: Pt was able to complete all prescribed exercises with no adverse effect or incresae in  pain. Therpay once again focused on improving periscapular and RTC strength in order to decrease pain and improve function. She demonstrated greatly improved AROM compared to initial evaluation. HEP was updated for progression of RTC stretching. Decreased treatment frequency to 1x/wk. Will continue to progress as able per POC.    OBJECTIVE IMPAIRMENTS: decreased activity tolerance, decreased mobility, difficulty walking, decreased ROM, decreased strength, and pain.    ACTIVITY LIMITATIONS: carrying, lifting, dressing, reach over head, and caring for others   PARTICIPATION LIMITATIONS: driving, shopping, community activity, and yard work   PERSONAL FACTORS: Time since onset of injury/illness/exacerbation and 1 comorbidity: Osteopenia; L RTC Repair 2007  are also affecting patient's functional outcome.     GOALS: Goals reviewed with patient? No   SHORT TERM GOALS: Target date: 02/05/2023   Pt will be compliant and knowledgeable with initial HEP for improved comfort and carryover Baseline: initial HEP given  Goal status: MET   2.  Pt will self report right shoulder pain no greater than 6/10 for improved comfort and functional ability Baseline: 10/10 at worst Goal status: MET   LONG TERM GOALS: Target date: 03/12/2023   1.  Pt will self report right shoulder pain no greater than 3/10 for improved comfort and functional ability Baseline: 10/10 at worst Goal status: ONGOING    2.  Pt will improve R shoulder flex/abd AROM to no less than 140 degrees for improved functional ability with home ADLs and community activity Baseline: see chart Goal status: MET   3.  Pt will be able to perform care giving duties for husband not limited by R shoulder pain for improved comfort and function Baseline: unable Goal status: ONGOING   5.  Pt will improve R shoulder IR AROM to no less than 70 for improved comfort and function when reaching behind back Baseline: 45 Goal status: ONGOING   PLAN:    PT FREQUENCY: 1-2x/week   PT DURATION: 8 weeks   PLANNED INTERVENTIONS: Therapeutic exercises, Therapeutic activity, Neuromuscular re-education, Balance training, Gait training,  Patient/Family education, Self Care, Joint mobilization, Dry Needling, Electrical stimulation, Cryotherapy, Moist heat, Vasopneumatic device, Manual therapy, and Re-evaluation   PLAN FOR NEXT SESSION: assess HEP response, RTC and periscapular strength   Eloy End, PT 02/11/2023, 10:01 AM

## 2023-02-13 ENCOUNTER — Ambulatory Visit: Payer: Medicare HMO

## 2023-02-18 ENCOUNTER — Ambulatory Visit: Payer: Medicare HMO

## 2023-02-20 ENCOUNTER — Ambulatory Visit: Payer: Medicare HMO | Attending: Sports Medicine

## 2023-02-20 DIAGNOSIS — G8929 Other chronic pain: Secondary | ICD-10-CM | POA: Diagnosis not present

## 2023-02-20 DIAGNOSIS — M6281 Muscle weakness (generalized): Secondary | ICD-10-CM | POA: Insufficient documentation

## 2023-02-20 DIAGNOSIS — M25511 Pain in right shoulder: Secondary | ICD-10-CM | POA: Diagnosis not present

## 2023-02-20 DIAGNOSIS — R600 Localized edema: Secondary | ICD-10-CM | POA: Insufficient documentation

## 2023-02-20 NOTE — Therapy (Signed)
OUTPATIENT PHYSICAL THERAPY TREATMENT NOTE/DISCHARGE  PHYSICAL THERAPY DISCHARGE SUMMARY  Visits from Start of Care: 6  Current functional level related to goals / functional outcomes: See goals and objective   Remaining deficits: See goals and objective   Education / Equipment: HEP   Patient agrees to discharge. Patient goals were met. Patient is being discharged due to being pleased with the current functional level.   Patient Name: Lisa Benjamin MRN: [id number] DOB:April 08, 1956, 67 y.o., female Today's Date: 02/20/2023  PCP: Laurann Montana, MD  REFERRING PROVIDER: Christena Deem, MD   END OF SESSION:   PT End of Session - 02/20/23 0914     Visit Number 6    Number of Visits 17    Date for PT Re-Evaluation 03/12/23    Authorization Type [employer] MCR    PT Start Time 0915    PT Stop Time 0945    PT Time Calculation (min) 30 min    Activity Tolerance Patient tolerated treatment well    Behavior During Therapy [facility] for tasks assessed/performed                 Past Medical History:  Diagnosis Date   Microscopic hematuria 11/04   negative work up with Dr. Isabel Caprice   Osteopenia    Vitamin D deficiency    Past Surgical History:  Procedure Laterality Date   CESAREAN SECTION     x2   ROTATOR CUFF REPAIR Left 12/2005   TUBAL LIGATION Bilateral 1991   There are no problems to display for this patient.   REFERRING DIAG: M25.511 (ICD-10-CM) - Pain in right shoulder   THERAPY DIAG:  Chronic right shoulder pain  Muscle weakness (generalized)  Localized edema  Rationale for Evaluation and Treatment Rehabilitation  PERTINENT HISTORY: Osteopenia; L RTC Repair 2007   PRECAUTIONS: None   SUBJECTIVE:                                                                                                                                                                                      SUBJECTIVE STATEMENT:  Pt presents to PT with reports of continued R shoulder pain  and soreness    PAIN:  Are you having pain?  Yes: NPRS scale: 2/10 Worst: 10/10 Pain location: R anterior  Pain description: sharp Aggravating factors: reaching behind Relieving factors: injection, rest   OBJECTIVE: (objective measures completed at initial evaluation unless otherwise dated)  PATIENT SURVEYS:  FOTO: 72% function; 70% predicted   UPPER EXTREMITY ROM:    Active ROM Right eval Right 02/11/23 Right 02/20/23  Shoulder flexion 130 150 180  Shoulder extension       Shoulder abduction 120  170 180  Shoulder adduction       Shoulder internal rotation 45 80 80  Shoulder external rotation 70    Elbow flexion       Elbow extension       Wrist flexion       Wrist extension       Wrist ulnar deviation       Wrist radial deviation       Wrist pronation       Wrist supination       (Blank rows = not tested)   UPPER EXTREMITY MMT:   MMT Right eval Right 02/20/23  Shoulder flexion      Shoulder extension      Shoulder abduction      Shoulder adduction      Shoulder internal rotation 4/5 p! 5/5  Shoulder external rotation 4/5  5/5  Middle trapezius      Lower trapezius      Elbow flexion      Elbow extension      Wrist flexion      Wrist extension      Wrist ulnar deviation      Wrist radial deviation      Wrist pronation      Wrist supination      Grip strength (lbs)      (Blank rows = not tested)               TREATMENT: [facility] Adult PT Treatment:                                                DATE: 02/20/2023 Therapeutic Exercise: UBE lvl 1.5 2'/2' while taking subjective Row x 10 black band R shoulder IR/ER GTB 2x10 each Supine ER pec stretch with towel x 30" Therapeutic Activity: Assessment of tests/measure and goals for discharge   [facility] Adult PT Treatment:                                                DATE: 02/11/2023 Therapeutic Exercise: UBE lvl 1.5 2'/2' while taking subjective Seated low row 2x10 25# Row 2x10 20# Shoulder ext 2x10  20# R shoulder IR/ER 2x10 GTB Supine dow flexion 2x15 2# Supine dow chest press 2x10 2# Seated horizontal abd 2x15 TTB Seated bilateral ER 2x15 GTB  [facility] Adult PT Treatment:                                                DATE: 02/06/2023 Therapeutic Exercise: UBE lvl 1.5 2'/2' while taking subjective Row 2x10 20# Shoulder ext 2x10 20# R shoulder IR/ER 2x10 3# Supine horizontal abd 2x15 GTB Supine serratus punch 2x10 2# R Seated bilateral ER 2x15 GTB Corner pec stretch x 45" Seated row 2x10 20# Lat pulldown x 10 20#  [facility] Adult PT Treatment:  DATE: 01/30/2023 Therapeutic Exercise: UBE lvl 1.5 2'/2' while taking subjective Row 2x10 20# Shoulder ext 2x10 20#  R shoulder IR/ER 2x10 3# Supine horizontal abd 2x15 GTB Supine serratus punch 2x10 2# each Supine shoulder flexion 2x10 2# Seated bilateral ER 2x15 GTB Corner pec stretch x 30"  PATIENT EDUCATION: Education details: continue HEP Person educated: Patient Education method: Explanation, Demonstration, and Handouts Education comprehension: verbalized understanding and returned demonstration   HOME EXERCISE PROGRAM: Access Code: [email or web address] URL: [email or web address] Date: 02/11/2023 Prepared by: Edwinna Areola  Exercises - Standing Shoulder Row with Anchored Resistance  - 1 x daily - 7 x weekly - 3 sets - 10 reps - blue band hold - Standing Isometric Shoulder Internal Rotation with Towel Roll at Doorway  - 1 x daily - 7 x weekly - 2 sets - 10 reps - 5 sec hold - Standing Isometric Shoulder External Rotation with Doorway and Towel Roll  - 1 x daily - 7 x weekly - 2 sets - 10 reps - 5 sec hold - Supine Shoulder Flexion Extension AAROM with Dowel  - 1 x daily - 7 x weekly - 2 sets - 10 reps - 5 sec hold - Corner Pec Major Stretch  - 1 x daily - 7 x weekly - 2 reps - 20 sec hold - Shoulder Internal Rotation with Resistance  - 1 x daily - 7 x weekly - 3 sets - 10 reps -  green band hold - Shoulder External Rotation with Anchored Resistance  - 1 x daily - 7 x weekly - 3 sets - 10 reps - green band hold   ASSESSMENT:   CLINICAL IMPRESSION: Pt was able to complete all prescribed exercises and demonstrated knowledge of HEP with no adverse effect. Over the course of PT treatment she has progressed very well, improivng R shoulder strength and ROM to WNL. Unfortunately she continues to have variable pain with activity, HEP was modified to take up corner pec stretch that she noted was uncomfortable for more favorable positioning. She should continue to improve with HEP compliance and is being discharge at this time.    OBJECTIVE IMPAIRMENTS: decreased activity tolerance, decreased mobility, difficulty walking, decreased ROM, decreased strength, and pain.    ACTIVITY LIMITATIONS: carrying, lifting, dressing, reach over head, and caring for others   PARTICIPATION LIMITATIONS: driving, shopping, community activity, and yard work   PERSONAL FACTORS: Time since onset of injury/illness/exacerbation and 1 comorbidity: Osteopenia; L RTC Repair 2007  are also affecting patient's functional outcome.     GOALS: Goals reviewed with patient? No   SHORT TERM GOALS: Target date: 02/05/2023   Pt will be compliant and knowledgeable with initial HEP for improved comfort and carryover Baseline: initial HEP given  Goal status: MET   2.  Pt will self report right shoulder pain no greater than 6/10 for improved comfort and functional ability Baseline: 10/10 at worst Goal status: MET   LONG TERM GOALS: Target date: 03/12/2023   1.  Pt will self report right shoulder pain no greater than 3/10 for improved comfort and functional ability Baseline: 10/10 at worst Goal status: MOSTLY MET   2.  Pt will improve R shoulder flex/abd AROM to no less than 140 degrees for improved functional ability with home ADLs and community activity Baseline: see chart Goal status: MET   3.  Pt will  be able to perform care giving duties for husband not limited by R shoulder pain for improved comfort and function Baseline: unable Goal  status: MET   5.  Pt will improve R shoulder IR AROM to no less than 70 for improved comfort and function when reaching behind back Baseline: see ROM chart Goal status: MET   PLAN:   PT FREQUENCY: 1-2x/week   PT DURATION: 8 weeks   PLANNED INTERVENTIONS: Therapeutic exercises, Therapeutic activity, Neuromuscular re-education, Balance training, Gait training, Patient/Family education, Self Care, Joint mobilization, Dry Needling, Electrical stimulation, Cryotherapy, Moist heat, Vasopneumatic device, Manual therapy, and Re-evaluation   PLAN FOR NEXT SESSION: assess HEP response, RTC and periscapular strength   Eloy End, PT 02/20/2023, 9:51 AM

## 2023-03-14 DIAGNOSIS — F411 Generalized anxiety disorder: Secondary | ICD-10-CM | POA: Diagnosis not present

## 2023-03-18 DIAGNOSIS — M25511 Pain in right shoulder: Secondary | ICD-10-CM | POA: Diagnosis not present

## 2023-03-18 DIAGNOSIS — Z0000 Encounter for general adult medical examination without abnormal findings: Secondary | ICD-10-CM | POA: Diagnosis not present

## 2023-03-25 ENCOUNTER — Other Ambulatory Visit: Payer: Self-pay | Admitting: Sports Medicine

## 2023-03-25 DIAGNOSIS — M25511 Pain in right shoulder: Secondary | ICD-10-CM

## 2023-03-31 DIAGNOSIS — M545 Low back pain, unspecified: Secondary | ICD-10-CM | POA: Diagnosis not present

## 2023-03-31 DIAGNOSIS — M25511 Pain in right shoulder: Secondary | ICD-10-CM | POA: Diagnosis not present

## 2023-04-09 ENCOUNTER — Other Ambulatory Visit: Payer: Self-pay | Admitting: Orthopedic Surgery

## 2023-04-09 DIAGNOSIS — M5126 Other intervertebral disc displacement, lumbar region: Secondary | ICD-10-CM

## 2023-04-13 DIAGNOSIS — F411 Generalized anxiety disorder: Secondary | ICD-10-CM | POA: Diagnosis not present

## 2023-04-15 ENCOUNTER — Ambulatory Visit
Admission: RE | Admit: 2023-04-15 | Discharge: 2023-04-15 | Disposition: A | Discharge status: Home / Self Care | Payer: Medicare HMO | Source: Ambulatory Visit | Attending: Orthopedic Surgery | Admitting: Orthopedic Surgery

## 2023-04-15 ENCOUNTER — Ambulatory Visit
Admission: RE | Admit: 2023-04-15 | Discharge: 2023-04-15 | Disposition: A | Discharge status: Home / Self Care | Payer: Medicare HMO | Source: Ambulatory Visit | Attending: Sports Medicine | Admitting: Sports Medicine

## 2023-04-15 DIAGNOSIS — M5126 Other intervertebral disc displacement, lumbar region: Secondary | ICD-10-CM

## 2023-04-15 DIAGNOSIS — M25511 Pain in right shoulder: Secondary | ICD-10-CM

## 2023-04-15 DIAGNOSIS — M47816 Spondylosis without myelopathy or radiculopathy, lumbar region: Secondary | ICD-10-CM | POA: Diagnosis not present

## 2023-04-15 DIAGNOSIS — M7581 Other shoulder lesions, right shoulder: Secondary | ICD-10-CM | POA: Diagnosis not present

## 2023-04-15 DIAGNOSIS — M4316 Spondylolisthesis, lumbar region: Secondary | ICD-10-CM | POA: Diagnosis not present

## 2023-04-19 ENCOUNTER — Other Ambulatory Visit: Payer: Medicare HMO

## 2023-04-25 DIAGNOSIS — M25511 Pain in right shoulder: Secondary | ICD-10-CM | POA: Diagnosis not present

## 2023-05-14 DIAGNOSIS — F411 Generalized anxiety disorder: Secondary | ICD-10-CM | POA: Diagnosis not present

## 2023-05-22 ENCOUNTER — Other Ambulatory Visit (HOSPITAL_COMMUNITY): Payer: Self-pay

## 2023-06-14 DIAGNOSIS — F411 Generalized anxiety disorder: Secondary | ICD-10-CM | POA: Diagnosis not present

## 2023-06-20 DIAGNOSIS — Z6832 Body mass index (BMI) 32.0-32.9, adult: Secondary | ICD-10-CM | POA: Diagnosis not present

## 2023-06-20 DIAGNOSIS — M4316 Spondylolisthesis, lumbar region: Secondary | ICD-10-CM | POA: Diagnosis not present

## 2023-06-26 ENCOUNTER — Other Ambulatory Visit: Payer: Self-pay

## 2023-06-27 DIAGNOSIS — H269 Unspecified cataract: Secondary | ICD-10-CM | POA: Diagnosis not present

## 2023-06-27 DIAGNOSIS — Z8249 Family history of ischemic heart disease and other diseases of the circulatory system: Secondary | ICD-10-CM | POA: Diagnosis not present

## 2023-06-27 DIAGNOSIS — M4800 Spinal stenosis, site unspecified: Secondary | ICD-10-CM | POA: Diagnosis not present

## 2023-06-27 DIAGNOSIS — M545 Low back pain, unspecified: Secondary | ICD-10-CM | POA: Diagnosis not present

## 2023-06-27 DIAGNOSIS — M8580 Other specified disorders of bone density and structure, unspecified site: Secondary | ICD-10-CM | POA: Diagnosis not present

## 2023-06-27 DIAGNOSIS — Z809 Family history of malignant neoplasm, unspecified: Secondary | ICD-10-CM | POA: Diagnosis not present

## 2023-06-27 DIAGNOSIS — J45909 Unspecified asthma, uncomplicated: Secondary | ICD-10-CM | POA: Diagnosis not present

## 2023-06-27 DIAGNOSIS — E669 Obesity, unspecified: Secondary | ICD-10-CM | POA: Diagnosis not present

## 2023-06-27 DIAGNOSIS — I872 Venous insufficiency (chronic) (peripheral): Secondary | ICD-10-CM | POA: Diagnosis not present

## 2023-06-27 DIAGNOSIS — M1990 Unspecified osteoarthritis, unspecified site: Secondary | ICD-10-CM | POA: Diagnosis not present

## 2023-06-27 DIAGNOSIS — Z7951 Long term (current) use of inhaled steroids: Secondary | ICD-10-CM | POA: Diagnosis not present

## 2023-06-27 DIAGNOSIS — Z6832 Body mass index (BMI) 32.0-32.9, adult: Secondary | ICD-10-CM | POA: Diagnosis not present

## 2023-06-30 DIAGNOSIS — M25511 Pain in right shoulder: Secondary | ICD-10-CM | POA: Diagnosis not present

## 2023-07-10 DIAGNOSIS — Z1231 Encounter for screening mammogram for malignant neoplasm of breast: Secondary | ICD-10-CM | POA: Diagnosis not present

## 2023-07-14 DIAGNOSIS — F411 Generalized anxiety disorder: Secondary | ICD-10-CM | POA: Diagnosis not present

## 2023-07-16 DIAGNOSIS — E559 Vitamin D deficiency, unspecified: Secondary | ICD-10-CM | POA: Diagnosis not present

## 2023-07-16 DIAGNOSIS — N1831 Chronic kidney disease, stage 3a: Secondary | ICD-10-CM | POA: Diagnosis not present

## 2023-07-16 DIAGNOSIS — E785 Hyperlipidemia, unspecified: Secondary | ICD-10-CM | POA: Diagnosis not present

## 2023-08-07 DIAGNOSIS — M5416 Radiculopathy, lumbar region: Secondary | ICD-10-CM | POA: Diagnosis not present

## 2023-08-07 DIAGNOSIS — M5116 Intervertebral disc disorders with radiculopathy, lumbar region: Secondary | ICD-10-CM | POA: Diagnosis not present

## 2023-08-07 DIAGNOSIS — M4316 Spondylolisthesis, lumbar region: Secondary | ICD-10-CM | POA: Diagnosis not present

## 2023-08-14 DIAGNOSIS — F411 Generalized anxiety disorder: Secondary | ICD-10-CM | POA: Diagnosis not present

## 2023-08-29 DIAGNOSIS — Z6832 Body mass index (BMI) 32.0-32.9, adult: Secondary | ICD-10-CM | POA: Diagnosis not present

## 2023-08-29 DIAGNOSIS — M4316 Spondylolisthesis, lumbar region: Secondary | ICD-10-CM | POA: Diagnosis not present

## 2023-10-01 ENCOUNTER — Other Ambulatory Visit (HOSPITAL_COMMUNITY): Payer: Self-pay

## 2023-10-01 DIAGNOSIS — F419 Anxiety disorder, unspecified: Secondary | ICD-10-CM | POA: Diagnosis not present

## 2023-10-01 DIAGNOSIS — F439 Reaction to severe stress, unspecified: Secondary | ICD-10-CM | POA: Diagnosis not present

## 2023-10-01 DIAGNOSIS — I129 Hypertensive chronic kidney disease with stage 1 through stage 4 chronic kidney disease, or unspecified chronic kidney disease: Secondary | ICD-10-CM | POA: Diagnosis not present

## 2023-10-01 MED ORDER — DIAZEPAM 2 MG PO TABS
1.0000 mg | ORAL_TABLET | Freq: Every day | ORAL | 0 refills | Status: DC | PRN
  Filled 2023-10-01: qty 10, 10d supply, fill #0

## 2023-10-01 MED ORDER — AMLODIPINE BESYLATE 2.5 MG PO TABS
2.5000 mg | ORAL_TABLET | Freq: Every day | ORAL | 5 refills | Status: DC
  Filled 2023-10-01: qty 30, 30d supply, fill #0
  Filled 2023-10-26: qty 30, 30d supply, fill #1
  Filled 2023-12-20: qty 30, 30d supply, fill #2

## 2023-10-13 ENCOUNTER — Ambulatory Visit (INDEPENDENT_AMBULATORY_CARE_PROVIDER_SITE_OTHER): Payer: Medicare HMO

## 2023-10-13 ENCOUNTER — Encounter: Payer: Self-pay | Admitting: Emergency Medicine

## 2023-10-13 ENCOUNTER — Other Ambulatory Visit: Payer: Self-pay

## 2023-10-13 ENCOUNTER — Ambulatory Visit
Admission: EM | Admit: 2023-10-13 | Discharge: 2023-10-13 | Disposition: A | Discharge status: Home / Self Care | Payer: Medicare HMO

## 2023-10-13 DIAGNOSIS — M25531 Pain in right wrist: Secondary | ICD-10-CM

## 2023-10-13 DIAGNOSIS — M8580 Other specified disorders of bone density and structure, unspecified site: Secondary | ICD-10-CM | POA: Diagnosis not present

## 2023-10-13 DIAGNOSIS — Z1211 Encounter for screening for malignant neoplasm of colon: Secondary | ICD-10-CM | POA: Insufficient documentation

## 2023-10-13 DIAGNOSIS — M4316 Spondylolisthesis, lumbar region: Secondary | ICD-10-CM | POA: Insufficient documentation

## 2023-10-13 DIAGNOSIS — Z800 Family history of malignant neoplasm of digestive organs: Secondary | ICD-10-CM | POA: Insufficient documentation

## 2023-10-13 MED ORDER — PREDNISONE 20 MG PO TABS
40.0000 mg | ORAL_TABLET | Freq: Every day | ORAL | 0 refills | Status: AC
Start: 2023-10-13 — End: 2023-10-18

## 2023-10-13 NOTE — ED Triage Notes (Signed)
Pt reports R wrist pain x2 weeks. Pt reports reaching into back seat of her car and stumbling over car seat which lead to her hitting her R wrist against the metal car frame. Pt reports constant aching, burning pain since the incident. She has tried resting it with no relief. Pt reports minimal swelling to the area during the first few days.  Pt can move wrist through ROM but states it is more painful especially with pronation.

## 2023-10-14 ENCOUNTER — Other Ambulatory Visit: Payer: Self-pay

## 2023-10-20 DIAGNOSIS — H25813 Combined forms of age-related cataract, bilateral: Secondary | ICD-10-CM | POA: Diagnosis not present

## 2023-10-20 DIAGNOSIS — H0288A Meibomian gland dysfunction right eye, upper and lower eyelids: Secondary | ICD-10-CM | POA: Diagnosis not present

## 2023-10-20 DIAGNOSIS — H04123 Dry eye syndrome of bilateral lacrimal glands: Secondary | ICD-10-CM | POA: Diagnosis not present

## 2023-10-20 DIAGNOSIS — H1045 Other chronic allergic conjunctivitis: Secondary | ICD-10-CM | POA: Diagnosis not present

## 2023-10-20 DIAGNOSIS — H0288B Meibomian gland dysfunction left eye, upper and lower eyelids: Secondary | ICD-10-CM | POA: Diagnosis not present

## 2023-10-24 ENCOUNTER — Other Ambulatory Visit (HOSPITAL_COMMUNITY): Payer: Self-pay

## 2023-10-28 ENCOUNTER — Other Ambulatory Visit (HOSPITAL_COMMUNITY): Payer: Self-pay

## 2023-11-14 ENCOUNTER — Other Ambulatory Visit (HOSPITAL_COMMUNITY): Payer: Self-pay

## 2023-11-14 DIAGNOSIS — R051 Acute cough: Secondary | ICD-10-CM | POA: Diagnosis not present

## 2023-11-14 DIAGNOSIS — B9689 Other specified bacterial agents as the cause of diseases classified elsewhere: Secondary | ICD-10-CM | POA: Diagnosis not present

## 2023-11-14 DIAGNOSIS — J019 Acute sinusitis, unspecified: Secondary | ICD-10-CM | POA: Diagnosis not present

## 2023-11-14 MED ORDER — GUAIFENESIN-CODEINE 100-10 MG/5ML PO SOLN
5.0000 mL | Freq: Four times a day (QID) | ORAL | 0 refills | Status: DC | PRN
  Filled 2023-11-14: qty 100, 5d supply, fill #0

## 2023-11-14 MED ORDER — AMOXICILLIN-POT CLAVULANATE 875-125 MG PO TABS
1.0000 | ORAL_TABLET | Freq: Two times a day (BID) | ORAL | 0 refills | Status: DC
  Filled 2023-11-14: qty 20, 10d supply, fill #0

## 2023-11-21 DIAGNOSIS — R062 Wheezing: Secondary | ICD-10-CM | POA: Diagnosis not present

## 2023-11-21 DIAGNOSIS — Z03818 Encounter for observation for suspected exposure to other biological agents ruled out: Secondary | ICD-10-CM | POA: Diagnosis not present

## 2023-11-21 DIAGNOSIS — J4541 Moderate persistent asthma with (acute) exacerbation: Secondary | ICD-10-CM | POA: Diagnosis not present

## 2023-11-21 DIAGNOSIS — J4540 Moderate persistent asthma, uncomplicated: Secondary | ICD-10-CM | POA: Diagnosis not present

## 2023-11-21 DIAGNOSIS — Z6832 Body mass index (BMI) 32.0-32.9, adult: Secondary | ICD-10-CM | POA: Diagnosis not present

## 2023-12-16 DIAGNOSIS — I83812 Varicose veins of left lower extremities with pain: Secondary | ICD-10-CM | POA: Diagnosis not present

## 2023-12-16 DIAGNOSIS — M79662 Pain in left lower leg: Secondary | ICD-10-CM | POA: Diagnosis not present

## 2023-12-16 DIAGNOSIS — R252 Cramp and spasm: Secondary | ICD-10-CM | POA: Diagnosis not present

## 2023-12-16 DIAGNOSIS — M7989 Other specified soft tissue disorders: Secondary | ICD-10-CM | POA: Diagnosis not present

## 2023-12-16 DIAGNOSIS — I872 Venous insufficiency (chronic) (peripheral): Secondary | ICD-10-CM | POA: Diagnosis not present

## 2023-12-30 ENCOUNTER — Other Ambulatory Visit: Payer: Self-pay

## 2023-12-30 ENCOUNTER — Other Ambulatory Visit (HOSPITAL_COMMUNITY): Payer: Self-pay

## 2023-12-30 DIAGNOSIS — N1831 Chronic kidney disease, stage 3a: Secondary | ICD-10-CM | POA: Diagnosis not present

## 2023-12-30 DIAGNOSIS — I129 Hypertensive chronic kidney disease with stage 1 through stage 4 chronic kidney disease, or unspecified chronic kidney disease: Secondary | ICD-10-CM | POA: Diagnosis not present

## 2023-12-30 DIAGNOSIS — F419 Anxiety disorder, unspecified: Secondary | ICD-10-CM | POA: Diagnosis not present

## 2023-12-30 DIAGNOSIS — E669 Obesity, unspecified: Secondary | ICD-10-CM | POA: Diagnosis not present

## 2023-12-30 DIAGNOSIS — F5101 Primary insomnia: Secondary | ICD-10-CM | POA: Diagnosis not present

## 2023-12-30 MED ORDER — AMLODIPINE BESYLATE 5 MG PO TABS
5.0000 mg | ORAL_TABLET | Freq: Every day | ORAL | 5 refills | Status: DC
  Filled 2023-12-30: qty 30, 30d supply, fill #0

## 2023-12-30 MED ORDER — TRAZODONE HCL 50 MG PO TABS
25.0000 mg | ORAL_TABLET | Freq: Every evening | ORAL | 5 refills | Status: AC | PRN
  Filled 2023-12-30: qty 30, 15d supply, fill #0
  Filled 2024-05-20 – 2024-06-10 (×2): qty 30, 15d supply, fill #1

## 2024-01-12 DIAGNOSIS — F411 Generalized anxiety disorder: Secondary | ICD-10-CM | POA: Diagnosis not present

## 2024-02-03 ENCOUNTER — Other Ambulatory Visit: Payer: Self-pay | Admitting: Neurological Surgery

## 2024-02-06 ENCOUNTER — Encounter: Payer: Self-pay | Admitting: Cardiology

## 2024-02-10 ENCOUNTER — Encounter (HOSPITAL_COMMUNITY): Payer: Self-pay

## 2024-02-10 NOTE — Progress Notes (Signed)
 Surgical Instructions   Your procedure is scheduled on Thursday, 02/19/24. Report to [facility] Main Entrance "A" at 10 A.M., then check in with the Admitting office. Any questions or running late day of surgery: call [phone]  Questions prior to your surgery date: call [phone], Monday-Friday, 8am-4pm. If you experience any cold or flu symptoms such as cough, fever, chills, shortness of breath, etc. between now and your scheduled surgery, please notify us  at the above number.     Remember:  Do not eat or drink after midnight the night before your surgery-Wednesday.    Take these medicines the morning of surgery with A SIP OF WATER: amLODipine  (NORVASC )   May take these medicines IF NEEDED: acetaminophen (TYLENOL)  albuterol  (VENTOLIN  HFA) SYMBICORT  INHALER FLONASE  NASAL SPRAY SYSTANE EYE DROPS   One week prior to surgery, STOP taking any Aspirin (unless otherwise instructed by your surgeon) Aleve, Naproxen, Ibuprofen, Motrin, Advil, Goody's, BC's, all herbal medications, fish oil, and non-prescription vitamins.                     Do NOT Smoke (Tobacco/Vaping) for 24 hours prior to your procedure.  If you use a CPAP at night, you may bring your mask/headgear for your overnight stay.   You will be asked to remove any contacts, glasses, piercing's, hearing aid's, dentures/partials prior to surgery. Please bring cases for these items if needed.    Patients discharged the day of surgery will not be allowed to drive home, and someone needs to stay with them for 24 hours.  SURGICAL WAITING ROOM VISITATION Patients may have no more than 2 support people in the waiting area - these visitors may rotate.   Pre-op nurse will coordinate an appropriate time for 1 ADULT support person, who may not rotate, to accompany patient in pre-op.  Children under the age of 47 must have an adult with them who is not the patient and must remain in the main waiting area with an adult.  If the  patient needs to stay at the hospital during part of their recovery, the visitor guidelines for inpatient rooms apply.  Please refer to the [facility] website for the visitor guidelines for any additional information.   If you received a COVID test during your pre-op visit  it is requested that you wear a mask when out in public, stay away from anyone that may not be feeling well and notify your surgeon if you develop symptoms. If you have been in contact with anyone that has tested positive in the last 10 days please notify you surgeon.      Pre-operative 5 CHG Bathing Instructions   You can play a key role in reducing the risk of infection after surgery. Your skin needs to be as free of germs as possible. You can reduce the number of germs on your skin by washing with CHG (chlorhexidine gluconate) soap before surgery. CHG is an antiseptic soap that kills germs and continues to kill germs even after washing.   DO NOT use if you have an allergy to chlorhexidine/CHG or antibacterial soaps. If your skin becomes reddened or irritated, stop using the CHG and notify one of our RNs at [phone].   Please shower with the CHG soap starting 4 days before surgery using the following schedule:     Please keep in mind the following:  DO NOT shave, including legs and underarms, starting the day of your first shower.   You may shave your  face at any point before/day of surgery.  Place clean sheets on your bed the day you start using CHG soap. Use a clean washcloth (not used since being washed) for each shower. DO NOT sleep with pets once you start using the CHG.   CHG Shower Instructions:  Wash your face and private area with normal soap. If you choose to wash your hair, wash first with your normal shampoo.  After you use shampoo/soap, rinse your hair and body thoroughly to remove shampoo/soap residue.  Turn the water OFF and apply about 3 tablespoons (45 ml) of CHG soap to a CLEAN washcloth.   Apply CHG soap ONLY FROM YOUR NECK DOWN TO YOUR TOES (washing for 3-5 minutes)  DO NOT use CHG soap on face, private areas, open wounds, or sores.  Pay special attention to the area where your surgery is being performed.  If you are having back surgery, having someone wash your back for you may be helpful. Wait 2 minutes after CHG soap is applied, then you may rinse off the CHG soap.  Pat dry with a clean towel  Put on clean clothes/pajamas   If you choose to wear lotion, please use ONLY the CHG-compatible lotions that are listed below.  Additional instructions for the day of surgery: DO NOT APPLY any lotions, deodorants, cologne, or perfumes.   Do not bring valuables to the hospital. [facility] is not responsible for any belongings/valuables. Do not wear nail polish, gel polish, artificial nails, or any other type of covering on natural nails (fingers and toes) Do not wear jewelry or makeup Put on clean/comfortable clothes.  Please brush your teeth.  Ask your nurse before applying any prescription medications to the skin.     CHG Compatible Lotions   Aveeno Moisturizing lotion  Cetaphil Moisturizing Cream  Cetaphil Moisturizing Lotion  Clairol Herbal Essence Moisturizing Lotion, Dry Skin  Clairol Herbal Essence Moisturizing Lotion, Extra Dry Skin  Clairol Herbal Essence Moisturizing Lotion, Normal Skin  Curel Age Defying Therapeutic Moisturizing Lotion with Alpha Hydroxy  Curel Extreme Care Body Lotion  Curel Soothing Hands Moisturizing Hand Lotion  Curel Therapeutic Moisturizing Cream, Fragrance-Free  Curel Therapeutic Moisturizing Lotion, Fragrance-Free  Curel Therapeutic Moisturizing Lotion, Original Formula  Eucerin Daily Replenishing Lotion  Eucerin Dry Skin Therapy Plus Alpha Hydroxy Crme  Eucerin Dry Skin Therapy Plus Alpha Hydroxy Lotion  Eucerin Original Crme  Eucerin Original Lotion  Eucerin Plus Crme Eucerin Plus Lotion  Eucerin TriLipid Replenishing  Lotion  Keri Anti-Bacterial Hand Lotion  Keri Deep Conditioning Original Lotion Dry Skin Formula Softly Scented  Keri Deep Conditioning Original Lotion, Fragrance Free Sensitive Skin Formula  Keri Lotion Fast Absorbing Fragrance Free Sensitive Skin Formula  Keri Lotion Fast Absorbing Softly Scented Dry Skin Formula  Keri Original Lotion  Keri Skin Renewal Lotion Keri Silky Smooth Lotion  Keri Silky Smooth Sensitive Skin Lotion  Nivea Body Creamy Conditioning Oil  Nivea Body Extra Enriched Lotion  Nivea Body Original Lotion  Nivea Body Sheer Moisturizing Lotion Nivea Crme  Nivea Skin Firming Lotion  NutraDerm 30 Skin Lotion  NutraDerm Skin Lotion  NutraDerm Therapeutic Skin Cream  NutraDerm Therapeutic Skin Lotion  ProShield Protective Hand Cream  Provon moisturizing lotion  Please read over the following fact sheets that you were given.

## 2024-02-11 ENCOUNTER — Other Ambulatory Visit: Payer: Self-pay

## 2024-02-11 ENCOUNTER — Encounter (HOSPITAL_COMMUNITY): Payer: Self-pay

## 2024-02-11 ENCOUNTER — Encounter (HOSPITAL_COMMUNITY)
Admission: RE | Admit: 2024-02-11 | Discharge: 2024-02-11 | Disposition: A | Discharge status: Home / Self Care | Source: Ambulatory Visit | Attending: Neurological Surgery | Admitting: Neurological Surgery

## 2024-02-11 VITALS — BP 144/51 | HR 60 | Temp 98.3°F | Resp 17 | Ht 62.0 in | Wt 167.0 lb

## 2024-02-11 DIAGNOSIS — F411 Generalized anxiety disorder: Secondary | ICD-10-CM | POA: Diagnosis not present

## 2024-02-11 DIAGNOSIS — Z01818 Encounter for other preprocedural examination: Secondary | ICD-10-CM | POA: Diagnosis not present

## 2024-02-11 DIAGNOSIS — I493 Ventricular premature depolarization: Secondary | ICD-10-CM | POA: Diagnosis not present

## 2024-02-11 DIAGNOSIS — I10 Essential (primary) hypertension: Secondary | ICD-10-CM | POA: Diagnosis not present

## 2024-02-11 DIAGNOSIS — F419 Anxiety disorder, unspecified: Secondary | ICD-10-CM | POA: Insufficient documentation

## 2024-02-11 DIAGNOSIS — M4316 Spondylolisthesis, lumbar region: Secondary | ICD-10-CM | POA: Insufficient documentation

## 2024-02-11 HISTORY — DX: Essential (primary) hypertension: I10

## 2024-02-11 HISTORY — DX: Anxiety disorder, unspecified: F41.9

## 2024-02-11 HISTORY — DX: Other complications of anesthesia, initial encounter: T88.59XA

## 2024-02-11 HISTORY — DX: Other specified postprocedural states: Z98.890

## 2024-02-11 LAB — CBC
HCT: 38.6 % (ref 36.0–46.0)
Hemoglobin: 12.4 g/dL (ref 12.0–15.0)
MCH: 29.2 pg (ref 26.0–34.0)
MCHC: 32.1 g/dL (ref 30.0–36.0)
MCV: 91 fL (ref 80.0–100.0)
Platelets: 322 10*3/uL (ref 150–400)
RBC: 4.24 MIL/uL (ref 3.87–5.11)
RDW: 14 % (ref 11.5–15.5)
WBC: 4.9 10*3/uL (ref 4.0–10.5)
nRBC: 0 % (ref 0.0–0.2)

## 2024-02-11 LAB — TYPE AND SCREEN
ABO/RH(D): O POS
Antibody Screen: NEGATIVE

## 2024-02-11 LAB — SURGICAL PCR SCREEN
MRSA, PCR: NEGATIVE
Staphylococcus aureus: NEGATIVE

## 2024-02-11 LAB — BASIC METABOLIC PANEL WITH GFR
Anion gap: 6 (ref 5–15)
BUN: 14 mg/dL (ref 8–23)
CO2: 27 mmol/L (ref 22–32)
Calcium: 9.4 mg/dL (ref 8.9–10.3)
Chloride: 107 mmol/L (ref 98–111)
Creatinine, Ser: 1.11 mg/dL — ABNORMAL HIGH (ref 0.44–1.00)
GFR, Estimated: 54 mL/min — ABNORMAL LOW (ref >60)
Glucose, Bld: 96 mg/dL (ref 70–99)
Potassium: 4.4 mmol/L (ref 3.5–5.1)
Sodium: 140 mmol/L (ref 135–145)

## 2024-02-11 NOTE — Progress Notes (Addendum)
 PCP - Dr. Victorio Grave Cardiologist - Dr. Peter Swaz[place]. Patient states she is to notify the office when PAT appointment is complete and they will look at result for cardiac clearance.   PPM/ICD - denies Device Orders - na Rep Notified - na  Chest x-ray - na EKG - PAT, 02/11/2024 Stress Test -  ECHO - 02/08/2020 Cardiac Cath -   Sleep Study - denies CPAP - na  Non-diabetic  Blood Thinner Instructions: denies Aspirin Instructions: denies  ERAS Protcol - NPO  Anesthesia review: Yes. HTN, need cardiac clearance  Patient denies shortness of breath, fever, cough and chest pain at PAT appointment   All instructions explained to the patient, with a verbal understanding of the material. Patient agrees to go over the instructions while at home for a better understanding. Patient also instructed to self quarantine after being tested for COVID-19. The opportunity to ask questions was provided.

## 2024-02-12 ENCOUNTER — Telehealth: Payer: Self-pay

## 2024-02-12 NOTE — Progress Notes (Signed)
 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  History includes never smoker, post-operative N/V, HTN, anxiety.   She had previous cardiology evaluation by Dr. Swaziland, initially in February 2019 for abnormal EKG showing RBBB, as well has junctional rhythm on 11/11/17 tracing. She also reported history of "functional murmur." 24 Hour Holter monitor in 11/2017 showed SR with no junctional bradycardia, rare PACs, PRVs. She was seen again in 2021 following ED visit for palpitations. PVCs noted on ECG. She was stated on Toprol  as needed.  Echo showed LVEF 60-65%, no regional wall motion abnormalities, mild LVH, normal diastolic parameters, normal RV function, normal PASP, trivial MR.  Last visit was on 03/27/22 for follow-up palpitations. Reported only rare occurrence. She had recent asthma exacerbation, so he advised she could avoid b-blocker since palpitation overall controlled. No symptoms related to RBBB. As needed follow-up recommended.   It looks like patient may have contacted Dr. Christophe Cram office regarding surgery plans. Will leave chart for follow-up.   VS: BP (!) 144/51   Pulse 60   Temp 36.8 C   Resp 17   Ht 5\' 2"  (1.575 m)   Wt 75.8 kg   LMP 06/14/2010 (Approximate)   SpO2 100%   BMI 30.54 kg/m    PROVIDERS: Victorio Grave, MD is PCP  [place], Peter, MD is cardiologist   LABS: Labs reviewed: Acceptable for surgery. (all labs ordered are listed, but only abnormal results are displayed)  Labs Reviewed  BASIC METABOLIC PANEL WITH GFR - Abnormal; Notable for the following components:       Result Value   Creatinine, Ser 1.11 (*)    GFR, Estimated 54 (*)    All other components within normal limits  SURGICAL PCR SCREEN  CBC  TYPE AND SCREEN    PFTs 04/20/21: FVC 2.77 (122%), post 2.67 (117%). FEV1 2.51 (142%), post 2.41 (136%). DLCO unc/cor 22.98 (124%).   IMAGES: MRI L-spine 04/15/23: IMPRESSION: 1. 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 The appearance is quite similar to the study of December 2022. The most common etiology would be chronic CSF leak. 2. L3-4: Chronic facet arthropathy with 5 mm of anterolisthesis. Shallow protrusion of the disc. Severe spinal stenosis at this level that could cause neural compression on either or both sides. Edematous facet arthritis at this level which could be a cause of back pain or referred facet syndrome pain. 3. L4-5: Mild disc bulge. Mild facet degeneration and hypertrophy. No compressive stenosis. 4. L5-S1: Mild facet osteoarthritis. No compressive stenosis. Thecal sac is displaced towards the left, due to a more prominent region of subdural fluid accumulation, similar to the prior exam.     EKG: 02/11/24: Sinus bradycardia at 47 bpm Right bundle branch block Left posterior fasicular block Abnormal ECG When compared with ECG of 18-Jan-2020 09:36, PREVIOUS ECG IS PRESENT Confirmed by Gaylyn Keas ([id number]) on 02/11/2024 3:45:53 PM - Rate is slower when compared to 01/18/20 tracing.   CV: Echo 02/08/20: IMPRESSIONS   1. Left ventricular ejection fraction, by estimation, is 60 to 65%. The  left ventricle has normal function. The left ventricle has no regional  wall motion abnormalities. There is mild left ventricular hypertrophy.  Left ventricular diastolic parameters  were normal.   2. Right ventricular systolic function is normal. The right ventricular  size is normal. There is normal pulmonary artery systolic pressure.   3. The mitral valve is grossly normal.  Trivial mitral valve  regurgitation.   4. The aortic valve is tricuspid. Aortic valve regurgitation is not  visualized.   5. The inferior vena cava is normal in size with greater than 50%  respiratory variability, suggesting right atrial pressure of 3 mmHg.    24 Hour Holter monitor 12/11/17: Normal sinus rhythm with normal HR range Rare isolated PACs and PVCs No junctional rhythm seen    Past Medical History:  Diagnosis Date   Anxiety    Complication of anesthesia    Hypertension    Microscopic hematuria 08/2003   negative work up with Dr. Bosie Bye   Osteopenia    PONV (postoperative nausea and vomiting)    Vitamin D  deficiency     Past Surgical History:  Procedure Laterality Date   CESAREAN SECTION     x2   ROTATOR CUFF REPAIR Left 12/12/2005   TUBAL LIGATION Bilateral 10/14/1989    MEDICATIONS:  cetirizine  (ZYRTEC ) 10 MG chewable tablet   olopatadine (PATADAY) 0.1 % ophthalmic solution   acetaminophen (TYLENOL) 500 MG tablet   albuterol  (VENTOLIN  HFA) 108 (90 Base) MCG/ACT inhaler   amLODipine  (NORVASC ) 2.5 MG tablet   amLODipine  (NORVASC ) 5 MG tablet   budesonide -formoterol  (SYMBICORT ) 160-4.5 MCG/ACT inhaler   calcium carbonate (TUMS - DOSED IN MG ELEMENTAL CALCIUM) 500 MG chewable tablet   fluticasone  (FLONASE ) 50 MCG/ACT nasal spray   Multiple Vitamin (MULTIVITAMIN) tablet   Polyethyl Glycol-Propyl Glycol (SYSTANE) 0.4-0.3 % SOLN   Spacer/Aero-Holding Chambers (AEROCHAMBER MV) inhaler   traZODone  (DESYREL ) 50 MG tablet   VITAMIN D  PO   No current facility-administered medications for this encounter.    Ella Gun, PA-C Surgical Short Stay/Anesthesiology [facility] Phone [phone] [facility] Phone [phone] 02/12/2024 3:58 PM

## 2024-02-12 NOTE — Anesthesia Preprocedure Evaluation (Signed)
Anesthesia Evaluation    Airway        Dental   Pulmonary           Cardiovascular hypertension,      Neuro/Psych    GI/Hepatic   Endo/Other    Renal/GU      Musculoskeletal   Abdominal   Peds  Hematology   Anesthesia Other Findings   Reproductive/Obstetrics                             Anesthesia Physical Anesthesia Plan  ASA:   Anesthesia Plan:    Post-op Pain Management:    Induction:   PONV Risk Score and Plan:   Airway Management Planned:   Additional Equipment:   Intra-op Plan:   Post-operative Plan:   Informed Consent:   Plan Discussed with:   Anesthesia Plan Comments: (PAT note written by Tambria Pfannenstiel, PA-C.  )       Anesthesia Quick Evaluation  

## 2024-02-12 NOTE — Telephone Encounter (Signed)
   Pre-operative Risk Assessment    Patient Name: Lisa Benjamin  DOB: November 17, 1955 MRN: [id number]   Date of last office visit: 03/27/22 with Dr. Swaziland Date of next office visit: TBD   Request for Surgical Clearance    Procedure:   LUMBAR FUNSION  Date of Surgery:  Clearance 02/19/24                                Surgeon:  DR. Ellery Guthrie Surgeon's Group or Practice Name:  [facility]  Phone number:  [phone] Fax number:  [phone]   Type of Clearance Requested:   - Medical    Type of Anesthesia:  General    Additional requests/questions:    SignedVira Grieves   02/12/2024, 9:08 AM

## 2024-02-12 NOTE — Telephone Encounter (Signed)
 I left a message for the patient to call our office to schedule an office visit for pre-op.

## 2024-02-12 NOTE — Telephone Encounter (Signed)
   Name: Lisa Benjamin  DOB: 01-31-1956  MRN: [id number]  Primary Cardiologist: Peter Swaziland, MD Last OV: 03/2022  Chart reviewed as part of pre-operative protocol coverage. Because of Lisa Benjamin's past medical history and time since last visit, she will require a follow-up in-office visit in order to better assess preoperative cardiovascular risk.  Pre-op covering staff: - Please schedule appointment and call patient to inform them. If patient already had an upcoming appointment within acceptable timeframe, please add "pre-op clearance" to the appointment notes so provider is aware. - Please contact requesting surgeon's office via preferred method (i.e, phone, fax) to inform them of need for appointment prior to surgery.  Lisa Exley D Kanye Depree, NP  02/12/2024, 10:51 AM

## 2024-02-13 NOTE — Telephone Encounter (Signed)
 Pt has appt 02/16/24 10:30 with Katlyn West, NP for preop clearance.

## 2024-02-13 NOTE — Telephone Encounter (Signed)
 2nd attempt to reach pt to schedule in office preop appt. Procedure is 02/19/24.

## 2024-02-15 NOTE — Progress Notes (Unsigned)
 Cardiology Office Note    Date:  02/16/2024  ID:  Nimisha Radice, DOB 1956-01-21, MRN [id number] PCP:  Victorio Grave, MD  Cardiologist:  Peter Swaziland, MD  Electrophysiologist:  None   Chief Complaint: Preoperative cardiac evaluation   History of Present Illness: .    Florestela Apkarian is a 68 y.o. female with visit-pertinent history of shoulder palpitations, right bundle branch block, palpitations and asthma. Patient previously followed by Dr. Swaziland, last seen in 2023.  Patient previously seen for a right bundle branch block and palpitations.  Patient echocardiogram in 1998 that was normal except for mild TR.  She previously had an ECG that showed a junctional rhythm and was asymptomatic.  A Holter monitor was completed showing no junctional rhythm with very rare PACs and PVCs.  In 2021 she was seen in the emergency throat with palpitations, EKG showed normal sinus rhythm with PVCs right bundle branch block.  She was given Toprol  XL 25 mg once.  On follow-up she denied any further palpitations.  Echocardiogram in 01/2020 indicated LVEF of 60 to 65%, no RWMA, mild LVH, diastolic parameters were normal, RV systolic function and size was normal, trivial mitral valve regurgitation, aortic valve regurgitation was not visualized and there is no evidence of stenosis.  Today today she presents for preoperative cardiac evaluation.  She reports that she has been doing very well overall.  She denies any chest pain, shortness of breath, increased lower extremity edema, orthopnea or PND.  She denies any dizziness, lightheadedness, presyncope or syncope.  She reports that her palpitations are well-controlled, occur very rarely and only when she gets very little sleep.  Up until last week when she started having increased back pain she was exercising regularly and tolerating very well.  Patient notes that her blood pressure is mildly elevated today, she has been working with her primary care on hypertension  control, notes that she has been on amlodipine  and was having soft blood pressures at home and had been working to adjust her medications.  She is able to achieve greater than 4 METS of activity.  Labwork independently reviewed: 02/11/2024: Sodium 140, potassium 4.4, creatinine 1.11, hemoglobin 12.4, hematocrit 38.6 ROS: .   Today she denies chest pain, shortness of breath, lower extremity edema, fatigue, palpitations, melena, hematuria, hemoptysis, diaphoresis, weakness, presyncope, syncope, orthopnea, and PND.  All other systems are reviewed and otherwise negative. Studies Reviewed: Aaron Aas   EKG:  EKG is ordered today, personally reviewed, demonstrating  EKG Interpretation Date/Time:  Monday Feb 16 2024 10:38:47 EDT Ventricular Rate:  64 PR Interval:  150 QRS Duration:  126 QT Interval:  428 QTC Calculation: 441 R Axis:   117  Text Interpretation: Normal sinus rhythm Right bundle branch block Left posterior fascicular block Bifascicular block Confirmed by Marilu Rylander [phone]) on 02/16/2024 11:00:01 AM   CV Studies: Cardiac studies reviewed are outlined and summarized above. Otherwise please see EMR for full report. Cardiac Studies & Procedures   ______________________________________________________________________________________________     ECHOCARDIOGRAM  ECHOCARDIOGRAM COMPLETE 02/08/2020  Narrative ECHOCARDIOGRAM REPORT    Patient Name:   KHRYSTYNE HENEGAR Date of Exam: 02/08/2020 Medical Rec #:  [id number]      Height:       62.0 in Accession #:    [id number]     Weight:       179.0 lb Date of Birth:  [birth year]     BSA:          1.824 m Patient Age:  63 years       BP:           149/72 mmHg Patient Gender: F              HR:           58 bpm. Exam Location:  [address]  Procedure: 2D Echo, 3D Echo, Cardiac Doppler and Color Doppler  Indications:    R00.2 Palpitations  History:        Patient has no prior history of Echocardiogram examinations. Arrythmias:RBBB and PVC;  Signs/Symptoms:Murmur.  Sonographer:    Juventino Oppenheim RCS Referring Phys: [phone] PETER M Sw[place]iland  IMPRESSIONS   1. Left ventricular ejection fraction, by estimation, is 60 to 65%. The left ventricle has normal function. The left ventricle has no regional wall motion abnormalities. There is mild left ventricular hypertrophy. Left ventricular diastolic parameters were normal. 2. Right ventricular systolic function is normal. The right ventricular size is normal. There is normal pulmonary artery systolic pressure. 3. The mitral valve is grossly normal. Trivial mitral valve regurgitation. 4. The aortic valve is tricuspid. Aortic valve regurgitation is not visualized. 5. The inferior vena cava is normal in size with greater than 50% respiratory variability, suggesting right atrial pressure of 3 mmHg.  FINDINGS Left Ventricle: Left ventricular ejection fraction, by estimation, is 60 to 65%. The left ventricle has normal function. The left ventricle has no regional wall motion abnormalities. The left ventricular internal cavity size was normal in size. There is mild left ventricular hypertrophy. Left ventricular diastolic parameters were normal.  Right Ventricle: The right ventricular size is normal. No increase in right ventricular wall thickness. Right ventricular systolic function is normal. There is normal pulmonary artery systolic pressure. The tricuspid regurgitant velocity is 2.74 m/s, and with an assumed right atrial pressure of 3 mmHg, the estimated right ventricular systolic pressure is 33.0 mmHg.  Left Atrium: Left atrial size was normal in size.  Right Atrium: Right atrial size was normal in size.  Pericardium: There is no evidence of pericardial effusion.  Mitral Valve: The mitral valve is grossly normal. Trivial mitral valve regurgitation.  Tricuspid Valve: The tricuspid valve is grossly normal. Tricuspid valve regurgitation is trivial.  Aortic Valve: The aortic valve is  tricuspid. Aortic valve regurgitation is not visualized.  Pulmonic Valve: The pulmonic valve was normal in structure. Pulmonic valve regurgitation is not visualized.  Aorta: The aortic root and ascending aorta are structurally normal, with no evidence of dilitation.  Venous: The inferior vena cava is normal in size with greater than 50% respiratory variability, suggesting right atrial pressure of 3 mmHg.  IAS/Shunts: No atrial level shunt detected by color flow Doppler.   LEFT VENTRICLE PLAX 2D LVIDd:         3.63 cm  Diastology LVIDs:         2.32 cm  LV e' lateral:   11.20 cm/s LV PW:         1.17 cm  LV E/e' lateral: 8.1 LV IVS:        1.14 cm  LV e' medial:    9.79 cm/s LVOT diam:     1.90 cm  LV E/e' medial:  9.2 LV SV:         69 LV SV Index:   38 LVOT Area:     2.84 cm  3D Volume EF: 3D EF:        62 % LV EDV:       90 ml LV ESV:  35 ml LV SV:        56 ml  RIGHT VENTRICLE RV Basal diam:  3.68 cm RV S prime:     13.20 cm/s TAPSE (M-mode): 2.5 cm RVSP:           33.0 mmHg  LEFT ATRIUM             Index       RIGHT ATRIUM           Index LA diam:        3.10 cm 1.70 cm/m  RA Pressure: 3.00 mmHg LA Vol (A2C):   40.1 ml 21.99 ml/m RA Area:     12.60 cm LA Vol (A4C):   40.8 ml 22.37 ml/m RA Volume:   26.50 ml  14.53 ml/m LA Biplane Vol: 41.1 ml 22.54 ml/m AORTIC VALVE LVOT Vmax:   111.00 cm/s LVOT Vmean:  66.000 cm/s LVOT VTI:    0.245 m  AORTA Ao Root diam: 2.70 cm  MITRAL VALVE               TRICUSPID VALVE MV Area (PHT):             TR Peak grad:   30.0 mmHg Decel Time:                TR Vmax:        274.00 cm/s MV E velocity: 90.20 cm/s  Estimated RAP:  3.00 mmHg MV A velocity: 84.90 cm/s  RVSP:           33.0 mmHg MV E/A ratio:  1.06 SHUNTS Systemic VTI:  0.24 m Systemic Diam: 1.90 cm  Dinah Franco MD Electronically signed by Dinah Franco MD Signature Date/Time: 02/08/2020/5:23:33 PM    Final           ______________________________________________________________________________________________       Current Reported Medications:.    Current Meds  Medication Sig   acetaminophen (TYLENOL) 500 MG tablet Take 500 mg by mouth every 6 (six) hours as needed (pain.).   albuterol  (VENTOLIN  HFA) 108 (90 Base) MCG/ACT inhaler INHALE 1 TO 2 PUFFS BY MOUTH EVERY 6 HOURS AS NEEDED FOR WHEEZING OR  SHORTNESS  OF  BREATH   amLODipine  (NORVASC ) 2.5 MG tablet Take 1 tablet (2.5 mg total) by mouth daily. (Patient taking differently: Take 2.5 mg by mouth daily as needed (blood pressure greater or equal to 135/85).)   amLODipine  (NORVASC ) 5 MG tablet Take 1 tablet (5 mg total) by mouth daily. (Patient taking differently: Take 2.5 mg by mouth daily as needed (blood pressure greater or equal to 135/85).)   budesonide -formoterol  (SYMBICORT ) 160-4.5 MCG/ACT inhaler Inhale 2 puffs into the lungs 2 (two) times daily. (Patient taking differently: Inhale 2 puffs into the lungs 2 (two) times daily as needed (respiratory issues.).)   calcium carbonate (TUMS - DOSED IN MG ELEMENTAL CALCIUM) 500 MG chewable tablet Chew 2 tablets by mouth daily.    cetirizine  (ZYRTEC ) 10 MG chewable tablet Chew 10 mg by mouth as needed for allergies.   fluticasone  (FLONASE ) 50 MCG/ACT nasal spray Place 1 spray into both nostrils daily as needed for allergies.   Multiple Vitamin (MULTIVITAMIN) tablet Take 1 tablet by mouth in the morning. Women's Multivitamin   olopatadine (PATADAY) 0.1 % ophthalmic solution Place 1 drop into both eyes 2 (two) times daily.   Polyethyl Glycol-Propyl Glycol (SYSTANE) 0.4-0.3 % SOLN Place 1-2 drops into both eyes 3 (three) times daily as needed (allergy eyes.).   Spacer/Aero-Holding  Chambers (AEROCHAMBER MV) inhaler Use as instructed   traZODone  (DESYREL ) 50 MG tablet Take 1/2-2 tablets (25-100 mg total) by mouth at bedtime as needed for insomnia   [DISCONTINUED] VITAMIN D  PO Take 2,000 Units by mouth daily  in the afternoon.    Physical Exam:    VS:  BP (!) 142/60   Pulse 64   Ht 5\' 2"  (1.575 m)   Wt 170 lb 6.4 oz (77.3 kg)   LMP 06/14/2010 (Approximate)   SpO2 97%   BMI 31.17 kg/m    Wt Readings from Last 3 Encounters:  02/16/24 170 lb 6.4 oz (77.3 kg)  02/11/24 167 lb (75.8 kg)  12/09/22 181 lb 9.6 oz (82.4 kg)    GEN: Well nourished, well developed in no acute distress NECK: No JVD; No carotid bruits CARDIAC: RRR, no murmurs, rubs, gallops RESPIRATORY:  Clear to auscultation without rales, wheezing or rhonchi  ABDOMEN: Soft, non-tender, non-distended EXTREMITIES:  No edema; No acute deformity     Asessement and Plan:.    Preoperative cardiac evaluation: Lumbar fusion scheduled for 02/19/2024 with Dr. Ellery Guthrie. Ms. Loayza perioperative risk of a major cardiac event is 0.4% according to the Revised Cardiac Risk Index (RCRI).  Therefore, she is at low risk for perioperative complications.   Her functional capacity is excellent at 8.33 METs according to the Duke Activity Status Index (DASI). Recommendations: According to ACC/AHA guidelines, no further cardiovascular testing needed.  The patient may proceed to surgery at acceptable risk.    RBBB/palpitations: Noted to be chronic. She denies any palpitations, dizziness, lightheadedness, presyncope or syncope. Stable on EKG.   Asthma: Patient denies any exacerbations this year.   Disposition: F/u with Dr. Swaziland as needed per patient request.   Signed, Wilberth Damon D Jamese Trauger, NP

## 2024-02-16 ENCOUNTER — Ambulatory Visit: Attending: Cardiology | Admitting: Cardiology

## 2024-02-16 ENCOUNTER — Encounter: Payer: Self-pay | Admitting: Cardiology

## 2024-02-16 VITALS — BP 142/60 | HR 64 | Ht 62.0 in | Wt 170.4 lb

## 2024-02-16 DIAGNOSIS — I4510 Unspecified right bundle-branch block: Secondary | ICD-10-CM | POA: Diagnosis not present

## 2024-02-16 DIAGNOSIS — I493 Ventricular premature depolarization: Secondary | ICD-10-CM

## 2024-02-16 DIAGNOSIS — Z01810 Encounter for preprocedural cardiovascular examination: Secondary | ICD-10-CM | POA: Diagnosis not present

## 2024-02-16 NOTE — Patient Instructions (Signed)
 Medication Instructions:  No changes *If you need a refill on your cardiac medications before your next appointment, please call your pharmacy*  Lab Work: No labs  Testing/Procedures: No testing  Follow-Up: At [facility], you and your health needs are our priority.  As part of our continuing mission to provide you with exceptional heart care, our providers are all part of one team.  This team includes your primary Cardiologist (physician) and Advanced Practice Providers or APPs (Physician Assistants and Nurse Practitioners) who all work together to provide you with the care you need, when you need it.  Your next appointment:   As needed  Provider:   Peter Swaziland, MD    We recommend signing up for the patient portal called "MyChart".  Sign up information is provided on this After Visit Summary.  MyChart is used to connect with patients for Virtual Visits (Telemedicine).  Patients are able to view lab/test results, encounter notes, upcoming appointments, etc.  Non-urgent messages can be sent to your provider as well.   To learn more about what you can do with MyChart, go to [email or web address].

## 2024-02-19 ENCOUNTER — Encounter (HOSPITAL_COMMUNITY): Payer: Self-pay | Admitting: Physician Assistant

## 2024-02-19 ENCOUNTER — Inpatient Hospital Stay (HOSPITAL_COMMUNITY): Admission: RE | Admit: 2024-02-19 | Source: Home / Self Care | Admitting: Neurological Surgery

## 2024-02-19 ENCOUNTER — Encounter (HOSPITAL_COMMUNITY): Admission: RE | Payer: Self-pay | Source: Home / Self Care

## 2024-02-19 SURGERY — ANTERIOR LATERAL LUMBAR FUSION 1 LEVEL
Anesthesia: General | Laterality: Left

## 2024-03-01 DIAGNOSIS — M545 Low back pain, unspecified: Secondary | ICD-10-CM | POA: Diagnosis not present

## 2024-03-01 DIAGNOSIS — M4316 Spondylolisthesis, lumbar region: Secondary | ICD-10-CM | POA: Diagnosis not present

## 2024-03-03 DIAGNOSIS — M4316 Spondylolisthesis, lumbar region: Secondary | ICD-10-CM | POA: Diagnosis not present

## 2024-03-03 DIAGNOSIS — M545 Low back pain, unspecified: Secondary | ICD-10-CM | POA: Diagnosis not present

## 2024-03-05 DIAGNOSIS — Z6830 Body mass index (BMI) 30.0-30.9, adult: Secondary | ICD-10-CM | POA: Diagnosis not present

## 2024-03-05 DIAGNOSIS — M4316 Spondylolisthesis, lumbar region: Secondary | ICD-10-CM | POA: Diagnosis not present

## 2024-03-10 DIAGNOSIS — M545 Low back pain, unspecified: Secondary | ICD-10-CM | POA: Diagnosis not present

## 2024-03-10 DIAGNOSIS — M4316 Spondylolisthesis, lumbar region: Secondary | ICD-10-CM | POA: Diagnosis not present

## 2024-03-13 DIAGNOSIS — F411 Generalized anxiety disorder: Secondary | ICD-10-CM | POA: Diagnosis not present

## 2024-03-17 DIAGNOSIS — M4316 Spondylolisthesis, lumbar region: Secondary | ICD-10-CM | POA: Diagnosis not present

## 2024-03-17 DIAGNOSIS — M545 Low back pain, unspecified: Secondary | ICD-10-CM | POA: Diagnosis not present

## 2024-03-23 ENCOUNTER — Other Ambulatory Visit (HOSPITAL_COMMUNITY): Payer: Self-pay

## 2024-03-23 DIAGNOSIS — F419 Anxiety disorder, unspecified: Secondary | ICD-10-CM | POA: Diagnosis not present

## 2024-03-23 DIAGNOSIS — N1831 Chronic kidney disease, stage 3a: Secondary | ICD-10-CM | POA: Diagnosis not present

## 2024-03-23 DIAGNOSIS — I129 Hypertensive chronic kidney disease with stage 1 through stage 4 chronic kidney disease, or unspecified chronic kidney disease: Secondary | ICD-10-CM | POA: Diagnosis not present

## 2024-03-23 DIAGNOSIS — E669 Obesity, unspecified: Secondary | ICD-10-CM | POA: Diagnosis not present

## 2024-03-23 MED ORDER — AMLODIPINE BESYLATE 2.5 MG PO TABS
2.5000 mg | ORAL_TABLET | Freq: Every day | ORAL | 1 refills | Status: DC
  Filled 2024-03-23: qty 90, 90d supply, fill #0

## 2024-03-23 MED ORDER — DIAZEPAM 2 MG PO TABS
1.0000 mg | ORAL_TABLET | Freq: Every day | ORAL | 0 refills | Status: AC | PRN
  Filled 2024-03-23: qty 10, 10d supply, fill #0

## 2024-03-31 ENCOUNTER — Other Ambulatory Visit (HOSPITAL_COMMUNITY): Payer: Self-pay

## 2024-04-01 DIAGNOSIS — M545 Low back pain, unspecified: Secondary | ICD-10-CM | POA: Diagnosis not present

## 2024-04-01 DIAGNOSIS — M4316 Spondylolisthesis, lumbar region: Secondary | ICD-10-CM | POA: Diagnosis not present

## 2024-04-12 DIAGNOSIS — I129 Hypertensive chronic kidney disease with stage 1 through stage 4 chronic kidney disease, or unspecified chronic kidney disease: Secondary | ICD-10-CM | POA: Diagnosis not present

## 2024-04-12 DIAGNOSIS — F411 Generalized anxiety disorder: Secondary | ICD-10-CM | POA: Diagnosis not present

## 2024-04-12 DIAGNOSIS — J4540 Moderate persistent asthma, uncomplicated: Secondary | ICD-10-CM | POA: Diagnosis not present

## 2024-04-12 DIAGNOSIS — N1831 Chronic kidney disease, stage 3a: Secondary | ICD-10-CM | POA: Diagnosis not present

## 2024-04-14 DIAGNOSIS — M545 Low back pain, unspecified: Secondary | ICD-10-CM | POA: Diagnosis not present

## 2024-04-14 DIAGNOSIS — M4316 Spondylolisthesis, lumbar region: Secondary | ICD-10-CM | POA: Diagnosis not present

## 2024-04-21 DIAGNOSIS — M545 Low back pain, unspecified: Secondary | ICD-10-CM | POA: Diagnosis not present

## 2024-04-21 DIAGNOSIS — M4316 Spondylolisthesis, lumbar region: Secondary | ICD-10-CM | POA: Diagnosis not present

## 2024-04-28 DIAGNOSIS — M4316 Spondylolisthesis, lumbar region: Secondary | ICD-10-CM | POA: Diagnosis not present

## 2024-04-28 DIAGNOSIS — M545 Low back pain, unspecified: Secondary | ICD-10-CM | POA: Diagnosis not present

## 2024-05-04 DIAGNOSIS — M545 Low back pain, unspecified: Secondary | ICD-10-CM | POA: Diagnosis not present

## 2024-05-04 DIAGNOSIS — M4316 Spondylolisthesis, lumbar region: Secondary | ICD-10-CM | POA: Diagnosis not present

## 2024-05-13 DIAGNOSIS — I129 Hypertensive chronic kidney disease with stage 1 through stage 4 chronic kidney disease, or unspecified chronic kidney disease: Secondary | ICD-10-CM | POA: Diagnosis not present

## 2024-05-13 DIAGNOSIS — N1831 Chronic kidney disease, stage 3a: Secondary | ICD-10-CM | POA: Diagnosis not present

## 2024-05-13 DIAGNOSIS — J4540 Moderate persistent asthma, uncomplicated: Secondary | ICD-10-CM | POA: Diagnosis not present

## 2024-05-13 DIAGNOSIS — F411 Generalized anxiety disorder: Secondary | ICD-10-CM | POA: Diagnosis not present

## 2024-05-18 DIAGNOSIS — M545 Low back pain, unspecified: Secondary | ICD-10-CM | POA: Diagnosis not present

## 2024-05-18 DIAGNOSIS — M4316 Spondylolisthesis, lumbar region: Secondary | ICD-10-CM | POA: Diagnosis not present

## 2024-06-01 ENCOUNTER — Other Ambulatory Visit (HOSPITAL_COMMUNITY): Payer: Self-pay

## 2024-06-02 DIAGNOSIS — M4316 Spondylolisthesis, lumbar region: Secondary | ICD-10-CM | POA: Diagnosis not present

## 2024-06-02 DIAGNOSIS — M545 Low back pain, unspecified: Secondary | ICD-10-CM | POA: Diagnosis not present

## 2024-06-08 DIAGNOSIS — M545 Low back pain, unspecified: Secondary | ICD-10-CM | POA: Diagnosis not present

## 2024-06-08 DIAGNOSIS — M4316 Spondylolisthesis, lumbar region: Secondary | ICD-10-CM | POA: Diagnosis not present

## 2024-06-10 ENCOUNTER — Other Ambulatory Visit (HOSPITAL_COMMUNITY): Payer: Self-pay

## 2024-06-13 DIAGNOSIS — N1831 Chronic kidney disease, stage 3a: Secondary | ICD-10-CM | POA: Diagnosis not present

## 2024-06-13 DIAGNOSIS — F411 Generalized anxiety disorder: Secondary | ICD-10-CM | POA: Diagnosis not present

## 2024-06-13 DIAGNOSIS — I129 Hypertensive chronic kidney disease with stage 1 through stage 4 chronic kidney disease, or unspecified chronic kidney disease: Secondary | ICD-10-CM | POA: Diagnosis not present

## 2024-06-13 DIAGNOSIS — J4540 Moderate persistent asthma, uncomplicated: Secondary | ICD-10-CM | POA: Diagnosis not present

## 2024-06-24 DIAGNOSIS — I129 Hypertensive chronic kidney disease with stage 1 through stage 4 chronic kidney disease, or unspecified chronic kidney disease: Secondary | ICD-10-CM | POA: Diagnosis not present

## 2024-06-24 DIAGNOSIS — F419 Anxiety disorder, unspecified: Secondary | ICD-10-CM | POA: Diagnosis not present

## 2024-06-24 DIAGNOSIS — N1831 Chronic kidney disease, stage 3a: Secondary | ICD-10-CM | POA: Diagnosis not present

## 2024-07-07 DIAGNOSIS — M4316 Spondylolisthesis, lumbar region: Secondary | ICD-10-CM | POA: Diagnosis not present

## 2024-07-07 DIAGNOSIS — Z6829 Body mass index (BMI) 29.0-29.9, adult: Secondary | ICD-10-CM | POA: Diagnosis not present

## 2024-07-13 DIAGNOSIS — J4540 Moderate persistent asthma, uncomplicated: Secondary | ICD-10-CM | POA: Diagnosis not present

## 2024-07-13 DIAGNOSIS — I129 Hypertensive chronic kidney disease with stage 1 through stage 4 chronic kidney disease, or unspecified chronic kidney disease: Secondary | ICD-10-CM | POA: Diagnosis not present

## 2024-07-13 DIAGNOSIS — F411 Generalized anxiety disorder: Secondary | ICD-10-CM | POA: Diagnosis not present

## 2024-07-13 DIAGNOSIS — N1831 Chronic kidney disease, stage 3a: Secondary | ICD-10-CM | POA: Diagnosis not present

## 2024-07-20 ENCOUNTER — Other Ambulatory Visit (HOSPITAL_COMMUNITY): Payer: Self-pay

## 2024-07-20 ENCOUNTER — Encounter: Payer: Self-pay | Admitting: Pulmonary Disease

## 2024-07-20 ENCOUNTER — Ambulatory Visit: Admitting: Pulmonary Disease

## 2024-07-20 VITALS — BP 128/72 | HR 65 | Ht 62.0 in | Wt 163.4 lb

## 2024-07-20 DIAGNOSIS — J4520 Mild intermittent asthma, uncomplicated: Secondary | ICD-10-CM

## 2024-07-20 MED ORDER — BUDESONIDE-FORMOTEROL FUMARATE 160-4.5 MCG/ACT IN AERO
2.0000 | INHALATION_SPRAY | Freq: Two times a day (BID) | RESPIRATORY_TRACT | 5 refills | Status: AC
  Filled 2024-07-20: qty 10.2, 30d supply, fill #0
  Filled 2024-10-09: qty 10.2, 30d supply, fill #1

## 2024-07-20 MED ORDER — ALBUTEROL SULFATE HFA 108 (90 BASE) MCG/ACT IN AERS
1.0000 | INHALATION_SPRAY | RESPIRATORY_TRACT | 5 refills | Status: AC | PRN
  Filled 2024-07-20: qty 6.7, 17d supply, fill #0
  Filled 2024-10-09: qty 6.7, 17d supply, fill #1

## 2024-07-20 NOTE — Patient Instructions (Signed)
 Use albuterol  1-2 puffs every 4-6 hours as needed  If albuterol  use becomes frequent or multiple days in a row then incorporate Symbicort  inhaler 1-2 puffs every 12 hours  Follow up in 1 year, call sooner if needed

## 2024-07-20 NOTE — Progress Notes (Unsigned)
 Synopsis: Referred in March 2022 by Tobey Medley, PA for asthma  Subjective:   PATIENT ID: Lisa Benjamin GENDER: female DOB: [birth year], MRN: [id number]  HPI  No chief complaint on file.  Lisa Benjamin is a 68 year old woman, never smoker who returns to pulmonary clinic for asthma follow up.   She has been doing well since last year but has been having increase in cough symptoms recently. No mucous production, fevers or chills. She has not been using her albuterol  frequently.  OV 12/05/2021 She has been doing well since last visit and has not required Symbicort  inhaler as needed.  She is using albuterol  inhaler very infrequently at this time.  She denies any cough, wheezing or shortness of breath at this time.  She denies issues with seasonal allergies.  OV 04/20/21 Patient is doing significantly better since last visit.  She has been on Symbicort  2 puffs twice daily and as needed albuterol .  She reports increased use of albuterol  during the month of June due to the hot humid weather where she used it 3-4 times.  She otherwise denies any nighttime awakenings.  She denies any cough, wheezing or shortness of breath at this time.  OV 01/02/21 She reports having history of asthma in her childhood which improved after she was 16. She reports a couple episodes over recent years where she required steroids and antibiotics for bouts of bronchitis.   She was ill with covid 19 in early January and since then has been having issues with wheezing, cough and exertional dyspnea. She has been treated with 3 rounds of steroids, the most recent being earlier this month. She reports feeling better after this third round. She is using symbicort  160-4.22mcg 2 puffs twice daily and as needed albuterol . She continues to experience exeritonal dyspnea with walking. She was planning to participate in a running race this July and was hoping to start training for that soon.   She denies lower extremity edema, redness  or pain. She denies chest pain or discomfort.   Past Medical History:  Diagnosis Date   Anxiety    Complication of anesthesia    Hypertension    Microscopic hematuria 08/2003   negative work up with Dr. Alline   Osteopenia    PONV (postoperative nausea and vomiting)    Vitamin D  deficiency      Family History  Problem Relation Age of Onset   Breast cancer Mother 72   Diabetes Mother    Colon cancer Mother    Heart failure Father        CHF   COPD Father    Diabetes Brother    Hypertension Sister        maybe some BP issues   Diabetes Sister    Heart disease Brother 31       angioplasty   Colon cancer Maternal Grandmother      Social History   Socioeconomic History   Marital status: Married    Spouse name: Not on file   Number of children: 2   Years of education: Not on file   Highest education level: Not on file  Occupational History    Employer: [employer]  Tobacco Use   Smoking status: Never   Smokeless tobacco: Never  Vaping Use   Vaping status: Never Used  Substance and Sexual Activity   Alcohol use: Not Currently    Comment: occasionally   Drug use: No   Sexual activity: Yes    Partners:  Male    Birth control/protection: Post-menopausal    Comment: medicare questions reviewed:  all negative  Other Topics Concern   Not on file  Social History Narrative   Not on file   Social Drivers of Health   Financial Resource Strain: Not on file  Food Insecurity: Not on file  Transportation Needs: Not on file  Physical Activity: Not on file  Stress: Not on file  Social Connections: Unknown (02/26/2022)   [facility]   Social Network    Social Network: Not on file  Intimate Partner Violence: Unknown (01/18/2022)   Received from [facility]   HITS    Physically Hurt: Not on file    Insult or Talk Down To: Not on file    Threaten Physical Harm: Not on file    Scream or Curse: Not on file     No Known Allergies    Outpatient Medications Prior to Visit  Medication Sig Dispense Refill   acetaminophen (TYLENOL) 500 MG tablet Take 500 mg by mouth every 6 (six) hours as needed (pain.).     amLODipine  (NORVASC ) 2.5 MG tablet Take 1 tablet (2.5 mg total) by mouth daily. (Patient taking differently: Take 2.5 mg by mouth daily as needed (blood pressure greater or equal to 135/85).) 30 tablet 5   amLODipine  (NORVASC ) 2.5 MG tablet Take 1 tablet (2.5 mg total) by mouth daily. 90 tablet 1   calcium carbonate (TUMS - DOSED IN MG ELEMENTAL CALCIUM) 500 MG chewable tablet Chew 2 tablets by mouth daily.      cetirizine  (ZYRTEC ) 10 MG chewable tablet Chew 10 mg by mouth as needed for allergies.     diazepam  (VALIUM ) 2 MG tablet Take 0.5-1 tablets (1-2 mg total) by mouth daily as needed for anxiety. 10 tablet 0   fluticasone  (FLONASE ) 50 MCG/ACT nasal spray Place 1 spray into both nostrils daily as needed for allergies.     Multiple Vitamin (MULTIVITAMIN) tablet Take 1 tablet by mouth in the morning. Women's Multivitamin     olopatadine (PATADAY) 0.1 % ophthalmic solution Place 1 drop into both eyes 2 (two) times daily.     Polyethyl Glycol-Propyl Glycol (SYSTANE) 0.4-0.3 % SOLN Place 1-2 drops into both eyes 3 (three) times daily as needed (allergy eyes.).     Spacer/Aero-Holding Chambers (AEROCHAMBER MV) inhaler Use as instructed 1 each 0   traZODone  (DESYREL ) 50 MG tablet Take 1/2-2 tablets (25-100 mg total) by mouth at bedtime as needed for insomnia 30 tablet 5   VITAMIN D , CHOLECALCIFEROL, PO VIT D     albuterol  (VENTOLIN  HFA) 108 (90 Base) MCG/ACT inhaler INHALE 1 TO 2 PUFFS BY MOUTH EVERY 6 HOURS AS NEEDED FOR WHEEZING OR  SHORTNESS  OF  BREATH 18 g 2   budesonide -formoterol  (SYMBICORT ) 160-4.5 MCG/ACT inhaler Inhale 2 puffs into the lungs 2 (two) times daily. (Patient taking differently: Inhale 2 puffs into the lungs 2 (two) times daily as needed (respiratory issues.).) 10.2 g 6   No facility-administered  medications prior to visit.    Review of Systems  Constitutional:  Negative for chills, fever, malaise/fatigue and weight loss.  HENT:  Negative for congestion, sinus pain and sore throat.   Eyes: Negative.   Respiratory:  Positive for cough. Negative for hemoptysis, sputum production, shortness of breath and wheezing.   Cardiovascular:  Negative for chest pain, palpitations, orthopnea, claudication and leg swelling.  Gastrointestinal:  Negative for abdominal pain, heartburn, nausea and vomiting.  Genitourinary: Negative.  Musculoskeletal:  Negative for joint pain and myalgias.  Skin:  Negative for rash.  Neurological:  Negative for weakness.  Endo/Heme/Allergies: Negative.   Psychiatric/Behavioral: Negative.      Objective:   Vitals:   07/20/24 0908  BP: 128/72  Pulse: 65  SpO2: 99%  Weight: 163 lb 6.4 oz (74.1 kg)  Height: 5' 2 (1.575 m)   Physical Exam Constitutional:      General: She is not in acute distress.    Appearance: Normal appearance. She is not ill-appearing.  HENT:     Head: Normocephalic and atraumatic.  Eyes:     General: No scleral icterus.    Conjunctiva/sclera: Conjunctivae normal.     Pupils: Pupils are equal, round, and reactive to light.  Cardiovascular:     Rate and Rhythm: Normal rate and regular rhythm.     Pulses: Normal pulses.     Heart sounds: Normal heart sounds. No murmur heard. Pulmonary:     Effort: Pulmonary effort is normal.     Breath sounds: Normal breath sounds. No wheezing, rhonchi or rales.  Musculoskeletal:     Right lower leg: No edema.     Left lower leg: No edema.  Skin:    General: Skin is warm and dry.  Neurological:     General: No focal deficit present.     Mental Status: She is alert.    CBC    Component Value Date/Time   WBC 4.9 02/11/2024 1100   RBC 4.24 02/11/2024 1100   HGB 12.4 02/11/2024 1100   HGB 12.0 09/27/2013 1346   HCT 38.6 02/11/2024 1100   PLT 322 02/11/2024 1100   MCV 91.0 02/11/2024  1100   MCH 29.2 02/11/2024 1100   MCHC 32.1 02/11/2024 1100   RDW 14.0 02/11/2024 1100      Latest Ref Rng & Units 02/11/2024   11:00 AM 01/18/2020    9:48 AM  BMP  Glucose 70 - 99 mg/dL 96  891   BUN 8 - 23 mg/dL 14  17   Creatinine 9.55 - 1.00 mg/dL 8.88  8.78   Sodium 864 - 145 mmol/L 140  141   Potassium 3.5 - 5.1 mmol/L 4.4  3.9   Chloride 98 - 111 mmol/L 107  106   CO2 22 - 32 mmol/L 27  26   Calcium 8.9 - 10.3 mg/dL 9.4  9.4    Chest imaging: CXR 12/09/22 Stable cardiomediastinal silhouette. Both lungs are clear. The visualized skeletal structures are unremarkable.  CXR 12/12/20 The cardiomediastinal contours are normal. The lungs are clear. Pulmonary vasculature is normal. No consolidation, pleural effusion, or pneumothorax. No acute osseous abnormalities are seen.  PFT:    Latest Ref Rng & Units 04/20/2021   10:47 AM  PFT Results  FVC-Pre L 2.77   FVC-Predicted Pre % 122   FVC-Post L 2.67   FVC-Predicted Post % 117   Pre FEV1/FVC % % 91   Post FEV1/FCV % % 90   FEV1-Pre L 2.51   FEV1-Predicted Pre % 142   FEV1-Post L 2.41   DLCO uncorrected ml/min/mmHg 22.98   DLCO UNC% % 124   DLCO corrected ml/min/mmHg 22.98   DLCO COR %Predicted % 124   DLVA Predicted % 129   PFT 04/20/21: within normal limits  Echo 02/08/20:  1. Left ventricular ejection fraction, by estimation, is 60 to 65%. The  left ventricle has normal function. The left ventricle has no regional  wall motion abnormalities. There is mild  left ventricular hypertrophy.  Left ventricular diastolic parameters  were normal.   2. Right ventricular systolic function is normal. The right ventricular  size is normal. There is normal pulmonary artery systolic pressure.   3. The mitral valve is grossly normal. Trivial mitral valve  regurgitation.   4. The aortic valve is tricuspid. Aortic valve regurgitation is not  visualized.   5. The inferior vena cava is normal in size with greater than 50%  respiratory  variability, suggesting right atrial pressure of 3 mmHg.  24Hr Holter Monitor 12/11/17 Normal sinus rhythm with normal HR range Rare isolated PACs and PVCs No junctional rhythm seen  Assessment & Plan:   Mild intermittent asthma without complication - Plan: albuterol  (VENTOLIN  HFA) 108 (90 Base) MCG/ACT inhaler, budesonide -formoterol  (SYMBICORT ) 160-4.5 MCG/ACT inhaler  Discussion: Lisa Benjamin is a 68 year old woman, never smoker who returns to pulmonary clinic for asthma follow up.   She has mild intermittent asthma.  She is doing well on as needed albuterol . She is to use albuterol  scheduled every 6 hours over the next couple of days due to her increase in cough symptoms. She is to call us  if her symptoms progress and we will likely resume ICS/LABA inhaler therapy for her.   Chest x-ray is unremarkable today.  Follow-up in 1 year.  Dorn Chill, MD [facility] Office: [phone]   Current Outpatient Medications:    acetaminophen (TYLENOL) 500 MG tablet, Take 500 mg by mouth every 6 (six) hours as needed (pain.)., Disp: , Rfl:    amLODipine  (NORVASC ) 2.5 MG tablet, Take 1 tablet (2.5 mg total) by mouth daily. (Patient taking differently: Take 2.5 mg by mouth daily as needed (blood pressure greater or equal to 135/85).), Disp: 30 tablet, Rfl: 5   amLODipine  (NORVASC ) 2.5 MG tablet, Take 1 tablet (2.5 mg total) by mouth daily., Disp: 90 tablet, Rfl: 1   calcium carbonate (TUMS - DOSED IN MG ELEMENTAL CALCIUM) 500 MG chewable tablet, Chew 2 tablets by mouth daily. , Disp: , Rfl:    cetirizine  (ZYRTEC ) 10 MG chewable tablet, Chew 10 mg by mouth as needed for allergies., Disp: , Rfl:    diazepam  (VALIUM ) 2 MG tablet, Take 0.5-1 tablets (1-2 mg total) by mouth daily as needed for anxiety., Disp: 10 tablet, Rfl: 0   fluticasone  (FLONASE ) 50 MCG/ACT nasal spray, Place 1 spray into both nostrils daily as needed for allergies., Disp: , Rfl:    Multiple Vitamin  (MULTIVITAMIN) tablet, Take 1 tablet by mouth in the morning. Women's Multivitamin, Disp: , Rfl:    olopatadine (PATADAY) 0.1 % ophthalmic solution, Place 1 drop into both eyes 2 (two) times daily., Disp: , Rfl:    Polyethyl Glycol-Propyl Glycol (SYSTANE) 0.4-0.3 % SOLN, Place 1-2 drops into both eyes 3 (three) times daily as needed (allergy eyes.)., Disp: , Rfl:    Spacer/Aero-Holding Chambers (AEROCHAMBER MV) inhaler, Use as instructed, Disp: 1 each, Rfl: 0   traZODone  (DESYREL ) 50 MG tablet, Take 1/2-2 tablets (25-100 mg total) by mouth at bedtime as needed for insomnia, Disp: 30 tablet, Rfl: 5   VITAMIN D , CHOLECALCIFEROL, PO, VIT D, Disp: , Rfl:    albuterol  (VENTOLIN  HFA) 108 (90 Base) MCG/ACT inhaler, Inhale 1-2 puffs into the lungs every 4 (four) hours as needed for wheezing or shortness of breath., Disp: 18 g, Rfl: 5   budesonide -formoterol  (SYMBICORT ) 160-4.5 MCG/ACT inhaler, Inhale 2 puffs into the lungs 2 (two) times daily., Disp: 10.2 g, Rfl: 5

## 2024-07-21 ENCOUNTER — Encounter: Payer: Self-pay | Admitting: Pulmonary Disease

## 2024-07-26 ENCOUNTER — Other Ambulatory Visit (HOSPITAL_COMMUNITY): Payer: Self-pay

## 2024-07-26 ENCOUNTER — Other Ambulatory Visit: Payer: Self-pay | Admitting: Family Medicine

## 2024-07-26 DIAGNOSIS — M48062 Spinal stenosis, lumbar region with neurogenic claudication: Secondary | ICD-10-CM | POA: Diagnosis not present

## 2024-07-26 DIAGNOSIS — R0989 Other specified symptoms and signs involving the circulatory and respiratory systems: Secondary | ICD-10-CM

## 2024-07-26 DIAGNOSIS — E559 Vitamin D deficiency, unspecified: Secondary | ICD-10-CM | POA: Diagnosis not present

## 2024-07-26 DIAGNOSIS — N1831 Chronic kidney disease, stage 3a: Secondary | ICD-10-CM | POA: Diagnosis not present

## 2024-07-26 DIAGNOSIS — R3129 Other microscopic hematuria: Secondary | ICD-10-CM | POA: Diagnosis not present

## 2024-07-26 DIAGNOSIS — Z0000 Encounter for general adult medical examination without abnormal findings: Secondary | ICD-10-CM | POA: Diagnosis not present

## 2024-07-26 DIAGNOSIS — E785 Hyperlipidemia, unspecified: Secondary | ICD-10-CM | POA: Diagnosis not present

## 2024-07-26 DIAGNOSIS — F419 Anxiety disorder, unspecified: Secondary | ICD-10-CM | POA: Diagnosis not present

## 2024-07-26 DIAGNOSIS — J4520 Mild intermittent asthma, uncomplicated: Secondary | ICD-10-CM | POA: Diagnosis not present

## 2024-07-26 DIAGNOSIS — F5101 Primary insomnia: Secondary | ICD-10-CM | POA: Diagnosis not present

## 2024-07-26 DIAGNOSIS — Z23 Encounter for immunization: Secondary | ICD-10-CM | POA: Diagnosis not present

## 2024-07-26 DIAGNOSIS — E669 Obesity, unspecified: Secondary | ICD-10-CM | POA: Diagnosis not present

## 2024-07-26 DIAGNOSIS — I129 Hypertensive chronic kidney disease with stage 1 through stage 4 chronic kidney disease, or unspecified chronic kidney disease: Secondary | ICD-10-CM | POA: Diagnosis not present

## 2024-07-26 MED ORDER — TRAZODONE HCL 50 MG PO TABS
50.0000 mg | ORAL_TABLET | Freq: Every evening | ORAL | 3 refills | Status: AC | PRN
  Filled 2024-07-26: qty 90, 90d supply, fill #0

## 2024-07-28 ENCOUNTER — Inpatient Hospital Stay: Admission: RE | Admit: 2024-07-28 | Discharge: 2024-07-28 | Attending: Family Medicine | Admitting: Family Medicine

## 2024-07-28 DIAGNOSIS — R0989 Other specified symptoms and signs involving the circulatory and respiratory systems: Secondary | ICD-10-CM

## 2024-07-28 DIAGNOSIS — I6523 Occlusion and stenosis of bilateral carotid arteries: Secondary | ICD-10-CM | POA: Diagnosis not present

## 2024-07-29 ENCOUNTER — Encounter: Payer: Self-pay | Admitting: Radiology

## 2024-07-29 ENCOUNTER — Ambulatory Visit: Admitting: Radiology

## 2024-07-29 VITALS — BP 128/80 | HR 64 | Ht 62.5 in | Wt 162.0 lb

## 2024-07-29 DIAGNOSIS — Z01419 Encounter for gynecological examination (general) (routine) without abnormal findings: Secondary | ICD-10-CM | POA: Diagnosis not present

## 2024-07-29 NOTE — Progress Notes (Signed)
   Lisa Benjamin Dec 19, 1955 [id number]   History: Postmenopausal 68 y.o. presents for breast and pelvic. No gyn concerns. Husband has dementia, currently in a care facility, planning to move him home soon.   Gynecologic History Postmenopausal Last Pap: 11/02/18. Results were: normal Last mammogram: 2024 [facility]. Results were: normal Last colonoscopy: 2017 DEXA: 2024 osteopenia   Obstetric History OB History  Gravida Para Term Preterm AB Living  2 2 2  0 0 2  SAB IAB Ectopic Multiple Live Births  0 0 0 0 2    # Outcome Date GA Lbr Len/2nd Weight Sex Type Anes PTL Lv  2 Term 47    M CS-Classical   LIV  1 Term 71    F CS-Classical   LIV     The following portions of the patient's history were reviewed and updated as appropriate: allergies, current medications, past family history, past medical history, past social history, past surgical history, and problem list.  Review of Systems Pertinent items noted in HPI and remainder of comprehensive ROS otherwise negative.  Past medical history, past surgical history, family history and social history were all reviewed and documented in the EPIC chart.  Exam:  Vitals:   07/29/24 1157  BP: 128/80  Pulse: 64  SpO2: 99%  Weight: 162 lb (73.5 kg)  Height: 5' 2.5 (1.588 m)   Body mass index is 29.16 kg/m.  General appearance:  Normal Thyroid :  Symmetrical, normal in size, without palpable masses or nodularity. Respiratory  Auscultation:  Clear without wheezing or rhonchi Cardiovascular  Auscultation:  Regular rate, without rubs, murmurs or gallops  Edema/varicosities:  Not grossly evident Abdominal  Soft,nontender, without masses, guarding or rebound.  Liver/spleen:  No organomegaly noted  Hernia:  None appreciated  Skin  Inspection:  Grossly normal Breasts: Examined lying and sitting.   Right: Without masses, retractions, nipple discharge or axillary adenopathy.   Left: Without masses, retractions, nipple discharge or  axillary adenopathy. Genitourinary   Inguinal/mons:  Normal without inguinal adenopathy  External genitalia:  Normal appearing vulva with no masses, tenderness, or lesions  BUS/Urethra/Skene's glands:  Normal  Vagina:  Normal appearing with normal color and discharge, no lesions. Atrophy: mild   Cervix:  Normal appearing without discharge or lesions  Uterus:  Normal in size, shape and contour.  Midline and mobile, nontender  Adnexa/parametria:     Rt: Normal in size, without masses or tenderness.   Lt: Normal in size, without masses or tenderness.  Anus and perineum: Normal    Lisa Benjamin, CMA present for exam  Assessment/Plan:    1. Well woman exam with routine gynecological exam Paps d/c'd F/u 2 years for breast and pelvic DEXA 2026 Yearly mammo Labs with PCP   Lisa Benjamin B WHNP-BC, 12:07 PM 07/29/2024

## 2024-07-31 DIAGNOSIS — Z1231 Encounter for screening mammogram for malignant neoplasm of breast: Secondary | ICD-10-CM | POA: Diagnosis not present

## 2024-07-31 LAB — HM MAMMOGRAPHY

## 2024-08-05 ENCOUNTER — Ambulatory Visit: Admitting: Pulmonary Disease

## 2024-08-13 DIAGNOSIS — F411 Generalized anxiety disorder: Secondary | ICD-10-CM | POA: Diagnosis not present

## 2024-08-13 DIAGNOSIS — J4540 Moderate persistent asthma, uncomplicated: Secondary | ICD-10-CM | POA: Diagnosis not present

## 2024-08-13 DIAGNOSIS — I129 Hypertensive chronic kidney disease with stage 1 through stage 4 chronic kidney disease, or unspecified chronic kidney disease: Secondary | ICD-10-CM | POA: Diagnosis not present

## 2024-08-13 DIAGNOSIS — N1831 Chronic kidney disease, stage 3a: Secondary | ICD-10-CM | POA: Diagnosis not present

## 2024-09-12 DIAGNOSIS — N1831 Chronic kidney disease, stage 3a: Secondary | ICD-10-CM | POA: Diagnosis not present

## 2024-09-12 DIAGNOSIS — F411 Generalized anxiety disorder: Secondary | ICD-10-CM | POA: Diagnosis not present

## 2024-09-12 DIAGNOSIS — I129 Hypertensive chronic kidney disease with stage 1 through stage 4 chronic kidney disease, or unspecified chronic kidney disease: Secondary | ICD-10-CM | POA: Diagnosis not present

## 2024-09-12 DIAGNOSIS — J4540 Moderate persistent asthma, uncomplicated: Secondary | ICD-10-CM | POA: Diagnosis not present

## 2024-09-17 ENCOUNTER — Other Ambulatory Visit (HOSPITAL_COMMUNITY): Payer: Self-pay

## 2024-09-17 MED ORDER — DIAZEPAM 2 MG PO TABS
1.0000 mg | ORAL_TABLET | Freq: Every day | ORAL | 0 refills | Status: AC | PRN
  Filled 2024-09-17: qty 10, 10d supply, fill #0

## 2024-10-09 ENCOUNTER — Other Ambulatory Visit (HOSPITAL_COMMUNITY): Payer: Self-pay

## 2024-10-11 ENCOUNTER — Other Ambulatory Visit: Payer: Self-pay

## 2024-10-11 ENCOUNTER — Other Ambulatory Visit (HOSPITAL_COMMUNITY): Payer: Self-pay

## 2024-10-11 MED ORDER — AMLODIPINE BESYLATE 2.5 MG PO TABS
2.5000 mg | ORAL_TABLET | Freq: Every day | ORAL | 5 refills | Status: AC
  Filled 2024-10-11: qty 30, 30d supply, fill #0

## 2024-10-28 ENCOUNTER — Other Ambulatory Visit (HOSPITAL_COMMUNITY): Payer: Self-pay
